# Patient Record
Sex: Female | Born: 1935 | Race: White | Hispanic: No | Smoking: Never smoker
Health system: Southern US, Community
[De-identification: ages and names within clinical notes are randomized; demographics above are authoritative.]

## PROBLEM LIST (undated history)

## (undated) DIAGNOSIS — R011 Cardiac murmur, unspecified: Secondary | ICD-10-CM

## (undated) DIAGNOSIS — I2510 Atherosclerotic heart disease of native coronary artery without angina pectoris: Secondary | ICD-10-CM

## (undated) DIAGNOSIS — M1990 Unspecified osteoarthritis, unspecified site: Secondary | ICD-10-CM

## (undated) DIAGNOSIS — E119 Type 2 diabetes mellitus without complications: Secondary | ICD-10-CM

## (undated) DIAGNOSIS — E785 Hyperlipidemia, unspecified: Secondary | ICD-10-CM

## (undated) DIAGNOSIS — E871 Hypo-osmolality and hyponatremia: Secondary | ICD-10-CM

## (undated) DIAGNOSIS — I442 Atrioventricular block, complete: Secondary | ICD-10-CM

## (undated) DIAGNOSIS — F419 Anxiety disorder, unspecified: Secondary | ICD-10-CM

## (undated) DIAGNOSIS — I639 Cerebral infarction, unspecified: Secondary | ICD-10-CM

## (undated) DIAGNOSIS — I119 Hypertensive heart disease without heart failure: Secondary | ICD-10-CM

## (undated) HISTORY — PX: OTHER SURGICAL HISTORY: SHX169

## (undated) HISTORY — PX: TONSILLECTOMY: SHX5217

## (undated) HISTORY — PX: PACEMAKER INSERTION: SHX728

## (undated) HISTORY — DX: Cerebral infarction, unspecified: I63.9

## (undated) HISTORY — PX: CATARACT EXTRACTION, BILATERAL: SHX1313

## (undated) HISTORY — PX: ABDOMINAL HYSTERECTOMY: SUR658

---

## 1998-01-22 ENCOUNTER — Other Ambulatory Visit: Admission: RE | Admit: 1998-01-22 | Discharge: 1998-01-22 | Payer: Self-pay | Admitting: Family Medicine

## 1999-03-08 ENCOUNTER — Encounter: Payer: Self-pay | Admitting: Orthopedic Surgery

## 1999-03-13 ENCOUNTER — Inpatient Hospital Stay (HOSPITAL_COMMUNITY): Admission: RE | Admit: 1999-03-13 | Discharge: 1999-03-19 | Payer: Self-pay | Admitting: Orthopedic Surgery

## 2000-08-18 ENCOUNTER — Encounter: Payer: Self-pay | Admitting: Family Medicine

## 2000-08-18 ENCOUNTER — Encounter: Admission: RE | Admit: 2000-08-18 | Discharge: 2000-08-18 | Payer: Self-pay | Admitting: Family Medicine

## 2001-04-23 ENCOUNTER — Ambulatory Visit (HOSPITAL_COMMUNITY): Admission: RE | Admit: 2001-04-23 | Discharge: 2001-04-23 | Payer: Self-pay | Admitting: Gastroenterology

## 2003-07-29 HISTORY — PX: OTHER SURGICAL HISTORY: SHX169

## 2003-11-20 ENCOUNTER — Inpatient Hospital Stay (HOSPITAL_COMMUNITY): Admission: RE | Admit: 2003-11-20 | Discharge: 2003-11-23 | Payer: Self-pay | Admitting: Orthopedic Surgery

## 2005-02-18 ENCOUNTER — Inpatient Hospital Stay (HOSPITAL_COMMUNITY): Admission: EM | Admit: 2005-02-18 | Discharge: 2005-02-20 | Payer: Self-pay | Admitting: Emergency Medicine

## 2005-02-18 ENCOUNTER — Ambulatory Visit: Payer: Self-pay | Admitting: Internal Medicine

## 2011-05-07 ENCOUNTER — Inpatient Hospital Stay (HOSPITAL_COMMUNITY)
Admission: EM | Admit: 2011-05-07 | Discharge: 2011-05-10 | DRG: 243 | Disposition: A | Discharge status: Home / Self Care | Payer: Medicare Other | Attending: Cardiology | Admitting: Cardiology

## 2011-05-07 DIAGNOSIS — Z7982 Long term (current) use of aspirin: Secondary | ICD-10-CM

## 2011-05-07 DIAGNOSIS — I442 Atrioventricular block, complete: Secondary | ICD-10-CM | POA: Diagnosis present

## 2011-05-07 DIAGNOSIS — I119 Hypertensive heart disease without heart failure: Secondary | ICD-10-CM

## 2011-05-07 DIAGNOSIS — R350 Frequency of micturition: Secondary | ICD-10-CM | POA: Diagnosis present

## 2011-05-07 DIAGNOSIS — F411 Generalized anxiety disorder: Secondary | ICD-10-CM | POA: Diagnosis present

## 2011-05-07 DIAGNOSIS — E785 Hyperlipidemia, unspecified: Secondary | ICD-10-CM | POA: Diagnosis present

## 2011-05-07 DIAGNOSIS — Z96619 Presence of unspecified artificial shoulder joint: Secondary | ICD-10-CM

## 2011-05-07 DIAGNOSIS — I2510 Atherosclerotic heart disease of native coronary artery without angina pectoris: Secondary | ICD-10-CM | POA: Diagnosis present

## 2011-05-07 DIAGNOSIS — E119 Type 2 diabetes mellitus without complications: Secondary | ICD-10-CM | POA: Diagnosis present

## 2011-05-07 DIAGNOSIS — Z96659 Presence of unspecified artificial knee joint: Secondary | ICD-10-CM

## 2011-05-07 DIAGNOSIS — M1990 Unspecified osteoarthritis, unspecified site: Secondary | ICD-10-CM | POA: Diagnosis present

## 2011-05-07 DIAGNOSIS — Z9849 Cataract extraction status, unspecified eye: Secondary | ICD-10-CM

## 2011-05-07 DIAGNOSIS — I080 Rheumatic disorders of both mitral and aortic valves: Secondary | ICD-10-CM | POA: Diagnosis present

## 2011-05-07 DIAGNOSIS — H5440 Blindness, one eye, unspecified eye: Secondary | ICD-10-CM | POA: Diagnosis present

## 2011-05-07 DIAGNOSIS — E871 Hypo-osmolality and hyponatremia: Secondary | ICD-10-CM

## 2011-05-07 DIAGNOSIS — I4510 Unspecified right bundle-branch block: Secondary | ICD-10-CM | POA: Diagnosis present

## 2011-05-07 DIAGNOSIS — I10 Essential (primary) hypertension: Secondary | ICD-10-CM | POA: Diagnosis present

## 2011-05-07 DIAGNOSIS — K5900 Constipation, unspecified: Secondary | ICD-10-CM | POA: Diagnosis present

## 2011-05-07 DIAGNOSIS — I5181 Takotsubo syndrome: Secondary | ICD-10-CM | POA: Diagnosis present

## 2011-05-07 DIAGNOSIS — Z882 Allergy status to sulfonamides status: Secondary | ICD-10-CM

## 2011-05-07 DIAGNOSIS — Z79899 Other long term (current) drug therapy: Secondary | ICD-10-CM

## 2011-05-07 DIAGNOSIS — Z23 Encounter for immunization: Secondary | ICD-10-CM

## 2011-05-07 DIAGNOSIS — Z8673 Personal history of transient ischemic attack (TIA), and cerebral infarction without residual deficits: Secondary | ICD-10-CM

## 2011-05-07 HISTORY — DX: Unspecified osteoarthritis, unspecified site: M19.90

## 2011-05-07 HISTORY — DX: Atrioventricular block, complete: I44.2

## 2011-05-07 HISTORY — DX: Type 2 diabetes mellitus without complications: E11.9

## 2011-05-07 HISTORY — DX: Cardiac murmur, unspecified: R01.1

## 2011-05-07 HISTORY — DX: Hypo-osmolality and hyponatremia: E87.1

## 2011-05-07 HISTORY — DX: Hypertensive heart disease without heart failure: I11.9

## 2011-05-07 HISTORY — DX: Hyperlipidemia, unspecified: E78.5

## 2011-05-07 HISTORY — DX: Atherosclerotic heart disease of native coronary artery without angina pectoris: I25.10

## 2011-05-07 HISTORY — DX: Anxiety disorder, unspecified: F41.9

## 2011-05-07 LAB — COMPREHENSIVE METABOLIC PANEL
ALT: 123 U/L — ABNORMAL HIGH (ref 0–35)
Alkaline Phosphatase: 74 U/L (ref 39–117)
BUN: 62 mg/dL — ABNORMAL HIGH (ref 6–23)
CO2: 18 mEq/L — ABNORMAL LOW (ref 19–32)
Calcium: 9.2 mg/dL (ref 8.4–10.5)
Chloride: 102 mEq/L (ref 96–112)
GFR calc Af Amer: 41 mL/min — ABNORMAL LOW (ref >90)

## 2011-05-07 LAB — CBC
Hemoglobin: 13.5 g/dL (ref 12.0–15.0)
MCHC: 34.6 g/dL (ref 30.0–36.0)
MCV: 96.3 fL (ref 78.0–100.0)
RBC: 4.05 MIL/uL (ref 3.87–5.11)
RDW: 14.2 % (ref 11.5–15.5)
WBC: 19.8 10*3/uL — ABNORMAL HIGH (ref 4.0–10.5)

## 2011-05-07 LAB — DIFFERENTIAL
Basophils Absolute: 0 10*3/uL (ref 0.0–0.1)
Basophils Relative: 0 % (ref 0–1)
Eosinophils Absolute: 0.2 10*3/uL (ref 0.0–0.7)
Eosinophils Relative: 1 % (ref 0–5)
Lymphocytes Relative: 5 % — ABNORMAL LOW (ref 12–46)
Lymphs Abs: 1 10*3/uL (ref 0.7–4.0)
Monocytes Absolute: 0.7 10*3/uL (ref 0.1–1.0)
Neutrophils Relative %: 91 % — ABNORMAL HIGH (ref 43–77)

## 2011-05-07 LAB — TSH: TSH: 1.693 u[IU]/mL (ref 0.350–4.500)

## 2011-05-07 LAB — PRO B NATRIURETIC PEPTIDE: Pro B Natriuretic peptide (BNP): 9625 pg/mL — ABNORMAL HIGH (ref 0–450)

## 2011-05-08 ENCOUNTER — Encounter (HOSPITAL_COMMUNITY): Payer: Self-pay | Admitting: Cardiology

## 2011-05-08 ENCOUNTER — Inpatient Hospital Stay (HOSPITAL_COMMUNITY): Payer: Medicare Other

## 2011-05-08 DIAGNOSIS — E871 Hypo-osmolality and hyponatremia: Secondary | ICD-10-CM

## 2011-05-08 DIAGNOSIS — E785 Hyperlipidemia, unspecified: Secondary | ICD-10-CM

## 2011-05-08 DIAGNOSIS — R011 Cardiac murmur, unspecified: Secondary | ICD-10-CM

## 2011-05-08 DIAGNOSIS — E119 Type 2 diabetes mellitus without complications: Secondary | ICD-10-CM | POA: Insufficient documentation

## 2011-05-08 DIAGNOSIS — I442 Atrioventricular block, complete: Secondary | ICD-10-CM

## 2011-05-08 DIAGNOSIS — I2510 Atherosclerotic heart disease of native coronary artery without angina pectoris: Secondary | ICD-10-CM

## 2011-05-08 DIAGNOSIS — I119 Hypertensive heart disease without heart failure: Secondary | ICD-10-CM

## 2011-05-08 HISTORY — DX: Hyperlipidemia, unspecified: E78.5

## 2011-05-08 HISTORY — DX: Cardiac murmur, unspecified: R01.1

## 2011-05-08 HISTORY — DX: Atrioventricular block, complete: I44.2

## 2011-05-08 HISTORY — DX: Atherosclerotic heart disease of native coronary artery without angina pectoris: I25.10

## 2011-05-08 HISTORY — DX: Type 2 diabetes mellitus without complications: E11.9

## 2011-05-08 HISTORY — DX: Hypertensive heart disease without heart failure: I11.9

## 2011-05-08 HISTORY — DX: Hypo-osmolality and hyponatremia: E87.1

## 2011-05-08 LAB — HEMOGLOBIN A1C
Hgb A1c MFr Bld: 6.9 % — ABNORMAL HIGH (ref <5.7)
Mean Plasma Glucose: 151 mg/dL — ABNORMAL HIGH (ref <117)

## 2011-05-08 LAB — SURGICAL PCR SCREEN
MRSA, PCR: NEGATIVE
Staphylococcus aureus: NEGATIVE

## 2011-05-08 LAB — TROPONIN I: Troponin I: <0.3 ng/mL (ref <0.30)

## 2011-05-08 LAB — CK TOTAL AND CKMB (NOT AT ARMC)
CK, MB: 2.5 ng/mL (ref 0.3–4.0)
Relative Index: INVALID (ref 0.0–2.5)
Relative Index: INVALID (ref 0.0–2.5)
Total CK: 24 U/L (ref 7–177)

## 2011-05-08 LAB — GLUCOSE, CAPILLARY
Glucose-Capillary: 159 mg/dL — ABNORMAL HIGH (ref 70–99)
Glucose-Capillary: 238 mg/dL — ABNORMAL HIGH (ref 70–99)

## 2011-05-08 LAB — MRSA PCR SCREENING: MRSA by PCR: NEGATIVE

## 2011-05-09 LAB — BASIC METABOLIC PANEL
CO2: 18 mEq/L — ABNORMAL LOW (ref 19–32)
GFR calc non Af Amer: 46 mL/min — ABNORMAL LOW (ref >90)
Glucose, Bld: 162 mg/dL — ABNORMAL HIGH (ref 70–99)

## 2011-05-09 LAB — URINALYSIS, ROUTINE W REFLEX MICROSCOPIC
Nitrite: NEGATIVE
Specific Gravity, Urine: 1.012 (ref 1.005–1.030)
Urobilinogen, UA: 0.2 mg/dL (ref 0.0–1.0)

## 2011-05-09 LAB — URINE MICROSCOPIC-ADD ON

## 2011-05-09 LAB — GLUCOSE, CAPILLARY: Glucose-Capillary: 123 mg/dL — ABNORMAL HIGH (ref 70–99)

## 2011-05-10 ENCOUNTER — Inpatient Hospital Stay (HOSPITAL_COMMUNITY): Payer: Medicare Other

## 2011-05-10 LAB — URINE CULTURE
Colony Count: NO GROWTH
Culture: NO GROWTH

## 2011-05-10 LAB — BASIC METABOLIC PANEL
BUN: 34 mg/dL — ABNORMAL HIGH (ref 6–23)
CO2: 22 mEq/L (ref 19–32)
Calcium: 9 mg/dL (ref 8.4–10.5)
GFR calc non Af Amer: 56 mL/min — ABNORMAL LOW (ref >90)
Glucose, Bld: 163 mg/dL — ABNORMAL HIGH (ref 70–99)
Potassium: 4.7 mEq/L (ref 3.5–5.1)

## 2011-05-18 NOTE — Discharge Summary (Signed)
NAMEMarland Weeks  KINDA, POTTLE NO.:  [id number]  MEDICAL RECORD NO.:  [id number]  LOCATION:  2501                         FACILITY:  [facility]  PHYSICIAN:  Jake Bathe, MD      DATE OF BIRTH:  01/29/1919  DATE OF ADMISSION:  05/07/2011 DATE OF DISCHARGE:                              DISCHARGE SUMMARY   CARDIOLOGIST:  Georga Hacking, M.D.  FINAL DIAGNOSES: 1. Complete heart block. 2. Status post dual-chamber Medtronic pacemaker by Dr. Lewayne Bunting. 3. Hypertension. 4. Type 2 diabetes. 5. Hyperlipidemia. 6. Osteoarthritis. 7. Questionable transient ischemic attack in the past.  PREVIOUS SURGERIES:  Hysterectomy, left knee replacement, right shoulder replacement, and tonsillectomy.  ALLERGIES:  Include SULFA AND LEXAPRO causes insomnia, LIPITOR causes myalgias, CRESTOR causes edema, FLAGYL causes vomiting, CIPRO causes vomiting.  PERTINENT LABS:  Her TSH from October 10, was 1.6, normal.  Troponins were normal.  Sodium 138, potassium 4.7, BUN 34, and creatinine 0.8. Urinalysis unremarkable.  Chest x-ray, postprocedure personally viewed shows dual-chamber pacemaker leads in place.  No evidence of overt pneumothorax, right shoulder replacement noted.  BRIEF HOSPITAL COURSE:  Ms. Lynn Weeks is a 77 year old female seen initially by Dr. Viann Fish in 2006 when she presented with significant T-wave inversions anteriorly and was found to have Takotsubo syndrome.  She only had moderate coronary artery disease by catheterization, and later that year, she had resolution of the wall motion abnormality seen on original echocardiogram.  She presented to Dr. Miguel Rota office with a 2-day history of worsening shortness of breath and was found to be in complete heart block.  She had been on Coreg 40 mg once a day for her previous Takotsubo cardiomyopathy.  This was held but did not resolve her high-degree AV block/complete heart block.  Because of this, Dr. Lewayne Bunting  brought her in for a pacemaker implantation which was done successfully on October 12, Medtronic.  On day of discharge, wound dressing was removed and pacemaker site looked clean, dry, and intact.  Chest x-ray showed no pneumothorax, and her discharge medications were resumed to her home medications including her Coreg.  DISCHARGE MEDICATIONS: 1. Amlodipine 5 mg once a day. 2. Aspirin 81 mg a day. 3. Amitiza 24 mcg once a day. 4. Coreg CR 40 mg a day. 5. Furosemide 1/2 to 1 tablet of 20 mg only when she feels "full"     twice a day as needed. 6. Lisinopril 40 mg a day. 7. Metformin 500 mg 1-1/2 tablets twice a day. 8. Ofloxacin otic 0.3% drops in right eye as needed. 9. Simvastatin 20 mg daily at bedtime. 10.Diazepam 5 mg 1/2 tablet daily at bedtime as needed.  NEW MEDICATIONS:  No new medications were added on this admission.  Pacemaker was interrogated this morning by Medtronic and functioning normally.  Echocardiogram done on this admission showed marked mitral annular calcification and aortic valve calcification with moderate MR and normal EF.  She feels well, ambulating well, blood pressure 144/50 to 136/50, saturating 96% on room air.  She is afebrile 98.5, pulse is currently being paced in the 70 range.  DISCHARGE INSTRUCTIONS:  She will have a followup in 1 week for wound check at Dr.  Olga Millers electrophysiology clinic, and she will have an appointment with Dr. Viann Fish in 2 Weeks.  She knows to contact us if any further worrisome symptoms develop.     Jake Bathe, MD     MCS/MEDQ  D:  05/10/2011  T:  05/10/2011  Job:  [id number]  Electronically Signed by Donato Schultz MD on 05/18/2011 07:54:42 AM

## 2011-05-19 ENCOUNTER — Encounter: Payer: Self-pay | Admitting: Internal Medicine

## 2011-05-19 ENCOUNTER — Ambulatory Visit (INDEPENDENT_AMBULATORY_CARE_PROVIDER_SITE_OTHER): Payer: Medicare Other | Admitting: *Deleted

## 2011-05-19 DIAGNOSIS — I442 Atrioventricular block, complete: Secondary | ICD-10-CM

## 2011-05-19 LAB — PACEMAKER DEVICE OBSERVATION
AL AMPLITUDE: 2 mv
BAMS-0001: 150 {beats}/min
BATTERY VOLTAGE: 2.79 V
RV LEAD AMPLITUDE: 15.68 mv
RV LEAD IMPEDENCE PM: 572 Ohm

## 2011-05-19 NOTE — H&P (Signed)
[name], Weeks            ACCOUNT NO.:  [id number]  MEDICAL RECORD NO.:  [id number]  LOCATION:  [facility]                         FACILITY:  [facility]  PHYSICIAN:  Lynn Weeks, M.D.DATE OF BIRTH:  1919/01/20  DATE OF ADMISSION:  05/07/2011                              HISTORY & PHYSICAL   REASON FOR ADMISSION:  Heart block.  HISTORY OF PRESENT ILLNESS:  This very nice 77 year old female was seen by me initially in 2006, when she presented to the hospital with nausea, vomiting, hyponatremia, and had significant T-wave inversions anteriorly and was found have Takotsubo syndrome.  Catheterization showed only moderate coronary artery disease, and a subsequent echocardiogram done later that year showed resolution of the wall motion abnormality.  She had a 40% circumflex stenosis at the time and scattered irregularities in the right coronary artery.  She has done well since that time and currently lives at home alone and still lives independently and is able to drive and do housework without symptoms.  She was just seen by her primary doctor about 10 days ago, at which time, she actually was feeling well and doing fine.  She presented to Dr. Miguel Weeks office this afternoon with a 2-day history of exercise intolerance and worsening shortness of breath that started yesterday.  She has not had any significant dizziness, but did fall out of the bed the other night and may have bruised her hip.  She has mild chronic edema.  She has mild chronic kidney disease previously.  She described hurting all over today and just feeling weak when she was at Dr. Miguel Weeks office.  She recently had simvastatin reduced.  She was found to be in complete heart block with an escape rate narrow complex of 30 and is admitted at this time for further evaluation.  She has a normal blood pressure.  PAST MEDICAL HISTORY:  Medical, she has type 2 diabetes mellitus, hypertension, hyperlipidemia,  osteoarthritis, and a questionable transient TIA in the past.  Previous surgery, hysterectomy, left knee replacement, right shoulder surgery, and tonsillectomy.  ALLERGIES:  SULFA.  LEXAPRO CAUSES INSOMNIA.  LIPITOR CAUSES MYALGIAS. CRESTOR CAUSES EDEMA.  FLAGYL CAUSES VOMITING.  CIPRO CAUSES VOMITING.  FAMILY HISTORY:  Father died of dementia and heart trouble.  Mother died of hypertension, heart disease, and stroke.  One sister has had lung cancer.  SOCIAL HISTORY:  She is a widow and has 1 son.  There is a nonsmoker and does not use alcohol to excess.  She currently maintains her own home.  REVIEW OF SYSTEMS:  Her weight has been stable.  She just had cataract surgery.  Vision is reasonable.  Some mild decreased hearing, complains of some anxiety.  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  She has mild numbness in her feet.  She complains of insomnia.  No dyspnea up until the past 2 days.  She normally has some constipation.  She does not have recent diarrhea.  She has urinary frequency.  She has arthritis that involves her left knee as well as her shoulder, and is otherwise generalized.  Other than as noted above, the remainder of the review of systems is unremarkable.  PHYSICAL  EXAMINATION:  GENERAL:  She is a pleasant, thin-framed white female who is currently in no acute distress. VITAL SIGNS:  Her blood pressure is 135/40, and regular.  Her pulse was 30 and regular.  Respirations were 12 and quiet. SKIN:  Warm and dry.  No obvious mass, lesions, or trauma. HEENT:  EOMI.  PERRLA.  There is clouding of the right eye.  The left eye conjunctiva and sclera are clear.  Mouth is negative. NECK:  Supple.  No masses, JVD, or thyromegaly.  No definite bruits. LUNGS:  Clear. CARDIAC EXAM:  There is a 2/6 systolic murmur at the aortic area.  There is no diastolic murmur noted.  No S3 is noted.  Rate is slow. ABDOMEN:  Soft and nontender.  No  aneurysm is felt.  Femoral pulses are 2+.  Dorsalis pedis is 2+ on the right.  It has been difficult to feel on the left.  No edema is present. NEUROLOGIC:  Shows her to be alert and oriented x3.  Cranial nerves grossly intact.  Sensory and motor are grossly normal.  Motor and cerebellar were not able to be formally tested.  DIAGNOSTIC DATA:  A 12-lead EKG shows third degree heart block with a right bundle branch block pattern, right axis deviation.  IMPRESSION: 1. Complete heart block. 2. Hypertensive heart disease. 3. History of Takotsubo syndrome. 4. Non-insulin dependent diabetes. 5. Hyperlipidemia, under treatment. 6. History of anxiety. 7. Osteoarthritis.  RECOMMENDATIONS:  Her carvedilol will be held.  Her amlodipine will be held at the present time.  We will continue her other medications that do not have any effect on her AV node.  I will get a TSH and place her in telemetry on the step-down unit and check an echocardiogram.  EP consultation likely will require permanent pacemaker.     Lynn Weeks, M.D.     WST/MEDQ  D:  05/07/2011  T:  05/07/2011  Job:  [id number]  cc:   Lynn Blanks, MD Lynn Range, MD  Electronically Signed by Lynn Weeks. Lynn Weeks M.D. on 05/19/2011 11:59:02 AM

## 2011-05-19 NOTE — Progress Notes (Signed)
Pacer check in clinic  

## 2011-05-25 NOTE — Op Note (Signed)
  NAMEMEYGAN, Lynn Weeks            ACCOUNT NO.:  [id number]  MEDICAL RECORD NO.:  [id number]  LOCATION:  2501                         FACILITY:  [facility]  PHYSICIAN:  Doylene Canning. Ladona Ridgel, MD    DATE OF BIRTH:  June 02, 1919  DATE OF PROCEDURE:  05/09/2011 DATE OF DISCHARGE:                              OPERATIVE REPORT   PROCEDURE PERFORMED:  Insertion of a dual-chamber pacemaker.  INDICATION:  Symptomatic complete heart block.  INTRODUCTION:  The patient is a 77 year old woman who subsequently developed complete heart block on beta-blockers.  Her medications were discontinued, but her heart block persisted.  She is now referred for permanent pacemaker insertion.  PROCEDURE:  After informed consent was obtained, the patient was taken to the diagnostic EP lab in a fasting state.  After usual preparation and draping, intravenous fentanyl and midazolam were given for sedation. Lidocaine 30 mL was infiltrated into the left infraclavicular region.  A 5-cm incision was carried out over this region and electrocautery was utilized to dissect down to the fascial plane.  The left subclavian vein was punctured x2 and a Medtronic model 5076, 52-cm active fixation pacing lead, serial number [id number] being advanced into the right ventricle and the Medtronic model 5076, 45-cm active fixation pacing lead, serial number [id number] being advanced to the right atrium. Mapping was carried out in the right ventricle.  At the final site, the R-waves measured 10 mV, the pacing impedance was a 1000 ohms, and the threshold was less than a volt at 0.5 msec.  There was a large injury current present.  10 V pacing did not stimulate the diaphragm.  With the ventricular lead in satisfactory position, attention was then turned to placement of the atrial lead which was placed in anterolateral portion of the right atrium.  The P-waves measured 5 mV, the pacing impedance with lead actively fixed was 690 ohms, and the  threshold was a volt at 0.5 msec.  Again, 10 V pacing did not stimulate the diaphragm and again, an injury current was present.  With these satisfactory parameters, the leads were secured to the subpectoral fascia with a figure-of-eight silk suture.  The sewing sleeve was secured with silk suture.  Electrocautery was utilized to make subcutaneous pocket.  Antibiotic irrigation was utilized to irrigate the pocket and electrocautery was utilized to assure hemostasis.  A Medtronic Sensia dual-chamber pacemaker, serial number [id number] was connected to the atrial and ventricular leads and placed back in the subcutaneous pocket.  The pocket was secured with silk suture.  Pocket was irrigated with antibiotic irrigation and the incision closed with 2-0 and 3-0 Vicryl.  Benzoin and Steri-Strips were painted on the skin, pressure dressing applied, and the patient was returned to her room in satisfactory condition.  COMPLICATIONS:  There were no immediate procedure complications.  RESULTS:  These demonstrate successful implantation of a Medtronic dual- chamber pacemaker in a patient with symptomatic complete heart block.     Doylene Canning. Ladona Ridgel, MD     GWT/MEDQ  D:  05/09/2011  T:  05/09/2011  Job:  [id number]  cc:   Georga Hacking, M.D.  Electronically Signed by Lewayne Bunting MD on 05/25/2011 12:17:30 PM

## 2011-05-29 NOTE — Consult Note (Signed)
NAMEMarland Weeks  YARDLEY, BELTRAN NO.:  [id number]  MEDICAL RECORD NO.:  [id number]  LOCATION:  2918                         FACILITY:  [facility]  PHYSICIAN:  Hillis Range, MD       DATE OF BIRTH:  1919-04-02  DATE OF CONSULTATION: DATE OF DISCHARGE:                                CONSULTATION   REQUESTING PHYSICIAN:  Georga Hacking, MD.  REASON FOR CONSULTATION:  Heart block.  HPI:  [name] is a pleasant 77 year old female with a history of diabetes, hypertension, and arthritis, who presents with symptoms of fatigue and decreased exercise tolerance.  She reports that over the past 3 days, she has had an abrupt decline in her exercise tolerance. She reports that she just "felt bad."  She reports fatigue.  She reports that with ambulation, she was dizzy and lightheaded and mildly short of breath.  When her symptoms did not resolve, she presented to [facility] Emergency Department.  She was found to have complete heart block with a ventricular rate in the 30s with hemodynamically stable.  She chronically takes Coreg CR and reports that her last dose was on the evening of October 9.  She denies taking any additional doses.  She is otherwise without complaint.  She denies chest pain, palpitations, presyncope, or syncope.  PAST MEDICAL HISTORY: 1. History of toxic cardiomyopathy in 2006, subsequently improved. 2. Nonobstructive coronary disease by cath in 2006. 3. Hypertension. 4. Hyperlipidemia. 5. Diabetes. 6. Arthritis. 7 .  Prior TIA.  PAST SURGICAL HISTORY: 1. Hysterectomy. 2. Left knee replacement. 3. Right shoulder surgery. 4. Tonsillectomy.  MEDICATIONS:  Reviewed in the [facility].  ALLERGIES:  SULFA, LEXAPRO, LIPITOR, CRESTOR, FLAGYL, AND CIPRO.  SOCIAL HISTORY:  The patient is widowed.  She lives alone and remains quite active.  She denies tobacco, alcohol, or drug use.  FAMILY HISTORY:  Notable for dementia and hypertension.  REVIEW OF SYSTEMS:  All  systems reviewed and negative except as outlined in the HPI above.  PHYSICAL EXAM:  TELEMETRY: Presently reveals Mobitz II second-degree AV block with 2 to 1 AV conduction.  Overnight, she has had complete heart block.  Her ventricular rates in the 30s. VITAL SIGNS: Blood pressure 150/37, heart rate 34, respirations 18, sats 91%, afebrile. GENERAL: The patient is a pleasant elderly female, in no acute distress. She is alert and oriented x3.  HEENT: Normocephalic and atraumatic. Sclerae clear.  Conjunctivae pink.  Oropharynx clear. NECK: Supple. LUNGS: Clear. HEART: Bradycardic, regular rhythm with a 2/6 systolic ejection murmur over the left sternal border. GI: Soft, nontender, nondistended.  Positive bowel sounds. EXTREMITIES: No clubbing, cyanosis, or edema. SKIN: No ecchymoses or lacerations. MUSCULOSKELETAL: She has age appropriate atrophy. PSYCH: Euthymic mood full affect.  DIAGNOSTIC DATA:  EKG:  I reviewed her EKGs from October 10, which revealed complete heart block with a right bundle-branch, posterior fascicular escape, QRS duration 110 msec and a QTc of 120.  Echocardiogram is currently pending.  Chest x-ray reveals no acute airspace disease.  She has aortic calcification.  LABS:  Cardiac markers are negative.  TSH 1.6, potassium 5.0, creatinine 1.3, glucose 244, INR 1.1, white blood cell count 19.8, hematocrit 39, platelets 497.  IMPRESSION:  Ms. Lynn Weeks is a pleasant 77 year old female who now presents with complete heart block.  She has previously been treated with Coreg CR 40 mg daily.  Her last dose was on the p.m. of October 9. With holding this medication thus far, her AV conduction has improved to 2 to 1 AV block.  I therefore think that we should watch her for another 24 hours.  If she has complete resolution in her AV block, then we may be able to avoid a pacemaker.  However, if she continues to have intermittent Mobitz II conduction at that point, then I  think that the most prudent option would be to proceed with pacemaker implantation.  I have discussed this plan with Dr. Donnie Aho who is in agreement with this. I have also had a long discussion with the patient and her family.  They understand the risks, benefits, and alternatives to pacemaker implantation.  They are aware that the risk include, but are not limited to infection, bleeding, vascular damage, perforation, tamponade, pneumothorax, renal failure, stroke, MI, lead dislodgement, and death. They accept these risks and wish to proceed.  We will therefore plan for pacemaker implantation tomorrow if her AV conduction does not return.     Hillis Range, MD     JA/MEDQ  D:  05/08/2011  T:  05/08/2011  Job:  [id number]  Electronically Signed by Hillis Range MD on 05/29/2011 02:34:03 PM

## 2011-08-08 DIAGNOSIS — Z950 Presence of cardiac pacemaker: Secondary | ICD-10-CM | POA: Diagnosis not present

## 2011-08-08 DIAGNOSIS — I252 Old myocardial infarction: Secondary | ICD-10-CM | POA: Diagnosis not present

## 2011-08-08 DIAGNOSIS — I2510 Atherosclerotic heart disease of native coronary artery without angina pectoris: Secondary | ICD-10-CM | POA: Diagnosis not present

## 2011-08-08 DIAGNOSIS — E785 Hyperlipidemia, unspecified: Secondary | ICD-10-CM | POA: Diagnosis not present

## 2011-08-08 DIAGNOSIS — E119 Type 2 diabetes mellitus without complications: Secondary | ICD-10-CM | POA: Diagnosis not present

## 2011-08-08 DIAGNOSIS — I10 Essential (primary) hypertension: Secondary | ICD-10-CM | POA: Diagnosis not present

## 2011-08-27 DIAGNOSIS — I10 Essential (primary) hypertension: Secondary | ICD-10-CM | POA: Diagnosis not present

## 2011-08-27 DIAGNOSIS — E119 Type 2 diabetes mellitus without complications: Secondary | ICD-10-CM | POA: Diagnosis not present

## 2011-08-27 DIAGNOSIS — E785 Hyperlipidemia, unspecified: Secondary | ICD-10-CM | POA: Diagnosis not present

## 2011-08-27 DIAGNOSIS — R002 Palpitations: Secondary | ICD-10-CM | POA: Diagnosis not present

## 2011-08-27 DIAGNOSIS — I252 Old myocardial infarction: Secondary | ICD-10-CM | POA: Diagnosis not present

## 2011-08-27 DIAGNOSIS — I2510 Atherosclerotic heart disease of native coronary artery without angina pectoris: Secondary | ICD-10-CM | POA: Diagnosis not present

## 2011-08-27 DIAGNOSIS — Z950 Presence of cardiac pacemaker: Secondary | ICD-10-CM | POA: Diagnosis not present

## 2011-08-29 DIAGNOSIS — E119 Type 2 diabetes mellitus without complications: Secondary | ICD-10-CM | POA: Diagnosis not present

## 2011-08-29 DIAGNOSIS — E782 Mixed hyperlipidemia: Secondary | ICD-10-CM | POA: Diagnosis not present

## 2011-08-29 DIAGNOSIS — Z79899 Other long term (current) drug therapy: Secondary | ICD-10-CM | POA: Diagnosis not present

## 2011-09-02 DIAGNOSIS — E119 Type 2 diabetes mellitus without complications: Secondary | ICD-10-CM | POA: Diagnosis not present

## 2011-09-02 DIAGNOSIS — E782 Mixed hyperlipidemia: Secondary | ICD-10-CM | POA: Diagnosis not present

## 2011-09-02 DIAGNOSIS — I10 Essential (primary) hypertension: Secondary | ICD-10-CM | POA: Diagnosis not present

## 2011-09-12 ENCOUNTER — Telehealth: Payer: Self-pay | Admitting: Internal Medicine

## 2011-09-12 NOTE — Telephone Encounter (Signed)
09-12-11 Pt having device checked by dr York Spaniel office/mt

## 2011-09-26 ENCOUNTER — Encounter: Payer: Self-pay | Admitting: *Deleted

## 2011-10-28 DIAGNOSIS — H2510 Age-related nuclear cataract, unspecified eye: Secondary | ICD-10-CM | POA: Diagnosis not present

## 2011-11-07 DIAGNOSIS — Z950 Presence of cardiac pacemaker: Secondary | ICD-10-CM | POA: Diagnosis not present

## 2011-11-07 DIAGNOSIS — I10 Essential (primary) hypertension: Secondary | ICD-10-CM | POA: Diagnosis not present

## 2011-11-07 DIAGNOSIS — I252 Old myocardial infarction: Secondary | ICD-10-CM | POA: Diagnosis not present

## 2011-11-07 DIAGNOSIS — I2510 Atherosclerotic heart disease of native coronary artery without angina pectoris: Secondary | ICD-10-CM | POA: Diagnosis not present

## 2011-11-07 DIAGNOSIS — R002 Palpitations: Secondary | ICD-10-CM | POA: Diagnosis not present

## 2011-11-07 DIAGNOSIS — E119 Type 2 diabetes mellitus without complications: Secondary | ICD-10-CM | POA: Diagnosis not present

## 2011-11-07 DIAGNOSIS — E785 Hyperlipidemia, unspecified: Secondary | ICD-10-CM | POA: Diagnosis not present

## 2011-12-02 DIAGNOSIS — H10029 Other mucopurulent conjunctivitis, unspecified eye: Secondary | ICD-10-CM | POA: Diagnosis not present

## 2011-12-24 DIAGNOSIS — H18839 Recurrent erosion of cornea, unspecified eye: Secondary | ICD-10-CM | POA: Diagnosis not present

## 2011-12-31 DIAGNOSIS — H18839 Recurrent erosion of cornea, unspecified eye: Secondary | ICD-10-CM | POA: Diagnosis not present

## 2012-01-05 DIAGNOSIS — E119 Type 2 diabetes mellitus without complications: Secondary | ICD-10-CM | POA: Diagnosis not present

## 2012-01-05 DIAGNOSIS — E782 Mixed hyperlipidemia: Secondary | ICD-10-CM | POA: Diagnosis not present

## 2012-01-05 DIAGNOSIS — I10 Essential (primary) hypertension: Secondary | ICD-10-CM | POA: Diagnosis not present

## 2012-01-05 DIAGNOSIS — R609 Edema, unspecified: Secondary | ICD-10-CM | POA: Diagnosis not present

## 2012-01-14 DIAGNOSIS — H18839 Recurrent erosion of cornea, unspecified eye: Secondary | ICD-10-CM | POA: Diagnosis not present

## 2012-02-09 DIAGNOSIS — Z950 Presence of cardiac pacemaker: Secondary | ICD-10-CM | POA: Diagnosis not present

## 2012-02-09 DIAGNOSIS — R002 Palpitations: Secondary | ICD-10-CM | POA: Diagnosis not present

## 2012-02-09 DIAGNOSIS — E785 Hyperlipidemia, unspecified: Secondary | ICD-10-CM | POA: Diagnosis not present

## 2012-02-09 DIAGNOSIS — I10 Essential (primary) hypertension: Secondary | ICD-10-CM | POA: Diagnosis not present

## 2012-02-09 DIAGNOSIS — I2510 Atherosclerotic heart disease of native coronary artery without angina pectoris: Secondary | ICD-10-CM | POA: Diagnosis not present

## 2012-02-09 DIAGNOSIS — E119 Type 2 diabetes mellitus without complications: Secondary | ICD-10-CM | POA: Diagnosis not present

## 2012-02-09 DIAGNOSIS — I252 Old myocardial infarction: Secondary | ICD-10-CM | POA: Diagnosis not present

## 2012-02-23 DIAGNOSIS — I2510 Atherosclerotic heart disease of native coronary artery without angina pectoris: Secondary | ICD-10-CM | POA: Diagnosis not present

## 2012-02-23 DIAGNOSIS — E785 Hyperlipidemia, unspecified: Secondary | ICD-10-CM | POA: Diagnosis not present

## 2012-02-23 DIAGNOSIS — R002 Palpitations: Secondary | ICD-10-CM | POA: Diagnosis not present

## 2012-02-23 DIAGNOSIS — Z950 Presence of cardiac pacemaker: Secondary | ICD-10-CM | POA: Diagnosis not present

## 2012-02-23 DIAGNOSIS — I252 Old myocardial infarction: Secondary | ICD-10-CM | POA: Diagnosis not present

## 2012-02-23 DIAGNOSIS — E119 Type 2 diabetes mellitus without complications: Secondary | ICD-10-CM | POA: Diagnosis not present

## 2012-02-23 DIAGNOSIS — I10 Essential (primary) hypertension: Secondary | ICD-10-CM | POA: Diagnosis not present

## 2012-03-20 ENCOUNTER — Inpatient Hospital Stay (HOSPITAL_COMMUNITY)
Admission: EM | Admit: 2012-03-20 | Discharge: 2012-03-23 | DRG: 065 | Disposition: A | Discharge status: Rehabilitation Facility | Payer: Medicare Other | Attending: Internal Medicine | Admitting: Internal Medicine

## 2012-03-20 ENCOUNTER — Emergency Department (HOSPITAL_COMMUNITY): Payer: Medicare Other

## 2012-03-20 ENCOUNTER — Encounter (HOSPITAL_COMMUNITY): Payer: Self-pay | Admitting: Emergency Medicine

## 2012-03-20 DIAGNOSIS — I442 Atrioventricular block, complete: Secondary | ICD-10-CM

## 2012-03-20 DIAGNOSIS — R51 Headache: Secondary | ICD-10-CM | POA: Diagnosis not present

## 2012-03-20 DIAGNOSIS — R55 Syncope and collapse: Secondary | ICD-10-CM | POA: Diagnosis not present

## 2012-03-20 DIAGNOSIS — R269 Unspecified abnormalities of gait and mobility: Secondary | ICD-10-CM | POA: Diagnosis not present

## 2012-03-20 DIAGNOSIS — I509 Heart failure, unspecified: Secondary | ICD-10-CM | POA: Diagnosis not present

## 2012-03-20 DIAGNOSIS — W19XXXA Unspecified fall, initial encounter: Secondary | ICD-10-CM | POA: Diagnosis present

## 2012-03-20 DIAGNOSIS — I6789 Other cerebrovascular disease: Secondary | ICD-10-CM | POA: Diagnosis not present

## 2012-03-20 DIAGNOSIS — R5383 Other fatigue: Secondary | ICD-10-CM | POA: Diagnosis not present

## 2012-03-20 DIAGNOSIS — R279 Unspecified lack of coordination: Secondary | ICD-10-CM | POA: Diagnosis not present

## 2012-03-20 DIAGNOSIS — N183 Chronic kidney disease, stage 3 unspecified: Secondary | ICD-10-CM | POA: Diagnosis not present

## 2012-03-20 DIAGNOSIS — I2510 Atherosclerotic heart disease of native coronary artery without angina pectoris: Secondary | ICD-10-CM | POA: Diagnosis not present

## 2012-03-20 DIAGNOSIS — H5440 Blindness, one eye, unspecified eye: Secondary | ICD-10-CM | POA: Diagnosis not present

## 2012-03-20 DIAGNOSIS — R42 Dizziness and giddiness: Secondary | ICD-10-CM | POA: Diagnosis present

## 2012-03-20 DIAGNOSIS — J9819 Other pulmonary collapse: Secondary | ICD-10-CM | POA: Diagnosis not present

## 2012-03-20 DIAGNOSIS — N179 Acute kidney failure, unspecified: Secondary | ICD-10-CM | POA: Diagnosis present

## 2012-03-20 DIAGNOSIS — E119 Type 2 diabetes mellitus without complications: Secondary | ICD-10-CM | POA: Diagnosis not present

## 2012-03-20 DIAGNOSIS — I10 Essential (primary) hypertension: Secondary | ICD-10-CM | POA: Diagnosis not present

## 2012-03-20 DIAGNOSIS — R531 Weakness: Secondary | ICD-10-CM

## 2012-03-20 DIAGNOSIS — I6350 Cerebral infarction due to unspecified occlusion or stenosis of unspecified cerebral artery: Secondary | ICD-10-CM | POA: Diagnosis not present

## 2012-03-20 DIAGNOSIS — E7889 Other lipoprotein metabolism disorders: Secondary | ICD-10-CM | POA: Diagnosis not present

## 2012-03-20 DIAGNOSIS — I6330 Cerebral infarction due to thrombosis of unspecified cerebral artery: Secondary | ICD-10-CM | POA: Diagnosis not present

## 2012-03-20 DIAGNOSIS — R404 Transient alteration of awareness: Secondary | ICD-10-CM | POA: Diagnosis not present

## 2012-03-20 DIAGNOSIS — Z950 Presence of cardiac pacemaker: Secondary | ICD-10-CM | POA: Diagnosis not present

## 2012-03-20 DIAGNOSIS — E785 Hyperlipidemia, unspecified: Secondary | ICD-10-CM | POA: Diagnosis present

## 2012-03-20 DIAGNOSIS — R011 Cardiac murmur, unspecified: Secondary | ICD-10-CM

## 2012-03-20 DIAGNOSIS — I129 Hypertensive chronic kidney disease with stage 1 through stage 4 chronic kidney disease, or unspecified chronic kidney disease: Secondary | ICD-10-CM | POA: Diagnosis present

## 2012-03-20 DIAGNOSIS — R270 Ataxia, unspecified: Secondary | ICD-10-CM

## 2012-03-20 DIAGNOSIS — D72829 Elevated white blood cell count, unspecified: Secondary | ICD-10-CM | POA: Diagnosis not present

## 2012-03-20 DIAGNOSIS — R918 Other nonspecific abnormal finding of lung field: Secondary | ICD-10-CM | POA: Diagnosis not present

## 2012-03-20 DIAGNOSIS — Z7982 Long term (current) use of aspirin: Secondary | ICD-10-CM

## 2012-03-20 DIAGNOSIS — R0602 Shortness of breath: Secondary | ICD-10-CM | POA: Diagnosis not present

## 2012-03-20 DIAGNOSIS — R5381 Other malaise: Secondary | ICD-10-CM | POA: Diagnosis not present

## 2012-03-20 DIAGNOSIS — Z96659 Presence of unspecified artificial knee joint: Secondary | ICD-10-CM | POA: Diagnosis not present

## 2012-03-20 DIAGNOSIS — I119 Hypertensive heart disease without heart failure: Secondary | ICD-10-CM | POA: Diagnosis not present

## 2012-03-20 DIAGNOSIS — Z5189 Encounter for other specified aftercare: Secondary | ICD-10-CM | POA: Diagnosis not present

## 2012-03-20 DIAGNOSIS — I639 Cerebral infarction, unspecified: Secondary | ICD-10-CM | POA: Insufficient documentation

## 2012-03-20 DIAGNOSIS — E871 Hypo-osmolality and hyponatremia: Secondary | ICD-10-CM

## 2012-03-20 LAB — BASIC METABOLIC PANEL
BUN: 23 mg/dL (ref 6–23)
Creatinine, Ser: 0.92 mg/dL (ref 0.50–1.10)
GFR calc Af Amer: 61 mL/min — ABNORMAL LOW (ref >90)
GFR calc non Af Amer: 53 mL/min — ABNORMAL LOW (ref >90)
Glucose, Bld: 239 mg/dL — ABNORMAL HIGH (ref 70–99)

## 2012-03-20 LAB — GLUCOSE, CAPILLARY: Glucose-Capillary: 169 mg/dL — ABNORMAL HIGH (ref 70–99)

## 2012-03-20 LAB — CBC
MCH: 30.6 pg (ref 26.0–34.0)
MCV: 90.9 fL (ref 78.0–100.0)
Platelets: 381 10*3/uL (ref 150–400)
RDW: 13.6 % (ref 11.5–15.5)

## 2012-03-20 LAB — CBC WITH DIFFERENTIAL/PLATELET
Basophils Relative: 0 % (ref 0–1)
Eosinophils Absolute: 0.2 10*3/uL (ref 0.0–0.7)
Eosinophils Relative: 1 % (ref 0–5)
HCT: 41.8 % (ref 36.0–46.0)
Hemoglobin: 14.1 g/dL (ref 12.0–15.0)
Lymphs Abs: 1.3 10*3/uL (ref 0.7–4.0)
MCH: 30.9 pg (ref 26.0–34.0)
MCHC: 33.7 g/dL (ref 30.0–36.0)
MCV: 91.5 fL (ref 78.0–100.0)
Monocytes Absolute: 0.4 10*3/uL (ref 0.1–1.0)
Monocytes Relative: 2 % — ABNORMAL LOW (ref 3–12)
RBC: 4.57 MIL/uL (ref 3.87–5.11)

## 2012-03-20 LAB — URINALYSIS, ROUTINE W REFLEX MICROSCOPIC
Bilirubin Urine: NEGATIVE
Hgb urine dipstick: NEGATIVE
Ketones, ur: NEGATIVE mg/dL
Nitrite: NEGATIVE
Urobilinogen, UA: 0.2 mg/dL (ref 0.0–1.0)

## 2012-03-20 LAB — URINE MICROSCOPIC-ADD ON

## 2012-03-20 LAB — CARDIAC PANEL(CRET KIN+CKTOT+MB+TROPI): Relative Index: INVALID (ref 0.0–2.5)

## 2012-03-20 MED ORDER — ASPIRIN 81 MG PO TABS: 81.0000 mg | ORAL_TABLET | Freq: Every day | ORAL | Status: DC

## 2012-03-20 MED ORDER — SENNOSIDES-DOCUSATE SODIUM 8.6-50 MG PO TABS
1.0000 | ORAL_TABLET | Freq: Every evening | ORAL | Status: DC | PRN
  Administered 2012-03-23: 1 via ORAL
  Filled 2012-03-20: qty 1

## 2012-03-20 MED ORDER — SIMVASTATIN 20 MG PO TABS
20.0000 mg | ORAL_TABLET | Freq: Every day | ORAL | Status: DC
  Administered 2012-03-20 – 2012-03-22 (×3): 20 mg via ORAL
  Filled 2012-03-20 (×4): qty 1

## 2012-03-20 MED ORDER — INSULIN GLARGINE 100 UNIT/ML ~~LOC~~ SOLN: 5.0000 [IU] | Freq: Every day | SUBCUTANEOUS | Status: DC

## 2012-03-20 MED ORDER — OFLOXACIN 0.3 % OP SOLN
1.0000 [drp] | Freq: Every day | OPHTHALMIC | Status: DC
  Administered 2012-03-21 – 2012-03-23 (×3): 1 [drp] via OPHTHALMIC
  Filled 2012-03-20: qty 5

## 2012-03-20 MED ORDER — ONDANSETRON HCL 4 MG/2ML IJ SOLN
4.0000 mg | Freq: Four times a day (QID) | INTRAMUSCULAR | Status: DC | PRN
  Administered 2012-03-20 – 2012-03-21 (×3): 4 mg via INTRAVENOUS
  Filled 2012-03-20 (×2): qty 2

## 2012-03-20 MED ORDER — SODIUM CHLORIDE 0.9 % IV SOLN: INTRAVENOUS | Status: DC

## 2012-03-20 MED ORDER — HEPARIN SODIUM (PORCINE) 5000 UNIT/ML IJ SOLN
5000.0000 [IU] | Freq: Three times a day (TID) | INTRAMUSCULAR | Status: DC
  Administered 2012-03-23: 5000 [IU] via SUBCUTANEOUS
  Filled 2012-03-20 (×11): qty 1

## 2012-03-20 MED ORDER — ACETAMINOPHEN 325 MG PO TABS
650.0000 mg | ORAL_TABLET | Freq: Four times a day (QID) | ORAL | Status: DC | PRN
  Administered 2012-03-21: 650 mg via ORAL
  Filled 2012-03-20: qty 2

## 2012-03-20 MED ORDER — HEPARIN SODIUM (PORCINE) 5000 UNIT/ML IJ SOLN: 5000.0000 [IU] | Freq: Three times a day (TID) | INTRAMUSCULAR | Status: DC

## 2012-03-20 MED ORDER — INSULIN ASPART 100 UNIT/ML ~~LOC~~ SOLN: 0.0000 [IU] | Freq: Three times a day (TID) | SUBCUTANEOUS | Status: DC

## 2012-03-20 MED ORDER — CARVEDILOL PHOSPHATE ER 40 MG PO CP24
40.0000 mg | ORAL_CAPSULE | Freq: Every day | ORAL | Status: DC
  Administered 2012-03-21 – 2012-03-23 (×3): 40 mg via ORAL
  Filled 2012-03-20 (×3): qty 1

## 2012-03-20 MED ORDER — POLYETHYLENE GLYCOL 3350 17 G PO PACK
17.0000 g | PACK | Freq: Every day | ORAL | Status: DC | PRN
  Filled 2012-03-20: qty 1

## 2012-03-20 MED ORDER — ACETAMINOPHEN 650 MG RE SUPP: 650.0000 mg | Freq: Four times a day (QID) | RECTAL | Status: DC | PRN

## 2012-03-20 MED ORDER — ONDANSETRON HCL 4 MG PO TABS: 4.0000 mg | ORAL_TABLET | Freq: Four times a day (QID) | ORAL | Status: DC | PRN

## 2012-03-20 MED ORDER — SODIUM CHLORIDE 0.9 % IV SOLN
INTRAVENOUS | Status: DC
  Administered 2012-03-20: 13:00:00 via INTRAVENOUS

## 2012-03-20 MED ORDER — INSULIN ASPART 100 UNIT/ML ~~LOC~~ SOLN: 0.0000 [IU] | Freq: Every day | SUBCUTANEOUS | Status: DC

## 2012-03-20 MED ORDER — ASPIRIN 300 MG RE SUPP
300.0000 mg | Freq: Every day | RECTAL | Status: DC
  Filled 2012-03-20 (×3): qty 1

## 2012-03-20 MED ORDER — ASPIRIN 325 MG PO TABS
325.0000 mg | ORAL_TABLET | Freq: Every day | ORAL | Status: DC
  Administered 2012-03-20 – 2012-03-22 (×3): 325 mg via ORAL
  Filled 2012-03-20 (×3): qty 1

## 2012-03-20 NOTE — ED Notes (Signed)
Dr. Caporossi at the bedside. 

## 2012-03-20 NOTE — ED Notes (Signed)
Pt placed on monitor and 12 lead complete and delivered to dr,

## 2012-03-20 NOTE — ED Provider Notes (Addendum)
History     CSN: [id number]  Arrival date & time 03/20/12  1225   First MD Initiated Contact with Patient 03/20/12 1249      Chief Complaint  Patient presents with  . Near Syncope    (Consider location/radiation/quality/duration/timing/severity/associated sxs/prior treatment) The history is provided by the patient and a relative.   the patient presented to the emergency department complaining of an episode of dizziness, which made her lie on the ground.  She states that she was outside sweeping her porch and became dizzy.  She had to lay on the ground  .  For over an hour.  She denied pain.  She denied palpitations of her heart.  She states that she has an occipital headache.  Now.  She denies nausea, vomiting.  She is blind in her right eye, but denies vision changes in her left eye.  She denies weakness.  She denies chest pain, cough, shortness of breath, or abdominal pain.  She states yesterday.  She was normal.  Past Medical History  Diagnosis Date  . Complete heart block 05/08/2011  . CAD (coronary artery disease) 05/08/2011  . Hypertensive heart disease without congestive heart failure 05/08/2011  . Chronic kidney disease (CKD), stage III (moderate) 05/08/2011  . Hyponatremia 05/08/2011  . Hyperlipemia 05/08/2011  . Type 2 diabetes 05/08/2011  . Murmur, cardiac 05/08/2011  . Osteoarthritis   . Anxiety     Past Surgical History  Procedure Date  . Tonsillectomy   . Left total knee replacement   . Abdominal hysterectomy   . Cataract extraction, bilateral   . Right shoulder hemiarthroplasty 2005    [name]    Family History  Problem Relation Age of Onset  . Stroke Mother     History  Substance Use Topics  . Smoking status: Never Smoker   . Smokeless tobacco: Never Used  . Alcohol Use: No    OB History    Grav Para Term Preterm Abortions TAB SAB Ect Mult Living                  Review of Systems  Constitutional: Negative for fever, chills and diaphoresis.   HENT: Negative for ear pain and neck pain.   Respiratory: Negative for cough and shortness of breath.   Cardiovascular: Negative for chest pain.  Gastrointestinal: Negative for nausea, vomiting, abdominal pain and diarrhea.  Genitourinary: Negative for dysuria.  Musculoskeletal: Negative for back pain.  Neurological: Positive for dizziness and headaches.  Psychiatric/Behavioral: Negative for confusion.  All other systems reviewed and are negative.    Allergies  Ciprofloxacin; Crestor; Escitalopram oxalate; Lipitor; Metronidazole; and Sulfa antibiotics  Home Medications   Current Outpatient Rx  Name Route Sig Dispense Refill  . AMLODIPINE BESYLATE 5 MG PO TABS Oral Take 5 mg by mouth daily.      . ASPIRIN 81 MG PO TABS Oral Take 81 mg by mouth daily.      Marland Kitchen BISACODYL 5 MG PO TBEC Oral Take 10 mg by mouth daily as needed. For constipation    . CARVEDILOL PHOSPHATE ER 40 MG PO CP24 Oral Take 40 mg by mouth daily.      . FUROSEMIDE 20 MG PO TABS Oral Take 10-20 mg by mouth 2 (two) times daily as needed. For swelling    . LISINOPRIL 40 MG PO TABS Oral Take 40 mg by mouth daily.      Marland Kitchen METFORMIN HCL 500 MG PO TABS Oral Take 500 mg by mouth 2 (two)  times daily with a meal.     . OFLOXACIN 0.3 % OP SOLN Both Eyes Place 1 drop into both eyes daily.     Marland Kitchen SIMVASTATIN 20 MG PO TABS Oral Take 20 mg by mouth at bedtime.        BP 120/49  Pulse 65  Resp 18  SpO2 92%  Physical Exam  Nursing note and vitals reviewed. Constitutional: She is oriented to person, place, and time.       Frail elderly, female, lying on the gurney, appearing week  HENT:  Head: Normocephalic and atraumatic.  Right Ear: External ear normal.  Left Ear: External ear normal.  Eyes: EOM are normal.       Opaque right cornea  Neck: Normal range of motion. Neck supple.  Cardiovascular: Normal rate, regular rhythm and intact distal pulses.   Murmur heard. Pulmonary/Chest: Effort normal and breath sounds normal. No  respiratory distress. She has no rales.  Abdominal: Soft. She exhibits no distension. There is no tenderness.  Musculoskeletal: Normal range of motion. She exhibits no edema.  Neurological: She is alert and oriented to person, place, and time. No cranial nerve deficit.       Pt is unable to sit up on her own.   She becomes very dizzy and falls backward onto the bed.  Skin: Skin is warm and dry.  Psychiatric: She has a normal mood and affect. Thought content normal.    ED Course  Procedures (including critical care time) 36 y female with dizziness and truncal ataxia.  Will do labs, ecg, ct head for evaluation.  Labs Reviewed  CBC WITH DIFFERENTIAL - Abnormal; Notable for the following:    WBC 19.2 (*)     Neutrophils Relative 90 (*)     Neutro Abs 17.2 (*)     Lymphocytes Relative 7 (*)     Monocytes Relative 2 (*)     All other components within normal limits  BASIC METABOLIC PANEL - Abnormal; Notable for the following:    Glucose, Bld 239 (*)     GFR calc non Af Amer 53 (*)     GFR calc Af Amer 61 (*)     All other components within normal limits  URINALYSIS, ROUTINE W REFLEX MICROSCOPIC - Abnormal; Notable for the following:    Glucose, UA 100 (*)     Protein, ur 30 (*)     All other components within normal limits  URINE MICROSCOPIC-ADD ON   Ct Head Wo Contrast  03/20/2012  *RADIOLOGY REPORT*  Clinical Data: Dizzy.  Fell while gardening.  CT HEAD WITHOUT CONTRAST  Technique:  Contiguous axial images were obtained from the base of the skull through the vertex without contrast.  Comparison: None.  Findings: There is no evidence for acute infarction, intracranial hemorrhage, mass lesion, hydrocephalus, or extra-axial fluid. There is mild atrophy with chronic microvascular ischemic change. There is remote right caudate lacuna.  Calvarium is intact.  There is advanced vascular calcification.  No acute sinus or mastoid fluid.  IMPRESSION: Chronic changes as described.  No skull  fracture or intracranial hemorrhage.  No visible acute stroke or bleed.   Original Report Authenticated By: Elsie Stain, M.D.      No diagnosis found.  ECG Ventricular paced rhythm at 63 bpm Left axis Wide complex due to paced rhythm Nl t waves  MDM  Leukocytosis Ataxia         Cheri Guppy, MD 03/20/12 1526  Cheri Guppy, MD 03/20/12  1527 

## 2012-03-20 NOTE — ED Notes (Signed)
Pt has pacemaker- Dr Ladona Ridgel with [facility].

## 2012-03-20 NOTE — ED Notes (Signed)
EMS 0.9NS still infusing.

## 2012-03-20 NOTE — ED Notes (Signed)
Pt arrives via EMS.  Medic stated pt was "dizzy" and fell while gardening. EMS stated pt conscious upon arrival.  Pt has abrasion to right elbow.  Pt cao to time, person. Pt +N/V.  NAD. No s/sx stroke.  Son at bedside.

## 2012-03-20 NOTE — H&P (Signed)
Outside the house, sweeping th porch. Larey Seat and lay about 1hrs. did not LOC. Dizzy all the time. No asymmetric weak. No CP, no SOB. New cough for 2 days.  Had to crawl into house and call 911. 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 She said she went outside to sweep her porch when she fell to the floor and landedon her right side. She relates she was there for about an hour, every time she tried to get up she felt dizzy and fell. She relates she doesn't think she lost consciousness. She is dizzy all the time and worsen with trying to get up. She relates no asymmetrical weakness. She relates no chest pain or shortness of breath. She does relate she has had a new cough for the past 2 days, but no fevers. She relates she has to crawl in the house as she couldn't get up to call 911.  Review of Systems:  Constitutional:  No weight loss, night sweats, Fevers, chills, fatigue.  HEENT:  No headaches, Difficulty swallowing,Tooth/dental problems,Sore throat,  No sneezing, itching, ear ache, nasal congestion, post nasal drip,  Cardio-vascular:  No chest pain, Orthopnea, PND, swelling in lower extremities, anasarca, dizziness, palpitations  GI:  No heartburn, indigestion, abdominal pain, nausea, vomiting, diarrhea, change in bowel habits, loss of appetite  Resp:  No shortness of breath with exertion or at rest. No excess mucus, no productive cough, No non-productive cough, No coughing up of blood.No change in color of mucus.No wheezing.No chest wall deformity  Skin:  no rash or lesions.  GU:  no dysuria, change in color of urine, no urgency or  frequency. No flank pain.  Musculoskeletal:  No joint pain or swelling. No decreased range of motion. No back pain.  Psych:  No change in mood or affect. No depression or anxiety. No memory loss.    Past Medical History  Diagnosis Date  . Complete heart block 05/08/2011  . CAD (coronary artery disease) 05/08/2011  . Hypertensive heart disease without congestive heart failure 05/08/2011  . Chronic kidney disease (CKD), stage III (moderate) 05/08/2011  . Hyponatremia 05/08/2011  . Hyperlipemia 05/08/2011  . Type 2 diabetes 05/08/2011  . Murmur, cardiac 05/08/2011  . Osteoarthritis   . Anxiety    Past Surgical History  Procedure Date  . Tonsillectomy   . Left total knee replacement   . Abdominal hysterectomy   . Cataract extraction, bilateral   . Right shoulder hemiarthroplasty 2005    [name]   Social History:  reports that she has never smoked. She has never used smokeless tobacco. She reports that she does not drink alcohol or use illicit drugs.  Allergies  Allergen Reactions  . Ciprofloxacin Nausea And Vomiting  . Crestor (Rosuvastatin Calcium) Swelling  . Escitalopram Oxalate Other (See Comments)    Yawning and insomnia   . Lipitor (Atorvastatin Calcium) Other (See Comments)    myalgia  . Metronidazole Nausea And Vomiting  . Sulfa Antibiotics Swelling    Family History  Problem Relation Age of Onset  . Stroke Mother     Prior to Admission medications   Medication Sig  Start Date End Date Taking? Authorizing Provider  amLODipine (NORVASC) 5 MG tablet Take 5 mg by mouth daily.     Yes Historical Provider, MD  aspirin 81 MG tablet Take 81 mg by mouth daily.     Yes Historical Provider, MD  bisacodyl (DULCOLAX) 5 MG EC tablet Take 10 mg by mouth daily as needed. For constipation   Yes Historical Provider, MD  carvedilol (COREG CR) 40 MG 24 hr capsule Take 40 mg by mouth daily.     Yes Historical Provider, MD  furosemide (LASIX) 20 MG tablet Take 10-20 mg by mouth 2  (two) times daily as needed. For swelling   Yes Historical Provider, MD  lisinopril (PRINIVIL,ZESTRIL) 40 MG tablet Take 40 mg by mouth daily.     Yes Historical Provider, MD  metFORMIN (GLUCOPHAGE) 500 MG tablet Take 500 mg by mouth 2 (two) times daily with a meal.    Yes Historical Provider, MD  ofloxacin (OCUFLOX) 0.3 % ophthalmic solution Place 1 drop into both eyes daily.    Yes Historical Provider, MD  simvastatin (ZOCOR) 20 MG tablet Take 20 mg by mouth at bedtime.     Yes Historical Provider, MD   Physical Exam: Filed Vitals:   03/20/12 1245 03/20/12 1300 03/20/12 1315  BP: 137/57 127/60 120/49  Pulse: 64 66 65  Resp: 21 21 18   SpO2: 95% 92% 92%   BP 120/49  Pulse 65  Resp 18  SpO2 92%  General Appearance:    Alert, cooperative, no distress, appears stated age  Head:    Normocephalic, without obvious abnormality, atraumatic           Throat:   lips mucosa and tongue appears to be dry   Neck:   Supple, symmetrical, trachea midline, no adenopathy;    thyroid:  no enlargement/tenderness/nodules; no carotid   bruit or JVD  Back:     Symmetric, no curvature, ROM normal, no CVA tenderness  Lungs:     Clear to auscultation bilaterally, respirations unlabored  Chest Wall:    No tenderness or deformity   Heart:    Regular rate and rhythm, S1 and S2 normal, no murmur, rub   or gallop     Abdomen:     Soft, non-tender, bowel sounds active all four quadrants,    no masses, no organomegaly        Extremities:   Extremities normal, she has couple scratches on her right temple, no cyanosis or edema  Pulses:   2+ and symmetric all extremities  Skin:   Skin color, texture, turgor normal, no rashes or lesions  Lymph nodes:   Cervical, supraclavicular, and axillary nodes normal  Neurologic:   CNII-XII intact, normal strength, sensation and reflexes    throughout    Labs on Admission:  Basic Metabolic Panel:  Lab 03/20/12 1610  NA 136  K 4.1  CL 101  CO2 24  GLUCOSE 239*    BUN 23  CREATININE 0.92  CALCIUM 9.2  MG --  PHOS --   Liver Function Tests: No results found for this basename: AST:5,ALT:5,ALKPHOS:5,BILITOT:5,PROT:5,ALBUMIN:5 in the last 168 hours No results found for this basename: LIPASE:5,AMYLASE:5 in the last 168 hours No results found for this basename: AMMONIA:5 in the last 168 hours CBC:  Lab 03/20/12 1252  WBC 19.2*  NEUTROABS 17.2*  HGB 14.1  HCT 41.8  MCV 91.5  PLT 362   Cardiac Enzymes: No results found for this basename: CKTOTAL:5,CKMB:5,CKMBINDEX:5,TROPONINI:5 in the last 168 hours  BNP: No components found with this basename: POCBNP:5 CBG: No results found for this basename: GLUCAP:5 in the last 168 hours  Radiological Exams on Admission: Ct Head Wo Contrast  03/20/2012  *RADIOLOGY REPORT*  Clinical Data: Dizzy.  Fell while gardening.  CT HEAD WITHOUT CONTRAST  Technique:  Contiguous axial images were obtained from the base of the skull through the vertex without contrast.  Comparison: None.  Findings: There is no evidence for acute infarction, intracranial hemorrhage, mass lesion, hydrocephalus, or extra-axial fluid. There is mild atrophy with chronic microvascular ischemic change. There is remote right caudate lacuna.  Calvarium is intact.  There is advanced vascular calcification.  No acute sinus or mastoid fluid.  IMPRESSION: Chronic changes as described.  No skull fracture or intracranial hemorrhage.  No visible acute stroke or bleed.   Original Report Authenticated By: Elsie Stain, M.D.    Dg Chest Port 1 View  03/20/2012  *RADIOLOGY REPORT*  Clinical Data: Dizziness, weakness, possible pneumonia  PORTABLE CHEST - 1 VIEW  Comparison: 05/10/2011  Findings: Low lung volumes with vascular crowding.  Increased interstitial markings, likely chronic.  Mild patchy bilateral lower lobe opacities, favored to reflect atelectasis. Medial right lower lobe pneumonia is not entirely excluded.  No pleural effusion or pneumothorax.  Mild  cardiomegaly.  Left subclavian pacemaker.  Right shoulder arthroplasty.  IMPRESSION: Mild patchy bilateral lower lobe opacities, favored to reflect atelectasis.  Medial right lower lobe pneumonia is not entirely excluded but is considered less likely.  Mild cardiomegaly.  These results were called by telephone on 03/20/2012 at 1555 hrs to Dr Pricilla Loveless, who verbally acknowledged these results.   Original Report Authenticated By: Charline Bills, M.D.     EKG: Independently reviewed. Paced rhythm with a wide QRS, with no T wave inversions.  Assessment/Plan: Principal Problem: Near syncope, Unclear etiology of her syncope, as the patient is a poor historian, there is nobody at bedside. But at this time we'll admit her to the hospital start her on IV fluids check her cardiac enzymes, check a TSH, I cannot do an MRI to rule out a posterior fossa stroke as she has a pacemaker. CT scan of the head was done that showed no acute bleeds. We'll get physical therapy to evaluate her. We'll give her meclizine for dizziness. We'll place her n.p.o. and will get a swallowing evaluation, when she has passed her swallowing evaluation we'll give her a regular diet, we'll get a fasting lipid panel, start her on aspirin check a fasting liver panel and continue statins., We'll also get a 2-D echo to rule out aortic stenosis, as she has a systolic ejection murmur in the aortic area. We'll admit her to telemetry start on a PPI, frequent neuro checks and strict ins and outs.  Hyperlipemia -We'll check a fasting lipid panel continue statins.  Type 2 diabetes -will be n.p.o. and if she passes her swallowing evaluation will change to carb modified diet. Also start her on sliding scale insulin and check a hemoglobin A1c. She is able to take 2 by mouth was started on long-acting insulin. I will her Lantus in case she needs contrast in any further studies.  Chronic kidney disease (CKD), stage III (moderate) -Her creatinine  seems to be at baseline. She does appear to be dehydrated by physical exam as her mucous membranes are significantly dry. -start her on IV fluids and check a basic metabolic panel in the morning.  Leukocytosis -Unclear etiology this could be secondary to stress emargination.  I will go ahead and hydrate her and repeat chest x-ray in the morning, as she was complaining of some cough for 2 days prior to admission. If the chest x-ray shows a new infiltrate we'll start her at that time on empiric antibiotics. A UA does not show any signs of infection. She is not in DKA as she doesn't have a gap. This could also be secondary to a stroke, but she is nonfocal by physical exam. She is dizzy upon standing, but her finger to nose is normal. I did not test her ambulation as she has significant dizziness. I will hydrate her and give her meclizine to see if her dizziness improved and reevaluate her physical exam in the morning.  Time spend: Greater than 60 with Code Status: Full Family Communication: Patient Disposition Plan: To be determined after physical therapy evaluation  Marinda Elk, MD  [facility] Pager [phone]  If 7PM-7AM, please contact night-coverage [email or web address] Password [facility] 03/20/2012, 4:36 PM

## 2012-03-20 NOTE — ED Notes (Signed)
Patient transported to CT 

## 2012-03-21 ENCOUNTER — Inpatient Hospital Stay (HOSPITAL_COMMUNITY): Payer: Medicare Other

## 2012-03-21 ENCOUNTER — Encounter (HOSPITAL_COMMUNITY): Payer: Self-pay | Admitting: *Deleted

## 2012-03-21 DIAGNOSIS — R55 Syncope and collapse: Secondary | ICD-10-CM

## 2012-03-21 DIAGNOSIS — I119 Hypertensive heart disease without heart failure: Secondary | ICD-10-CM

## 2012-03-21 DIAGNOSIS — I639 Cerebral infarction, unspecified: Secondary | ICD-10-CM | POA: Insufficient documentation

## 2012-03-21 LAB — COMPREHENSIVE METABOLIC PANEL
AST: 22 U/L (ref 0–37)
Albumin: 3.4 g/dL — ABNORMAL LOW (ref 3.5–5.2)
Alkaline Phosphatase: 74 U/L (ref 39–117)
Chloride: 100 mEq/L (ref 96–112)
Creatinine, Ser: 0.77 mg/dL (ref 0.50–1.10)
Potassium: 4 mEq/L (ref 3.5–5.1)
Total Bilirubin: 0.4 mg/dL (ref 0.3–1.2)

## 2012-03-21 LAB — CARDIAC PANEL(CRET KIN+CKTOT+MB+TROPI)
CK, MB: 2 ng/mL (ref 0.3–4.0)
Relative Index: INVALID (ref 0.0–2.5)
Relative Index: INVALID (ref 0.0–2.5)
Total CK: 40 U/L (ref 7–177)
Troponin I: <0.3 ng/mL (ref <0.30)

## 2012-03-21 LAB — LIPID PANEL
Cholesterol: 127 mg/dL (ref 0–200)
HDL: 57 mg/dL (ref >39)
LDL Cholesterol: 58 mg/dL (ref 0–99)
Triglycerides: 60 mg/dL (ref <150)

## 2012-03-21 LAB — CBC
MCH: 31.2 pg (ref 26.0–34.0)
MCV: 90.7 fL (ref 78.0–100.0)
Platelets: 382 10*3/uL (ref 150–400)
RBC: 4.43 MIL/uL (ref 3.87–5.11)

## 2012-03-21 LAB — HEMOGLOBIN A1C: Hgb A1c MFr Bld: 6.5 % — ABNORMAL HIGH (ref <5.7)

## 2012-03-21 LAB — GLUCOSE, CAPILLARY
Glucose-Capillary: 126 mg/dL — ABNORMAL HIGH (ref 70–99)
Glucose-Capillary: 144 mg/dL — ABNORMAL HIGH (ref 70–99)
Glucose-Capillary: 298 mg/dL — ABNORMAL HIGH (ref 70–99)
Glucose-Capillary: 144 mg/dL — ABNORMAL HIGH (ref 70–99)

## 2012-03-21 MED ORDER — MECLIZINE HCL 25 MG PO TABS
25.0000 mg | ORAL_TABLET | Freq: Two times a day (BID) | ORAL | Status: DC
  Administered 2012-03-21 – 2012-03-23 (×5): 25 mg via ORAL
  Filled 2012-03-21 (×6): qty 1

## 2012-03-21 NOTE — Progress Notes (Signed)
  Echocardiogram 2D Echocardiogram has been performed.  Lynn Weeks 03/21/2012, 3:27 PM

## 2012-03-21 NOTE — Progress Notes (Signed)
[facility] PROGRESS NOTE  Lynn Weeks RUE:[id number] DOB: May 02, 1919 DOA: 03/20/2012   Assessment/Plan: Patient Active Hospital Problem List: Near syncope (03/20/2012) -she continues to have dizziness. I will started her on meclizine. Her cardiac enzymes have been negative. She has been volume resuscitated. I will get physical therapy to give her inner ear exercises as the most likely cause of her near syncope is probably vertigo. Her family which is at bedside today relates that she has had inner ear problems in the past. She had a swallowing evaluation which he passed. She's tolerating her diet well. Her carotid Doppler was negative for any as yet stenosis. She has had no events on telemetry so we'll go ahead and DC the telemetry.   Hyperlipemia (05/08/2011) -continue statins   Type 2 diabetes (05/08/2011) -blood glucose control today. Continue current treatment.  Acute on Chronic kidney disease (CKD), stage III (moderate) (05/08/2011) -now resolved, this is most likely secondary to decreased intravascular volume .  Leukocytosis (03/20/2012) -continue to improve without any antibiotics. She continues to complain of a mild cough. We'll repeat a chest x-ray to see if the pneumonia clears up after hydration.   Code Status: Full  Family Communication: Patient  Disposition Plan: To be determined after physical therapy evaluation   LOS: 1 day   Procedures:  Chest x-ray   Subjective: Patient relates her dizziness is much better. She relates she doesn't feel it is much. She does not relate worsening with movement.  Objective: Filed Vitals:   03/20/12 2017 03/20/12 2145 03/21/12 0120 03/21/12 0650  BP: 143/54  142/65 155/64  Pulse: 82  89 83  Temp: 97.5 F (36.4 C) 97.8 F (36.6 C) 98.5 F (36.9 C) 97.8 F (36.6 C)  TempSrc: Oral  Oral Axillary  Resp: 18  16 18   Height:      Weight:    46.267 kg (102 lb)  SpO2: 94%  96% 94%    Intake/Output Summary (Last 24  hours) at 03/21/12 1122 Last data filed at 03/21/12 0900  Gross per 24 hour  Intake    170 ml  Output    900 ml  Net   -730 ml   Weight change:   Exam:  General: Alert, awake, oriented x3, in no acute distress.  Heart: Regular rate and rhythm, without murmurs, rubs, gallops.  Lungs: Good air movement, bilateral air movement.  Abdomen: Soft, nontender, nondistended, positive bowel sounds.  Neuro: Grossly intact, nonfocal.   Data Reviewed: Basic Metabolic Panel:  Lab 03/21/12 1478 03/20/12 1811 03/20/12 1252  NA 135 -- 136  K 4.0 -- 4.1  CL 100 -- 101  CO2 22 -- 24  GLUCOSE 153* -- 239*  BUN 18 -- 23  CREATININE 0.77 0.80 0.92  CALCIUM 8.7 -- 9.2  MG -- -- --  PHOS -- -- --   Liver Function Tests:  Lab 03/21/12 0126  AST 22  ALT 10  ALKPHOS 74  BILITOT 0.4  PROT 6.6  ALBUMIN 3.4*   No results found for this basename: LIPASE:5,AMYLASE:5 in the last 168 hours No results found for this basename: AMMONIA:5 in the last 168 hours CBC:  Lab 03/21/12 0126 03/20/12 1811 03/20/12 1252  WBC 15.5* 17.4* 19.2*  NEUTROABS -- -- 17.2*  HGB 13.8 14.8 14.1  HCT 40.2 44.0 41.8  MCV 90.7 90.9 91.5  PLT 382 381 362   Cardiac Enzymes:  Lab 03/21/12 0127 03/20/12 1820  CKTOTAL 40 44  CKMB 2.0 2.3  CKMBINDEX -- --  TROPONINI <0.30 <0.30   BNP: No components found with this basename: POCBNP:5 CBG:  Lab 03/21/12 0647 03/20/12 2212 03/20/12 1811  GLUCAP 126* 169* 224*    No results found for this or any previous visit (from the past 240 hour(s)).   Studies: Ct Head Wo Contrast  03/20/2012  *RADIOLOGY REPORT*  Clinical Data: Dizzy.  Fell while gardening.  CT HEAD WITHOUT CONTRAST  Technique:  Contiguous axial images were obtained from the base of the skull through the vertex without contrast.  Comparison: None.  Findings: There is no evidence for acute infarction, intracranial hemorrhage, mass lesion, hydrocephalus, or extra-axial fluid. There is mild atrophy with  chronic microvascular ischemic change. There is remote right caudate lacuna.  Calvarium is intact.  There is advanced vascular calcification.  No acute sinus or mastoid fluid.  IMPRESSION: Chronic changes as described.  No skull fracture or intracranial hemorrhage.  No visible acute stroke or bleed.   Original Report Authenticated By: Elsie Stain, M.D.    Dg Chest Port 1 View  03/20/2012  *RADIOLOGY REPORT*  Clinical Data: Dizziness, weakness, possible pneumonia  PORTABLE CHEST - 1 VIEW  Comparison: 05/10/2011  Findings: Low lung volumes with vascular crowding.  Increased interstitial markings, likely chronic.  Mild patchy bilateral lower lobe opacities, favored to reflect atelectasis. Medial right lower lobe pneumonia is not entirely excluded.  No pleural effusion or pneumothorax.  Mild cardiomegaly.  Left subclavian pacemaker.  Right shoulder arthroplasty.  IMPRESSION: Mild patchy bilateral lower lobe opacities, favored to reflect atelectasis.  Medial right lower lobe pneumonia is not entirely excluded but is considered less likely.  Mild cardiomegaly.  These results were called by telephone on 03/20/2012 at 1555 hrs to Dr Pricilla Loveless, who verbally acknowledged these results.   Original Report Authenticated By: Charline Bills, M.D.     Scheduled Meds:   . aspirin  300 mg Rectal Daily   Or  . aspirin  325 mg Oral Daily  . carvedilol  40 mg Oral Daily  . heparin  5,000 Units Subcutaneous Q8H  . insulin aspart  0-5 Units Subcutaneous QHS  . insulin aspart  0-9 Units Subcutaneous TID WC  . insulin glargine  5 Units Subcutaneous QHS  . meclizine  25 mg Oral BID  . ofloxacin  1 drop Both Eyes Daily  . simvastatin  20 mg Oral QHS  . DISCONTD: sodium chloride   Intravenous STAT  . DISCONTD: aspirin  81 mg Oral Daily  . DISCONTD: heparin  5,000 Units Subcutaneous Q8H   Continuous Infusions:   . sodium chloride 125 mL/hr at 03/20/12 1259  . DISCONTD: sodium chloride      Lambert Keto,  MD  [facility] Pager [phone]  If 7PM-7AM, please contact night-coverage [email or web address] Password [facility] 03/21/2012, 11:22 AM

## 2012-03-21 NOTE — Progress Notes (Signed)
Pt passed swallow evaluation will put pt on med carb modified diet per MD order, Thanks, Lavonda Jumbo RN

## 2012-03-21 NOTE — Progress Notes (Signed)
Physical Therapy Evaluation Patient Details Name: Lynn Weeks MRN: [id number] DOB: Dec 25, 1918 Today's Date: 03/21/2012 Time: 4098-1191 PT Time Calculation (min): 25 min  PT Assessment / Plan / Recommendation Clinical Impression  79 yo female admitted with near syncopal event, CT negative for CVA, presetns with symptoms consistent with vestibular dysfunction; Will benefit form PT to maximize independence and safety with mobility, amb, and for education in vestibular therex, and to facilitate dc planning, to enable safe dc home with prn family assist    PT Assessment  Patient needs continued PT services    Follow Up Recommendations  Home health PT;Supervision/Assistance - 24 hour    Barriers to Discharge        Equipment Recommendations  Rolling walker with 5" wheels;3 in 1 bedside comode    Recommendations for Other Services OT consult   Frequency Min 3X/week    Precautions / Restrictions Precautions Precautions: Fall   Pertinent Vitals/Pain No specific reports of pain      Mobility  Bed Mobility Bed Mobility: Rolling Right;Rolling Left;Right Sidelying to Sit Rolling Right: 5: Supervision;With rail Rolling Left: 5: Supervision;With rail Right Sidelying to Sit: 3: Mod assist;With rails Details for Bed Mobility Assistance: Noted nystagmus with rolling to Left, which lasted greater than 30 seconds; Physical assist to elevate trunk from bed; Pt with loss of balance to right initially upon sitting up, requiring physical assist to prevent leaning all the way back to sidelying in bed Transfers Transfers: Sit to Stand;Stand to Sit Sit to Stand: 3: Mod assist;With upper extremity assist;From bed Stand to Sit: 3: Mod assist;With armrests;To chair/3-in-1 Details for Transfer Assistance: Cues for safety, control, and hand palcement; Had pt focus on her Granddaughter's face during transfer in hopes of minimizing dizziness Ambulation/Gait Ambulation/Gait Assistance: 3: Mod  assist Ambulation Distance (Feet): 3 Feet (pivot steps bed to chair) Assistive device: 1 person hand held assist Ambulation/Gait Assistance Details: Short steps, pt quite fearful of falling; facilitation of weight shift into stance right and left to free up swing LE for advancement    Exercises     PT Diagnosis: Difficulty walking  PT Problem List: Decreased strength;Decreased activity tolerance;Decreased balance;Decreased mobility;Decreased coordination;Decreased knowledge of use of DME;Decreased knowledge of precautions;Other (comment) (Vestibular dysfunction) PT Treatment Interventions: DME instruction;Gait training;Stair training;Functional mobility training;Therapeutic activities;Therapeutic exercise;Balance training;Neuromuscular re-education;Patient/family education;Manual techniques   PT Goals Acute Rehab PT Goals PT Goal Formulation: With patient/family Time For Goal Achievement: 04/04/12 Potential to Achieve Goals: Good Pt will go Supine/Side to Sit: with modified independence PT Goal: Supine/Side to Sit - Progress: Goal set today Pt will go Sit to Supine/Side: with modified independence PT Goal: Sit to Supine/Side - Progress: Goal set today Pt will go Sit to Stand: with supervision PT Goal: Sit to Stand - Progress: Goal set today Pt will go Stand to Sit: with supervision PT Goal: Stand to Sit - Progress: Goal set today Pt will Transfer Bed to Chair/Chair to Bed: with supervision PT Transfer Goal: Bed to Chair/Chair to Bed - Progress: Goal set today Pt will Stand: with supervision;6 - 10 min;with unilateral upper extremity support PT Goal: Stand - Progress: Goal set today Pt will Ambulate: 51 - 150 feet;with supervision;with least restrictive assistive device PT Goal: Ambulate - Progress: Goal set today Pt will Go Up / Down Stairs: 3-5 stairs;with supervision;with rail(s) PT Goal: Up/Down Stairs - Progress: Goal set today Pt will Perform Home Exercise Program: with  supervision, verbal cues required/provided PT Goal: Perform Home Exercise Program - Progress: Goal  set today  Visit Information  Last PT Received On: 03/21/12 Assistance Needed: +1 (consider +2 to push chair if needed)    Subjective Data  Subjective: Agreeable to OOB Patient Stated Goal: feel better and go home   Prior Functioning  Home Living Lives With: Alone Available Help at Discharge: Family;Available 24 hours/day (Granddaughter Porfirio Mylar says family is arranging 24 h assist) Type of Home: House Home Access: Stairs to enter Secretary/administrator of Steps: 4 Entrance Stairs-Rails: Right Home Layout: One level Bathroom Shower/Tub:  (to be determined) Home Adaptive Equipment: Walker - standard;Straight cane Prior Function Level of Independence: Independent with assistive device(s) Able to Take Stairs?: Yes Communication Communication: HOH    Cognition  Overall Cognitive Status: Appears within functional limits for tasks assessed/performed Arousal/Alertness: Awake/alert Orientation Level: Appears intact for tasks assessed Behavior During Session: [facility] Hights for tasks performed    Extremity/Trunk Assessment Right Upper Extremity Assessment RUE ROM/Strength/Tone: [facility] for tasks assessed Left Upper Extremity Assessment LUE ROM/Strength/Tone: WFL for tasks assessed Right Lower Extremity Assessment RLE ROM/Strength/Tone: Deficits RLE ROM/Strength/Tone Deficits: Generalized weakness, requiring phsyical assistance for sit to stand Left Lower Extremity Assessment LLE ROM/Strength/Tone: Deficits LLE ROM/Strength/Tone Deficits: Generalized weakness, requiring phsyical assistance for sit to stand Trunk Assessment Trunk Assessment:  (tending to Right lean)   Balance Balance Balance Assessed: Yes Static Sitting Balance Static Sitting - Balance Support: Right upper extremity supported;Left upper extremity supported Static Sitting - Level of Assistance: 2: Max assist;4: Min assist (up to  max assist) Static Sitting - Comment/# of Minutes: EOB 3-5 minutes with needing up to max assist for balance as she had a significant right lean; No overt nystagmus noted  End of Session PT - End of Session Equipment Utilized During Treatment: Gait belt Activity Tolerance: Patient tolerated treatment well Patient left: in chair;with call bell/phone within reach;with family/visitor present Nurse Communication: Mobility status  GP     Olen Pel [place], [place] [phone]  03/21/2012, 4:27 PM

## 2012-03-21 NOTE — Progress Notes (Addendum)
VASCULAR LAB PRELIMINARY  PRELIMINARY  PRELIMINARY  PRELIMINARY  Carotid duplex  completed.    Preliminary report:  Right:  40-59% (low end of range) internal carotid artery stenosis.  Left:  No evidence of hemodynamically significant internal carotid artery stenosis.     Vertebral artery flow is antegrade.     Jennafer Gladue, RVT 03/21/2012, 9:26 AM

## 2012-03-21 NOTE — Evaluation (Signed)
Speech Language Pathology Evaluation Patient Details Name: Lynn Weeks MRN: [id number] DOB: 1918/11/17 Today's Date: 03/21/2012 Time: 7829-5621 SLP Time Calculation (min): 15 min  Problem List:  Patient Active Problem List  Diagnosis  . CAD (coronary artery disease)  . Complete heart block  . Hypertensive heart disease without congestive heart failure  . Hyperlipemia  . Type 2 diabetes  . Chronic kidney disease (CKD), stage III (moderate)  . Hyponatremia  . Murmur, cardiac  . Near syncope  . Leukocytosis  . Vertigo   Past Medical History:  Past Medical History  Diagnosis Date  . Complete heart block 05/08/2011  . CAD (coronary artery disease) 05/08/2011  . Hypertensive heart disease without congestive heart failure 05/08/2011  . Chronic kidney disease (CKD), stage III (moderate) 05/08/2011  . Hyponatremia 05/08/2011  . Hyperlipemia 05/08/2011  . Type 2 diabetes 05/08/2011  . Murmur, cardiac 05/08/2011  . Osteoarthritis   . Anxiety    Past Surgical History:  Past Surgical History  Procedure Date  . Tonsillectomy   . Left total knee replacement   . Abdominal hysterectomy   . Cataract extraction, bilateral   . Right shoulder hemiarthroplasty 2005    [name]  . 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  Patient admitted to ED with dizziness.  CT scan reveals  no skull fracture or intracranial hemorrhage. No visible acute stroke or bleed.  Patient referred for Cognitive Linguistic Evaluation per Stroke Protocol.      Assessment / Plan / Recommendation Clinical Impression  Cognitive Linguistic skills baseline with no deficits noted.  No further Speech Language Pathology warranted. ST to sign off as education completed.     SLP Assessment  Patient does not need any further Speech Lanaguage Pathology Services                  SLP Evaluation Prior Functioning  Cognitive/Linguistic Baseline: Within functional limits Type of Home: House Lives With: Alone Available Help at Discharge: Family;Available 24 hours/day Vocation: Retired   IT consultant  Overall Cognitive Status: Appears within functional limits for tasks assessed Arousal/Alertness: Awake/alert    Comprehension  Auditory Comprehension Overall Auditory Comprehension: Appears within functional limits for tasks assessed    Expression Expression Primary Mode of Expression: Verbal Verbal Expression Overall Verbal Expression: Appears within functional limits for tasks assessed   Oral / Motor Oral Motor/Sensory Function Overall Oral Motor/Sensory Function: Appears within functional limits for tasks assessed Motor Speech Overall Motor Speech: Appears within functional limits for tasks assessed    Moreen Fowler MS, CCC-SLP [phone]     [facility] 03/21/2012, 5:16 PM

## 2012-03-22 ENCOUNTER — Encounter (HOSPITAL_COMMUNITY): Payer: Self-pay | Admitting: Neurology

## 2012-03-22 DIAGNOSIS — E119 Type 2 diabetes mellitus without complications: Secondary | ICD-10-CM

## 2012-03-22 DIAGNOSIS — E7889 Other lipoprotein metabolism disorders: Secondary | ICD-10-CM

## 2012-03-22 DIAGNOSIS — R279 Unspecified lack of coordination: Secondary | ICD-10-CM

## 2012-03-22 DIAGNOSIS — R269 Unspecified abnormalities of gait and mobility: Secondary | ICD-10-CM

## 2012-03-22 DIAGNOSIS — E785 Hyperlipidemia, unspecified: Secondary | ICD-10-CM

## 2012-03-22 DIAGNOSIS — R42 Dizziness and giddiness: Secondary | ICD-10-CM

## 2012-03-22 DIAGNOSIS — I6330 Cerebral infarction due to thrombosis of unspecified cerebral artery: Secondary | ICD-10-CM

## 2012-03-22 LAB — GLUCOSE, CAPILLARY
Glucose-Capillary: 129 mg/dL — ABNORMAL HIGH (ref 70–99)
Glucose-Capillary: 142 mg/dL — ABNORMAL HIGH (ref 70–99)

## 2012-03-22 MED ORDER — LISINOPRIL 40 MG PO TABS
40.0000 mg | ORAL_TABLET | Freq: Every day | ORAL | Status: DC
  Administered 2012-03-22 – 2012-03-23 (×2): 40 mg via ORAL
  Filled 2012-03-22 (×2): qty 1

## 2012-03-22 MED ORDER — ASPIRIN 81 MG PO CHEW: 81.0000 mg | CHEWABLE_TABLET | Freq: Every day | ORAL | Status: DC

## 2012-03-22 MED ORDER — FUROSEMIDE 20 MG PO TABS
10.0000 mg | ORAL_TABLET | Freq: Two times a day (BID) | ORAL | Status: DC | PRN
  Filled 2012-03-22: qty 1

## 2012-03-22 MED ORDER — CLOPIDOGREL BISULFATE 75 MG PO TABS
75.0000 mg | ORAL_TABLET | Freq: Every day | ORAL | Status: DC
  Administered 2012-03-22 – 2012-03-23 (×2): 75 mg via ORAL
  Filled 2012-03-22 (×3): qty 1

## 2012-03-22 MED ORDER — METFORMIN HCL 500 MG PO TABS
500.0000 mg | ORAL_TABLET | Freq: Two times a day (BID) | ORAL | Status: DC
  Administered 2012-03-22 – 2012-03-23 (×3): 500 mg via ORAL
  Filled 2012-03-22 (×4): qty 1

## 2012-03-22 NOTE — Consult Note (Signed)
[facility] CONSULT NOTE     Reason for Consult: dizziness and falls to the right.     HPI:    Lynn Weeks is an 78 y.o. female with history of complete heart block S/P pacemaker, CKD, hyperlipidemia and diabetes.  Patient was outside on her porch Saturday when she felt sudden onset right to left vertigo.  She held onto the railing with her left hand and stooped tot he floor.   She denies loss of conciousness. She remained on the porch floor due to sever vertigo. Eventually she made her way into the house (30 minutes later) and called her son.  She did have N/V.  From that point on her vertigo had dissipated but could not stand due to listing to the right. Patient was on ASA 81 mg prior to hospitalization. Patient has had inner ear problems in the past -but this was associated with ear infection.   At present time she has no vertigo or dizziness but when stood up she feels she is falling to the right with both eyes open and eyes closed. Exam did not show any right sided weakness.   While hospitalized: Carotid doppler: showing right 40-59% ICA stenosis. No left ICA stenosis.  CXR -negative Cardiac markers-negative Initial labs-- Glucose 239,  WBC 19.2 -now 15.5,  UA (-),  CT head negative for bleed, mass or infarction. LDL-58, A1c 6.5 (average glucose 141), TSH-0.92    Past Medical History  Diagnosis Date  . Complete heart block 05/08/2011  . CAD (coronary artery disease) 05/08/2011  . Hypertensive heart disease without congestive heart failure 05/08/2011  . Hyponatremia 05/08/2011  . Hyperlipemia 05/08/2011  . Type 2 diabetes 05/08/2011  . Murmur, cardiac 05/08/2011  . Osteoarthritis   . Anxiety     Past Surgical History  Procedure Date  . Tonsillectomy   . Left total knee replacement   . Abdominal hysterectomy   . Cataract extraction, bilateral   . Right shoulder hemiarthroplasty 2005    [name]  . Pacemaker insertion     Family  History  Problem Relation Age of Onset  . Stroke Mother     Social History:  reports that she has never smoked. She has never used smokeless tobacco. She reports that she does not drink alcohol or use illicit drugs.  Allergies  Allergen Reactions  . Ciprofloxacin Nausea And Vomiting  . Crestor (Rosuvastatin Calcium) Swelling  . Escitalopram Oxalate Other (See Comments)    Yawning and insomnia   . Lipitor (Atorvastatin Calcium) Other (See Comments)    myalgia  . Metronidazole Nausea And Vomiting  . Sulfa Antibiotics Swelling    Medications:    Prior to Admission:  Prescriptions prior to admission  Medication Sig Dispense Refill  . amLODipine (NORVASC) 5 MG tablet Take 5 mg by mouth daily.        Marland Kitchen aspirin 81 MG tablet Take 81 mg by mouth daily.        . bisacodyl (DULCOLAX) 5 MG EC tablet Take 10 mg by mouth daily as needed. For constipation      . carvedilol (COREG CR) 40 MG 24 hr capsule Take 40 mg by mouth daily.        . furosemide (LASIX) 20 MG tablet Take 10-20 mg by mouth 2 (two) times daily as needed. For swelling      . lisinopril (PRINIVIL,ZESTRIL)  40 MG tablet Take 40 mg by mouth daily.        . metFORMIN (GLUCOPHAGE) 500 MG tablet Take 500 mg by mouth 2 (two) times daily with a meal.       . ofloxacin (OCUFLOX) 0.3 % ophthalmic solution Place 1 drop into both eyes daily.       . simvastatin (ZOCOR) 20 MG tablet Take 20 mg by mouth at bedtime.         Scheduled:    . carvedilol  40 mg Oral Daily  . clopidogrel  75 mg Oral Q breakfast  . heparin  5,000 Units Subcutaneous Q8H  . insulin aspart  0-5 Units Subcutaneous QHS  . insulin aspart  0-9 Units Subcutaneous TID WC  . insulin glargine  5 Units Subcutaneous QHS  . lisinopril  40 mg Oral Daily  . meclizine  25 mg Oral BID  . metFORMIN  500 mg Oral BID WC  . ofloxacin  1 drop Both Eyes Daily  . simvastatin  20 mg Oral QHS  . DISCONTD: aspirin  81 mg Oral Daily  . DISCONTD: aspirin  300 mg Rectal Daily  .  DISCONTD: aspirin  325 mg Oral Daily    Review of Systems - General ROS: negative for - chills, fatigue, fever or hot flashes Hematological and Lymphatic ROS: negative for - bruising, fatigue, jaundice or pallor Endocrine ROS: negative for - hair pattern changes, hot flashes, mood swings or skin changes Respiratory ROS: negative for - cough, hemoptysis, orthopnea or wheezing Cardiovascular ROS: negative for - dyspnea on exertion, orthopnea, palpitations or shortness of breath Gastrointestinal ROS: negative for - abdominal pain, appetite loss, blood in stools, diarrhea or hematemesis Musculoskeletal ROS: negative for - joint pain, joint stiffness, joint swelling or muscle pain Neurological ROS: positive for - dizziness, balance instability Dermatological ROS: negative for dry skin, pruritus and rash   Blood pressure 111/48, pulse 63, temperature 97.5 F (36.4 C), temperature source Oral, resp. rate 20, height 4\' 6"  (1.372 m), weight 45.813 kg (101 lb), SpO2 93.00%.   Neurologic Examination:   Mental Status: Alert, oriented X 3.  Speech fluent without evidence of aphasia. Able to follow 3 step commands without difficulty. Mood: upbeat Memory: intact Thought content appropriate Cranial Nerves: II-Visual fields grossly intact in left eye. Right eye is blind. III/IV/VI-Extraocular movements intact.  Pupils reactive bilaterally. Ptosis present in right eye. V/VII-Smile symmetric except she does show more teeth on the left.  VIII-decreased and states she has a sensation of left inner ear fullness.  IX/X-normal gag XI-bilateral shoulder shrug XII-midline tongue extension Motor: 5/5 bilaterally with normal tone and decreased bulk throughout.  No drift, tremor or asterixis.  Sensory: Pinprick and light touch intact throughout with exception of decreased vibratory sensation in the feet and ankles.  Proprioception intact.  Deep Tendon Reflexes:  Right: Upper Extremity   Left: Upper extremity     biceps (C-5 to C-6) 2/4   biceps (C-5 to C-6) 2/4 tricep (C7) 2/4    triceps (C7) 2/4 Brachioradialis (C6) 2/4  Brachioradialis (C6) 2/4  Lower Extremity Lower Extremity  quadriceps (L-2 to L-4) 2/4   quadriceps (L-2 to L-4) 2/4 Achilles (S1) 0/4   Achilles (S1) 0/4      Plantars:      Right:  downgoing     Left:  Downgoing Cerebellar: Normal finger-to-nose, normal rapid alternating movements and normal heel-to-shin test.   Gait: Normal gait and station.   Lab Results  Component Value Date/Time  CHOL 127 03/21/2012  1:26 AM    Results for orders placed during the hospital encounter of 03/20/12 (from the past 48 hour(s))  CBC     Status: Abnormal   Collection Time   03/20/12  6:11 PM      Component Value Range Comment   WBC 17.4 (*) 4.0 - 10.5 K/uL    RBC 4.84  3.87 - 5.11 MIL/uL    Hemoglobin 14.8  12.0 - 15.0 g/dL    HCT 45.4  09.8 - 11.9 %    MCV 90.9  78.0 - 100.0 fL    MCH 30.6  26.0 - 34.0 pg    MCHC 33.6  30.0 - 36.0 g/dL    RDW 14.7  82.9 - 56.2 %    Platelets 381  150 - 400 K/uL   CREATININE, SERUM     Status: Abnormal   Collection Time   03/20/12  6:11 PM      Component Value Range Comment   Creatinine, Ser 0.80  0.50 - 1.10 mg/dL    GFR calc non Af Amer 63 (*) >90 mL/min    GFR calc Af Amer 72 (*) >90 mL/min   TSH     Status: Normal   Collection Time   03/20/12  6:11 PM      Component Value Range Comment   TSH 0.924  0.350 - 4.500 uIU/mL   GLUCOSE, CAPILLARY     Status: Abnormal   Collection Time   03/20/12  6:11 PM      Component Value Range Comment   Glucose-Capillary 224 (*) 70 - 99 mg/dL    Comment 1 Documented in Chart      Comment 2 Notify RN     CARDIAC PANEL(CRET KIN+CKTOT+MB+TROPI)     Status: Normal   Collection Time   03/20/12  6:20 PM      Component Value Range Comment   Total CK 44  7 - 177 U/L    CK, MB 2.3  0.3 - 4.0 ng/mL    Troponin I <0.30  <0.30 ng/mL    Relative Index RELATIVE INDEX IS INVALID  0.0 - 2.5   GLUCOSE, CAPILLARY      Status: Abnormal   Collection Time   03/20/12 10:12 PM      Component Value Range Comment   Glucose-Capillary 169 (*) 70 - 99 mg/dL   HEMOGLOBIN Z3Y     Status: Abnormal   Collection Time   03/21/12  1:26 AM      Component Value Range Comment   Hemoglobin A1C 6.5 (*) <5.7 %    Mean Plasma Glucose 140 (*) <117 mg/dL   COMPREHENSIVE METABOLIC PANEL     Status: Abnormal   Collection Time   03/21/12  1:26 AM      Component Value Range Comment   Sodium 135  135 - 145 mEq/L    Potassium 4.0  3.5 - 5.1 mEq/L    Chloride 100  96 - 112 mEq/L    CO2 22  19 - 32 mEq/L    Glucose, Bld 153 (*) 70 - 99 mg/dL    BUN 18  6 - 23 mg/dL    Creatinine, Ser 8.65  0.50 - 1.10 mg/dL    Calcium 8.7  8.4 - 78.4 mg/dL    Total Protein 6.6  6.0 - 8.3 g/dL    Albumin 3.4 (*) 3.5 - 5.2 g/dL    AST 22  0 - 37 U/L    ALT  10  0 - 35 U/L    Alkaline Phosphatase 74  39 - 117 U/L    Total Bilirubin 0.4  0.3 - 1.2 mg/dL    GFR calc non Af Amer 71 (*) >90 mL/min    GFR calc Af Amer 83 (*) >90 mL/min   CBC     Status: Abnormal   Collection Time   03/21/12  1:26 AM      Component Value Range Comment   WBC 15.5 (*) 4.0 - 10.5 K/uL    RBC 4.43  3.87 - 5.11 MIL/uL    Hemoglobin 13.8  12.0 - 15.0 g/dL    HCT 16.1  09.6 - 04.5 %    MCV 90.7  78.0 - 100.0 fL    MCH 31.2  26.0 - 34.0 pg    MCHC 34.3  30.0 - 36.0 g/dL    RDW 40.9  81.1 - 91.4 %    Platelets 382  150 - 400 K/uL   PROTIME-INR     Status: Abnormal   Collection Time   03/21/12  1:26 AM      Component Value Range Comment   Prothrombin Time 15.3 (*) 11.6 - 15.2 seconds    INR 1.18  0.00 - 1.49   LIPID PANEL     Status: Normal   Collection Time   03/21/12  1:26 AM      Component Value Range Comment   Cholesterol 127  0 - 200 mg/dL    Triglycerides 60  <782 mg/dL    HDL 57  >95 mg/dL    Total CHOL/HDL Ratio 2.2      VLDL 12  0 - 40 mg/dL    LDL Cholesterol 58  0 - 99 mg/dL   CARDIAC PANEL(CRET KIN+CKTOT+MB+TROPI)     Status: Normal   Collection Time    03/21/12  1:27 AM      Component Value Range Comment   Total CK 40  7 - 177 U/L    CK, MB 2.0  0.3 - 4.0 ng/mL    Troponin I <0.30  <0.30 ng/mL    Relative Index RELATIVE INDEX IS INVALID  0.0 - 2.5   GLUCOSE, CAPILLARY     Status: Abnormal   Collection Time   03/21/12  6:47 AM      Component Value Range Comment   Glucose-Capillary 126 (*) 70 - 99 mg/dL   GLUCOSE, CAPILLARY     Status: Abnormal   Collection Time   03/21/12  7:14 AM      Component Value Range Comment   Glucose-Capillary 129 (*) 70 - 99 mg/dL   CARDIAC PANEL(CRET KIN+CKTOT+MB+TROPI)     Status: Normal   Collection Time   03/21/12 10:35 AM      Component Value Range Comment   Total CK 43  7 - 177 U/L    CK, MB 2.2  0.3 - 4.0 ng/mL    Troponin I <0.30  <0.30 ng/mL    Relative Index RELATIVE INDEX IS INVALID  0.0 - 2.5   GLUCOSE, CAPILLARY     Status: Abnormal   Collection Time   03/21/12 11:30 AM      Component Value Range Comment   Glucose-Capillary 144 (*) 70 - 99 mg/dL   GLUCOSE, CAPILLARY     Status: Abnormal   Collection Time   03/21/12  4:12 PM      Component Value Range Comment   Glucose-Capillary 298 (*) 70 - 99 mg/dL    Comment  1 Documented in Chart      Comment 2 Notify RN     GLUCOSE, CAPILLARY     Status: Abnormal   Collection Time   03/21/12  9:16 PM      Component Value Range Comment   Glucose-Capillary 144 (*) 70 - 99 mg/dL    Comment 1 Notify RN     GLUCOSE, CAPILLARY     Status: Abnormal   Collection Time   03/22/12 11:51 AM      Component Value Range Comment   Glucose-Capillary 149 (*) 70 - 99 mg/dL     Dg Chest 2 View  9/60/4540  *RADIOLOGY REPORT*  Clinical Data: Dizziness and shortness of breath.  CHEST - 2 VIEW  Comparison: 03/20/2012.  Findings: The cardiac silhouette, mediastinal and hilar contours are stable.  The pacer wires are stable.  Suspect interstitial pulmonary edema with Kerley B lines.  Persistent areas of atelectasis.  No definite pleural effusion.  IMPRESSION: CHF with  interstitial edema and persistent areas of atelectasis.   Original Report Authenticated By: P. Loralie Champagne, M.D.    Dg Chest Port 1 View  03/20/2012  *RADIOLOGY REPORT*  Clinical Data: Dizziness, weakness, possible pneumonia  PORTABLE CHEST - 1 VIEW  Comparison: 05/10/2011  Findings: Low lung volumes with vascular crowding.  Increased interstitial markings, likely chronic.  Mild patchy bilateral lower lobe opacities, favored to reflect atelectasis. Medial right lower lobe pneumonia is not entirely excluded.  No pleural effusion or pneumothorax.  Mild cardiomegaly.  Left subclavian pacemaker.  Right shoulder arthroplasty.  IMPRESSION: Mild patchy bilateral lower lobe opacities, favored to reflect atelectasis.  Medial right lower lobe pneumonia is not entirely excluded but is considered less likely.  Mild cardiomegaly.  These results were called by telephone on 03/20/2012 at 1555 hrs to Dr Pricilla Loveless, who verbally acknowledged these results.   Original Report Authenticated By: Charline Bills, M.D.      Assessment/Plan:    78 YO female with known stroke factors of DM, CKD, hyperlipidemia and heart block S/p pacer with sudden onset right to left vertigo followed by persistent imbalance to the right.  Possible right cerebellar infarct given her risk factors.  2D Echo pending.   Recommend: 1) PT/OT for balance and gait 2) Change ASA to Plavix 75 mg daily 3) Continue statin 4) Continue meclizine for symptomatic treatment of vertigo.  5) Repeat CT head in 24-48 hours.   Felicie Morn PA-C [facility] [phone]  03/22/2012, 2:35 PM

## 2012-03-22 NOTE — Progress Notes (Signed)
Physical Therapy Treatment Patient Details Name: Lynn Weeks MRN: [id number] DOB: 12-23-1918 Today's Date: 03/22/2012 Time: 3244-0102 PT Time Calculation (min): 35 min  PT Assessment / Plan / Recommendation Comments on Treatment Session  Performed bil Hallpike-Dix and supine headroll test to assess for BPPV in posterior or horizontal canals; pt denied spinning dizziness, no nystagmus except with Rt supine head roll with Lt horizontal nystagmus noted with duration > 1 minute (again without vertigo but with reports of dizziness). 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 Noted pt cannot have MRI to r/o CVA, ? if repeat CT would be of benefit once pt >48 hrs out to show possible ischemic event. Also recommend Rehab Consult as pt will need intensive therapy prior to d/c home.    Follow Up Recommendations  Inpatient Rehab;Supervision/Assistance - 24 hour    Barriers to Discharge        Equipment Recommendations  Defer to next venue    Recommendations for Other Services Rehab consult  Frequency Min 4X/week   Plan Discharge plan needs to be updated;Frequency needs to be updated    Precautions / Restrictions Precautions Precautions: Fall   Pertinent Vitals/Pain Reported headache in posterior region    Mobility  Bed Mobility Bed Mobility: Rolling Right;Rolling Left;Right Sidelying to Sit Rolling Right: 5: Supervision;With rail Rolling Left: 5: Supervision;With rail Right Sidelying to Sit: 3: Mod assist;With rails;HOB flat Details for Bed Mobility Assistance: denied dizziness with rolling left and right; required incr assist to come to sit due to strong lean to Rt (denied vertigo..."I just can't get my balance") Transfers Transfers: Sit to Stand;Stand to Sit Sit to Stand: 3: Mod assist;With upper extremity assist;From  bed Stand to Sit: 1: +2 Total assist;To chair/3-in-1;With upper extremity assist;With armrests Stand to Sit: Patient Percentage: 40% Details for Transfer Assistance: pt with strong lean to Right and unable to correct although she is aware she is leaning to the Rt. Stand to sit after ambulation and pt more fatigued and incr lean requiring incr assist to align herself with chair and reach to find armrest Ambulation/Gait Ambulation/Gait Assistance: 1: +2 Total assist Ambulation/Gait: Patient Percentage: 50% Ambulation Distance (Feet): 20 Feet Assistive device: Rolling walker;2 person hand held assist Ambulation/Gait Assistance Details: attempted use of RW for pt to ? utilize UEs to balance herself, however pt still not able to right herself over her base of support; removed RW after 10 ft and proceeded with bil HHA with increasisng Rt lean as pt progressed/fatigued. Rt LE with near scissoring as pt fatigued Gait Pattern: Step-to pattern;Decreased step length - right;Narrow base of support    Exercises     PT Diagnosis:    PT Problem List:   PT Treatment Interventions:     PT Goals Acute Rehab PT Goals Pt will go Supine/Side to Sit: with modified independence PT Goal: Supine/Side to Sit - Progress: Progressing toward goal Pt will go Sit to Stand: with supervision PT Goal: Sit to Stand - Progress: Progressing toward goal Pt will go Stand to Sit: with supervision PT Goal: Stand to Sit - Progress: Progressing toward goal Pt will Transfer Bed to Chair/Chair to Bed: with supervision PT Transfer Goal: Bed to Chair/Chair to Bed - Progress: Progressing toward goal Pt will Stand: with supervision;6 - 10 min;with unilateral upper extremity support PT Goal: Stand - Progress: Progressing toward goal Pt  will Ambulate: 51 - 150 feet;with supervision;with least restrictive assistive device PT Goal: Ambulate - Progress: Progressing toward goal  Visit Information  Last PT Received On:  03/22/12 Assistance Needed: +2    Subjective Data  Subjective: I just keep going to the right and I can't help it Patient Stated Goal: feel better and go home   Cognition  Overall Cognitive Status: Appears within functional limits for tasks assessed/performed Arousal/Alertness: Awake/alert Orientation Level: Appears intact for tasks assessed Behavior During Session: [facility] for tasks performed    Balance  Balance Balance Assessed: Yes Static Sitting Balance Static Sitting - Balance Support: No upper extremity supported;Feet supported Static Sitting - Level of Assistance: 3: Mod assist;2: Max assist Static Sitting - Comment/# of Minutes: EOB 5 minutes with pt able to initiate wtshift to midline (from Rt lean), however unable to maintain and immediately shifts back to her Rt Static Standing Balance Static Standing - Balance Support: Bilateral upper extremity supported Static Standing - Level of Assistance:  (t=50%) Static Standing - Comment/# of Minutes: Rt lean with inabilityt to correct without assist  End of Session    GP     Kaile Bixler 03/22/2012, 10:27 AM Pager [phone]

## 2012-03-22 NOTE — Progress Notes (Signed)
[facility] PROGRESS NOTE  Lynn SHANNAHAN JXB:[id number] DOB: [birth year] DOA: 03/20/2012   Assessment/Plan: Patient Active Hospital Problem List: Near syncope (03/20/2012) -She continues to have dizziness, continue to fall to the right side. I will started her on meclizine.  -Physical therapy recommended CIR consult. Check vitamin B12, RBC folate and TSH. -Her carotid Doppler was negative for any ICA stenosis.  -Neurology consult  Hyperlipemia (05/08/2011) -continue statins.  Type 2 diabetes (05/08/2011) -blood glucose control today.  -Continue current treatment.  Acute on Chronic kidney disease (CKD), stage III (moderate) (05/08/2011) -now resolved, this is most likely secondary to decreased intravascular volume .  Leukocytosis (03/20/2012) -continue to improve without any antibiotics. She continues to complain of a mild cough.  -repeated CXR mild interticial edema.  Code Status: Full  Family Communication: Patient  Disposition Plan: To be determined after physical therapy evaluation   LOS: 2 days   Procedures:  Chest x-ray mild pulmonary edema   Subjective: Patient relates her dizziness is much better. But continues to fall to the right side.  Objective: Filed Vitals:   03/21/12 1343 03/21/12 2113 03/22/12 0646 03/22/12 1019  BP: 139/57 139/46 140/96 138/68  Pulse: 70 94 79   Temp: 97.9 F (36.6 C) 98.5 F (36.9 C) 98.7 F (37.1 C)   TempSrc: Oral Oral Oral   Resp: 20 18 18    Height:      Weight:   45.813 kg (101 lb)   SpO2: 93% 92% 92%     Intake/Output Summary (Last 24 hours) at 03/22/12 1233 Last data filed at 03/22/12 0830  Gross per 24 hour  Intake    410 ml  Output   1150 ml  Net   -740 ml   Weight change: -0.363 kg (-12.8 oz)  Exam:  General: Alert, awake, oriented x3, in no acute distress.  Heart: Regular rate and rhythm, without murmurs, rubs, gallops.  Lungs: Good air movement, bilateral air movement.  Abdomen: Soft, nontender,  nondistended, positive bowel sounds.  Neuro: Finger to nose continues to be normal, muscle strength is 5/5 on all 4 extremity. Patient continue to fall to the right side.   Data Reviewed: Basic Metabolic Panel:  Lab 03/21/12 1308 03/20/12 1811 03/20/12 1252  NA 135 -- 136  K 4.0 -- 4.1  CL 100 -- 101  CO2 22 -- 24  GLUCOSE 153* -- 239*  BUN 18 -- 23  CREATININE 0.77 0.80 0.92  CALCIUM 8.7 -- 9.2  MG -- -- --  PHOS -- -- --   Liver Function Tests:  Lab 03/21/12 0126  AST 22  ALT 10  ALKPHOS 74  BILITOT 0.4  PROT 6.6  ALBUMIN 3.4*   No results found for this basename: LIPASE:5,AMYLASE:5 in the last 168 hours No results found for this basename: AMMONIA:5 in the last 168 hours CBC:  Lab 03/21/12 0126 03/20/12 1811 03/20/12 1252  WBC 15.5* 17.4* 19.2*  NEUTROABS -- -- 17.2*  HGB 13.8 14.8 14.1  HCT 40.2 44.0 41.8  MCV 90.7 90.9 91.5  PLT 382 381 362   Cardiac Enzymes:  Lab 03/21/12 1035 03/21/12 0127 03/20/12 1820  CKTOTAL 43 40 44  CKMB 2.2 2.0 2.3  CKMBINDEX -- -- --  TROPONINI <0.30 <0.30 <0.30   BNP: No components found with this basename: POCBNP:5 CBG:  Lab 03/22/12 1151 03/21/12 2116 03/21/12 1612 03/21/12 1130 03/21/12 0714  GLUCAP 149* 144* 298* 144* 129*    No results found for this or any previous visit (from  the past 240 hour(s)).   Studies: Ct Head Wo Contrast  03/20/2012  *RADIOLOGY REPORT*  Clinical Data: Dizzy.  Fell while gardening.  CT HEAD WITHOUT CONTRAST  Technique:  Contiguous axial images were obtained from the base of the skull through the vertex without contrast.  Comparison: None.  Findings: There is no evidence for acute infarction, intracranial hemorrhage, mass lesion, hydrocephalus, or extra-axial fluid. There is mild atrophy with chronic microvascular ischemic change. There is remote right caudate lacuna.  Calvarium is intact.  There is advanced vascular calcification.  No acute sinus or mastoid fluid.  IMPRESSION: Chronic changes  as described.  No skull fracture or intracranial hemorrhage.  No visible acute stroke or bleed.   Original Report Authenticated By: Elsie Stain, M.D.    Dg Chest Port 1 View  03/20/2012  *RADIOLOGY REPORT*  Clinical Data: Dizziness, weakness, possible pneumonia  PORTABLE CHEST - 1 VIEW  Comparison: 05/10/2011  Findings: Low lung volumes with vascular crowding.  Increased interstitial markings, likely chronic.  Mild patchy bilateral lower lobe opacities, favored to reflect atelectasis. Medial right lower lobe pneumonia is not entirely excluded.  No pleural effusion or pneumothorax.  Mild cardiomegaly.  Left subclavian pacemaker.  Right shoulder arthroplasty.  IMPRESSION: Mild patchy bilateral lower lobe opacities, favored to reflect atelectasis.  Medial right lower lobe pneumonia is not entirely excluded but is considered less likely.  Mild cardiomegaly.  These results were called by telephone on 03/20/2012 at 1555 hrs to Dr Pricilla Loveless, who verbally acknowledged these results.   Original Report Authenticated By: Charline Bills, M.D.     Scheduled Meds:    . aspirin  81 mg Oral Daily  . carvedilol  40 mg Oral Daily  . heparin  5,000 Units Subcutaneous Q8H  . insulin aspart  0-5 Units Subcutaneous QHS  . insulin aspart  0-9 Units Subcutaneous TID WC  . insulin glargine  5 Units Subcutaneous QHS  . lisinopril  40 mg Oral Daily  . meclizine  25 mg Oral BID  . metFORMIN  500 mg Oral BID WC  . ofloxacin  1 drop Both Eyes Daily  . simvastatin  20 mg Oral QHS  . DISCONTD: aspirin  300 mg Rectal Daily  . DISCONTD: aspirin  325 mg Oral Daily   Continuous Infusions:    . sodium chloride 125 mL/hr at 03/20/12 1259    Lambert Keto, MD  [facility] Pager [phone]  If 7PM-7AM, please contact night-coverage [email or web address] Password [email or web address] 03/22/2012, 12:33 PM

## 2012-03-22 NOTE — Progress Notes (Signed)
Clinical Social Work Department CLINICAL SOCIAL WORK PLACEMENT NOTE 03/22/2012  Patient:  Lynn Weeks, Lynn Weeks  Account Number:  [id number] Admit date:  03/20/2012  Clinical Social Worker:  Nikhil Osei Lubertha Basque  Date/time:  03/22/2012 04:08 PM  Clinical Social Work is seeking post-discharge placement for this patient at the following level of care:   SKILLED NURSING   (*CSW will update this form in Epic as items are completed)   03/22/2012  Patient/family provided with [facility] Department of Clinical Social Work's list of facilities offering this level of care within the geographic area requested by the patient (or if unable, by the patient's family).  03/22/2012  Patient/family informed of their freedom to choose among providers that offer the needed level of care, that participate in Medicare, Medicaid or managed care program needed by the patient, have an available bed and are willing to accept the patient.  03/22/2012  Patient/family informed of [facility]' ownership interest in [facility], as well as of the fact that they are under no obligation to receive care at this facility.  PASARR submitted to EDS on 03/22/2012 PASARR number received from EDS on 03/22/2012  FL2 transmitted to all facilities in geographic area requested by pt/family on  03/22/2012 FL2 transmitted to all facilities within larger geographic area on   Patient informed that his/her managed care company has contracts with or will negotiate with  certain facilities, including the following:     Patient/family informed of bed offers received:   Patient chooses bed at  Physician recommends and patient chooses bed at    Patient to be transferred to  on   Patient to be transferred to facility by   The following physician request were entered in Epic:   Additional Comments:  Sabino Niemann, MSW, [employer] [phone]

## 2012-03-22 NOTE — Progress Notes (Addendum)
Clinical Social Work Department BRIEF PSYCHOSOCIAL ASSESSMENT 03/22/2012  Patient:  Lynn Weeks, Lynn Weeks     Account Number:  [id number]     Admit date:  03/20/2012  Clinical Social Worker:  Juliette Mangle  Date/Time:  03/22/2012 02:00 PM  Referred by:  Physician  Date Referred:  03/22/2012 Referred for  SNF Placement   Other Referral:   Interview type:  Patient Other interview type:   Patient's son was present    PSYCHOSOCIAL DATA Living Status:  ALONE Admitted from facility:   Level of care:   Primary support name:  Baldus,Phill Primary support relationship to patient:  CHILD, ADULT Degree of support available:   Good    CURRENT CONCERNS Current Concerns  Post-Acute Placement   Other Concerns:    SOCIAL WORK ASSESSMENT / PLAN CSW received a referral for SNF placement. CSW met with patient and patient's son at bedside. CSW introduced self, explained role and discussed going to a SNF for possible rehab. Patient currently lives at home alone but is going to have 24 hour help from her granddaughter who is an Charity fundraiser post [employer].   Patient is currently awaiting inpatient rehab to evaluate the patient for possible treatment.  CSW explained that SNF would be a back up plan if in-patient rehab doesn't accept the patient. Patient and patient's son were agreeable to being faxed out in [facility]. Patient reported her first choice to be Doctor, hospital. CSW will continue to follow and  will start paperwork for possible SNF Placement.   Assessment/plan status:  Psychosocial Support/Ongoing Assessment of Needs Other assessment/ plan:   Information/referral to community resources:   SNF Choice list    PATIENT'S/FAMILY'S RESPONSE TO PLAN OF CARE: Patient and patient's family were very appreciative of info and support provided by CSW. CSW will continue to follow to assist with all d/c needs.         Sabino Niemann, MSW, [employer] [phone]

## 2012-03-22 NOTE — Progress Notes (Signed)
I will follow up in the morning with patient and son to discuss the possibility of an inpt rehab admission rather than SNF. [phone]

## 2012-03-22 NOTE — Progress Notes (Signed)
Pts family wants pt started back on Norvasc.  Dr. Robb Matar notified. No new orders given.  Will continue to monitor.

## 2012-03-22 NOTE — Progress Notes (Signed)
Met with pt and son re d/c needs, noted PT recommendation for inpatient rehab, both pt and son agree that this would be a good option for this pt. MD , Dr Robb Matar notified and order entered, await Inpt Rehab evaluation.  Johny Shock RN MPH [phone]

## 2012-03-22 NOTE — Consult Note (Signed)
Physical Medicine and Rehabilitation Consult Reason for Consult: Syncope Referring Physician: [facility]   HPI: Lynn Weeks is a 78 y.o. right-handed female with history of type 2 diabetes mellitus, hypertension, coronary artery disease with pacemaker as well his history of inner ear problems. Patient lives alone and was independent prior to admission driving short distances. Admitted 03/20/2012 with near syncope and dizziness. Cranial CT scan with no acute changes. Echocardiogram pending. Carotid Dopplers with no ICA stenosis. Chest x-ray with CHF with interstitial edema. Noted admission white blood cell count of 19,200 as well as review old records showing white blood cell count 19,800 in October of 2012. Placed on scheduled Antivert 25 mg twice a day for dizziness. Neurology services has been consulted and await full workup. Speech therapy followup notes cognition to be within functional limits for tasks assessed. Physical therapy ongoing noting suspect vestibular disorder and await further workup. Recommendations were made for physical medicine rehabilitation consult to consider inpatient rehabilitation services.   Review of Systems  HENT: Positive for hearing loss.   Eyes:       Blindness right eye  Musculoskeletal: Positive for myalgias.  Neurological: Positive for dizziness.  All other systems reviewed and are negative.   Past Medical History  Diagnosis Date  . Complete heart block 05/08/2011  . CAD (coronary artery disease) 05/08/2011  . Hypertensive heart disease without congestive heart failure 05/08/2011  . Chronic kidney disease (CKD), stage III (moderate) 05/08/2011  . Hyponatremia 05/08/2011  . Hyperlipemia 05/08/2011  . Type 2 diabetes 05/08/2011  . Murmur, cardiac 05/08/2011  . Osteoarthritis   . Anxiety    Past Surgical History  Procedure Date  . Tonsillectomy   . Left total knee replacement   . Abdominal hysterectomy   . Cataract extraction, bilateral   . Right  shoulder hemiarthroplasty 2005    [name]  . Pacemaker insertion    Family History  Problem Relation Age of Onset  . Stroke Mother    Social History:  reports that she has never smoked. She has never used smokeless tobacco. She reports that she does not drink alcohol or use illicit drugs. Allergies:  Allergies  Allergen Reactions  . Ciprofloxacin Nausea And Vomiting  . Crestor (Rosuvastatin Calcium) Swelling  . Escitalopram Oxalate Other (See Comments)    Yawning and insomnia   . Lipitor (Atorvastatin Calcium) Other (See Comments)    myalgia  . Metronidazole Nausea And Vomiting  . Sulfa Antibiotics Swelling   Medications Prior to Admission  Medication Sig Dispense Refill  . amLODipine (NORVASC) 5 MG tablet Take 5 mg by mouth daily.        Marland Kitchen aspirin 81 MG tablet Take 81 mg by mouth daily.        . bisacodyl (DULCOLAX) 5 MG EC tablet Take 10 mg by mouth daily as needed. For constipation      . carvedilol (COREG CR) 40 MG 24 hr capsule Take 40 mg by mouth daily.        . furosemide (LASIX) 20 MG tablet Take 10-20 mg by mouth 2 (two) times daily as needed. For swelling      . lisinopril (PRINIVIL,ZESTRIL) 40 MG tablet Take 40 mg by mouth daily.        . metFORMIN (GLUCOPHAGE) 500 MG tablet Take 500 mg by mouth 2 (two) times daily with a meal.       . ofloxacin (OCUFLOX) 0.3 % ophthalmic solution Place 1 drop into both eyes daily.       Marland Kitchen  simvastatin (ZOCOR) 20 MG tablet Take 20 mg by mouth at bedtime.          Home: Home Living Lives With: Alone Available Help at Discharge: Family;Available 24 hours/day Type of Home: House Home Access: Stairs to enter [employer] of Steps: 4 Entrance Stairs-Rails: Right Home Layout: One level Bathroom Shower/Tub:  (to be determined) Home Adaptive Equipment: Walker - standard;Straight cane  Functional History: Prior Function Able to Take Stairs?: Yes Vocation: Retired Functional Status:  Mobility: Bed Mobility Bed Mobility:  Rolling Right;Rolling Left;Right Sidelying to Sit Rolling Right: 5: Supervision;With rail Rolling Left: 5: Supervision;With rail Right Sidelying to Sit: 3: Mod assist;With rails;HOB flat Transfers Transfers: Sit to Stand;Stand to Sit Sit to Stand: 3: Mod assist;With upper extremity assist;From bed Stand to Sit: 1: +2 Total assist;To chair/3-in-1;With upper extremity assist;With armrests Stand to Sit: Patient Percentage: 40% Ambulation/Gait Ambulation/Gait Assistance: 1: +2 Total assist Ambulation/Gait: Patient Percentage: 50% Ambulation Distance (Feet): 20 Feet Assistive device: Rolling walker;2 person hand held assist Ambulation/Gait Assistance Details: attempted use of RW for pt to ? utilize UEs to balance herself, however pt still not able to right herself over her base of support; removed RW after 10 ft and proceeded with bil HHA with increasisng Rt lean as pt progressed/fatigued. Rt LE with near scissoring as pt fatigued Gait Pattern: Step-to pattern;Decreased step length - right;Narrow base of support    ADL:    Cognition: Cognition Overall Cognitive Status: Appears within functional limits for tasks assessed Arousal/Alertness: Awake/alert Orientation Level: Oriented X4 Cognition Overall Cognitive Status: Appears within functional limits for tasks assessed/performed Arousal/Alertness: Awake/alert Orientation Level: Appears intact for tasks assessed Behavior During Session: [facility] for tasks performed  Blood pressure 138/68, pulse 79, temperature 98.7 F (37.1 C), temperature source Oral, resp. rate 18, height 4\' 6"  (1.372 m), weight 45.813 kg (101 lb), SpO2 92.00%. Physical Exam  Vitals reviewed. Constitutional: She is oriented to person, place, and time. She appears well-developed.  HENT:  Head: Normocephalic.  Eyes:       Blindness right eye  Neck: Neck supple. No thyromegaly present.  Cardiovascular:       Cardiac rate controlled  Pulmonary/Chest: Breath sounds  normal. She has no wheezes.  Abdominal: She exhibits no distension. There is no tenderness. There is no rebound.  Musculoskeletal: She exhibits no edema.  Neurological: She is alert and oriented to person, place, and time.       Patient follows full commands. She is hard of hearing on the left. Speech is clear. Cognitively she's intact. Blind in right eye. 5 beats nystagmus with right lateral gaze and 2 beats to the left. Minimal to No limb ataxia on the right. Sensation and strength appear to be within normal limits  Skin: Skin is warm and dry.  Psychiatric: She has a normal mood and affect.    Results for orders placed during the hospital encounter of 03/20/12 (from the past 24 hour(s))  GLUCOSE, CAPILLARY     Status: Abnormal   Collection Time   03/21/12  4:12 PM      Component Value Range   Glucose-Capillary 298 (*) 70 - 99 mg/dL   Comment 1 Documented in Chart     Comment 2 Notify RN    GLUCOSE, CAPILLARY     Status: Abnormal   Collection Time   03/21/12  9:16 PM      Component Value Range   Glucose-Capillary 144 (*) 70 - 99 mg/dL   Comment 1 Notify RN  GLUCOSE, CAPILLARY     Status: Abnormal   Collection Time   03/22/12 11:51 AM      Component Value Range   Glucose-Capillary 149 (*) 70 - 99 mg/dL   Dg Chest 2 View  1/61/0960  *RADIOLOGY REPORT*  Clinical Data: Dizziness and shortness of breath.  CHEST - 2 VIEW  Comparison: 03/20/2012.  Findings: The cardiac silhouette, mediastinal and hilar contours are stable.  The pacer wires are stable.  Suspect interstitial pulmonary edema with Kerley B lines.  Persistent areas of atelectasis.  No definite pleural effusion.  IMPRESSION: CHF with interstitial edema and persistent areas of atelectasis.   Original Report Authenticated By: P. Loralie Champagne, M.D.    Ct Head Wo Contrast  03/20/2012  *RADIOLOGY REPORT*  Clinical Data: Dizzy.  Fell while gardening.  CT HEAD WITHOUT CONTRAST  Technique:  Contiguous axial images were obtained from  the base of the skull through the vertex without contrast.  Comparison: None.  Findings: There is no evidence for acute infarction, intracranial hemorrhage, mass lesion, hydrocephalus, or extra-axial fluid. There is mild atrophy with chronic microvascular ischemic change. There is remote right caudate lacuna.  Calvarium is intact.  There is advanced vascular calcification.  No acute sinus or mastoid fluid.  IMPRESSION: Chronic changes as described.  No skull fracture or intracranial hemorrhage.  No visible acute stroke or bleed.   Original Report Authenticated By: Elsie Stain, M.D.    Dg Chest Port 1 View  03/20/2012  *RADIOLOGY REPORT*  Clinical Data: Dizziness, weakness, possible pneumonia  PORTABLE CHEST - 1 VIEW  Comparison: 05/10/2011  Findings: Low lung volumes with vascular crowding.  Increased interstitial markings, likely chronic.  Mild patchy bilateral lower lobe opacities, favored to reflect atelectasis. Medial right lower lobe pneumonia is not entirely excluded.  No pleural effusion or pneumothorax.  Mild cardiomegaly.  Left subclavian pacemaker.  Right shoulder arthroplasty.  IMPRESSION: Mild patchy bilateral lower lobe opacities, favored to reflect atelectasis.  Medial right lower lobe pneumonia is not entirely excluded but is considered less likely.  Mild cardiomegaly.  These results were called by telephone on 03/20/2012 at 1555 hrs to Dr Pricilla Loveless, who verbally acknowledged these results.   Original Report Authenticated By: Charline Bills, M.D.     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 Does the need for close, 24 hr/day medical supervision in concert with the patient's rehab needs make it unreasonable for this patient to be served in a less intensive setting? Yes 2. Co-Morbidities requiring supervision/potential complications: dm, ckd III, cad 3. Due to bladder management, bowel management, safety, skin/wound care,  disease management, medication administration, pain management and patient education, does the patient require 24 hr/day rehab nursing? Yes 4. Does the patient require coordinated care of a physician, rehab nurse, PT (1-2 hrs/day, 5 days/week) and OT (1-2 hrs/day, 5 days/week) to address physical and functional deficits in the context of the above medical diagnosis(es)? Yes Addressing deficits in the following areas: balance, endurance, locomotion, strength, transferring, bowel/bladder control, bathing, dressing, feeding, grooming, toileting and psychosocial support 5. Can the patient actively participate in an intensive therapy program of at least 3 hrs of therapy per day at least 5 days per week? Yes 6. The potential for patient to make measurable gains while on inpatient rehab is excellent 7. Anticipated functional outcomes upon discharge from inpatient rehab are mod I to supervision with PT, mod I to supervision with OT, n/a with SLP. 8. Estimated rehab length  of stay to reach the above functional goals is: 2 weeks 9. Does the patient have adequate social supports to accommodate these discharge functional goals? Yes 10. Anticipated D/C setting: Home 11. Anticipated post D/C treatments: Outpt therapy 12. Overall Rehab/Functional Prognosis: excellent  RECOMMENDATIONS: This patient's condition is appropriate for continued rehabilitative care in the following setting: CIR Patient has agreed to participate in recommended program. Yes Note that insurance prior authorization may be required for reimbursement for recommended care.  Comment:Rehab RN to follow up.   Ivory Broad, MD     03/22/2012

## 2012-03-22 NOTE — Evaluation (Addendum)
Occupational Therapy Evaluation Patient Details Name: Lynn Weeks MRN: [id number] DOB: 03/27/1919 Today's Date: 03/22/2012 Time: 3329-5188 OT Time Calculation (min): 36 min  OT Assessment / Plan / Recommendation Clinical Impression  This 78 yo female admitted with near syncope, fall, dizziness presents to acute OT with the problems below. Will benefit from acute OT with follow up on inpatient reahab    OT Assessment  Patient needs continued OT Services    Follow Up Recommendations  Inpatient Rehab    Barriers to Discharge None    Equipment Recommendations  Defer to next venue    Recommendations for Other Services    Frequency  Min 3X/week    Precautions / Restrictions Precautions Precautions: Fall Restrictions Weight Bearing Restrictions: No       ADL  Eating/Feeding: Simulated;Independent Where Assessed - Eating/Feeding: Bed level (or supported sit) Grooming: Simulated;Set up;Supervision/safety Where Assessed - Grooming: Supported sitting Upper Body Bathing: Simulated;Set up;Supervision/safety Where Assessed - Upper Body Bathing: Supported sitting Lower Body Bathing: Moderate assistance;Simulated (due to balance, with total A to stand dynamically) Where Assessed - Lower Body Bathing: Supported sit to stand Upper Body Dressing: Simulated;Maximal assistance (due to balance issues) Where Assessed - Upper Body Dressing: Supported sitting Lower Body Dressing: Performed;Min guard (to donn socks; however balance was all over the place) Where Assessed - Lower Body Dressing: Supported sitting Toilet Transfer: Simulated;Maximal assistance Toilet Transfer Method: Sit to Barista:  (recliner, two steps forward and two steps back to recliner) Toileting - Clothing Manipulation and Hygiene: Simulated;Minimal assistance (with total A for standing dynamic balance) Where Assessed - Toileting Clothing Manipulation and Hygiene: Standing Equipment Used: Gait  belt Transfers/Ambulation Related to ADLs: Max A for transfers ADL Comments: When pt brings her back off of the back of the recliner she immediately starts to lean to the right. Once she was assisted to get static sitting balance with miin A and Mod verbal cues for weight shifts I asked her to take her socks off; she immediately crossed one leg over the other and lost her balace to the right., continued to remove sock with one hand (when she put it back on with both hands her sitting balance improved with this bil task. Same thing happened with the other leg. Stood patient and more of her weight was on ther RLE. Asked her if her weight was equal on both feet and she said no, asked her  fix it and find her balance and she did with only min guard A; however if I asked her to look in any direction (other than to ther left) she would immediately start falling to the right--when she looke to the left it actually helped her balance.    OT Diagnosis: Generalized weakness;Disturbance of vision  OT Problem List: Decreased strength;Impaired balance (sitting and/or standing);Impaired vision/perception OT Treatment Interventions: Self-care/ADL training;Neuromuscular education;DME and/or AE instruction;Therapeutic activities;Patient/family education;Visual/perceptual remediation/compensation;Balance training   OT Goals Acute Rehab OT Goals OT Goal Formulation: With patient Time For Goal Achievement: 03/29/12 Potential to Achieve Goals: Good ADL Goals Pt Will Perform Grooming: with set-up;with supervision;Unsupported;Sitting, edge of bed;Standing at sink (1 task with Min A for balance) ADL Goal: Grooming - Progress: Goal set today Pt Will Transfer to Toilet: with min assist;Stand pivot transfer;3-in-1 ADL Goal: Toilet Transfer - Progress: Goal set today Miscellaneous OT Goals Miscellaneous OT Goal #1: Pt will be able to roll left and right and come up to sit with supervision OT Goal: Miscellaneous Goal #1 -  Progress: Goal  set today Miscellaneous OT Goal #2: Pt will be able to sit EOB with Min A for grooming task OT Goal: Miscellaneous Goal #2 - Progress: Goal set today Miscellaneous OT Goal #3: Pt will be able to go from sit to stand with Min A and maintaining dynamic standing balance (head turns only) with min A OT Goal: Miscellaneous Goal #3 - Progress: Goal set today  Visit Information  Last OT Received On: 03/22/12 Assistance Needed: +2    Subjective Data  Subjective: I fall to the right when I sit up or stand up Patient Stated Goal: Be able to take care of myself again   Prior Functioning  Vision/Perception  Home Living Lives With: Alone Available Help at Discharge: Family;Available 24 hours/day (not right now, but working on it) Type of Home: House Home Access: Stairs to enter Secretary/administrator of Steps: 4 Entrance Stairs-Rails: Right Home Layout: One level Bathroom Shower/Tub: Engineer, manufacturing systems: Standard Home Adaptive Equipment: Environmental consultant - standard;Straight cane Prior Function Level of Independence: Independent Able to Take Stairs?: Yes Vocation: Retired Musician: HOH Dominant Hand: Right   Vision - Assessment Eye Alignment: Within Functional Limits Vision Assessment: Vision tested Ocular Range of Motion: Within Functional Limits Tracking/Visual Pursuits: Decreased smoothness of vertical tracking;Requires cues, head turns, or add eye shifts to track (looses object in superior and inferior fields to the left) Additional Comments: Pt was able to read the bold print on her menu without difficulty without head turns or turning menu booklet  Cognition  Overall Cognitive Status: Appears within functional limits for tasks assessed/performed Arousal/Alertness: Awake/alert Orientation Level: Appears intact for tasks assessed Behavior During Session: [facility] for tasks performed    Extremity/Trunk Assessment Right Upper Extremity  Assessment RUE ROM/Strength/Tone: Within functional levels Left Upper Extremity Assessment LUE ROM/Strength/Tone: Within functional levels   Mobility Transfers Transfers: Sit to Stand;Stand to Sit Sit to Stand: 3: Mod assist;With upper extremity assist;With armrests;From chair/3-in-1 Stand to Sit: 2: Max assist;With upper extremity assist;With armrests;To chair/3-in-1 Details for Transfer Assistance: leans to the right for sit to stand and stand to sit   Exercise    Balance    End of Session OT - End of Session Equipment Utilized During Treatment: Gait belt Activity Tolerance: Patient tolerated treatment well Patient left: in chair;with call bell/phone within reach;with family/visitor present (son that works here)       Evette Georges [phone] 03/22/2012, 2:06 PM

## 2012-03-23 ENCOUNTER — Inpatient Hospital Stay (HOSPITAL_COMMUNITY): Payer: Medicare Other

## 2012-03-23 ENCOUNTER — Inpatient Hospital Stay (HOSPITAL_COMMUNITY)
Admission: RE | Admit: 2012-03-23 | Discharge: 2012-04-06 | DRG: 945 | Disposition: A | Discharge status: Home Health | Payer: Medicare Other | Source: Ambulatory Visit | Attending: Physical Medicine & Rehabilitation | Admitting: Physical Medicine & Rehabilitation

## 2012-03-23 DIAGNOSIS — E119 Type 2 diabetes mellitus without complications: Secondary | ICD-10-CM | POA: Diagnosis not present

## 2012-03-23 DIAGNOSIS — H5440 Blindness, one eye, unspecified eye: Secondary | ICD-10-CM | POA: Diagnosis not present

## 2012-03-23 DIAGNOSIS — Z96659 Presence of unspecified artificial knee joint: Secondary | ICD-10-CM

## 2012-03-23 DIAGNOSIS — Z5189 Encounter for other specified aftercare: Secondary | ICD-10-CM

## 2012-03-23 DIAGNOSIS — Z7982 Long term (current) use of aspirin: Secondary | ICD-10-CM

## 2012-03-23 DIAGNOSIS — R42 Dizziness and giddiness: Secondary | ICD-10-CM

## 2012-03-23 DIAGNOSIS — I639 Cerebral infarction, unspecified: Secondary | ICD-10-CM

## 2012-03-23 DIAGNOSIS — K5900 Constipation, unspecified: Secondary | ICD-10-CM | POA: Diagnosis present

## 2012-03-23 DIAGNOSIS — I10 Essential (primary) hypertension: Secondary | ICD-10-CM | POA: Diagnosis present

## 2012-03-23 DIAGNOSIS — I6789 Other cerebrovascular disease: Secondary | ICD-10-CM | POA: Diagnosis not present

## 2012-03-23 DIAGNOSIS — D72829 Elevated white blood cell count, unspecified: Secondary | ICD-10-CM | POA: Diagnosis not present

## 2012-03-23 DIAGNOSIS — I6330 Cerebral infarction due to thrombosis of unspecified cerebral artery: Secondary | ICD-10-CM | POA: Diagnosis not present

## 2012-03-23 DIAGNOSIS — I2510 Atherosclerotic heart disease of native coronary artery without angina pectoris: Secondary | ICD-10-CM | POA: Diagnosis not present

## 2012-03-23 DIAGNOSIS — R5381 Other malaise: Secondary | ICD-10-CM

## 2012-03-23 DIAGNOSIS — I6350 Cerebral infarction due to unspecified occlusion or stenosis of unspecified cerebral artery: Secondary | ICD-10-CM | POA: Diagnosis not present

## 2012-03-23 DIAGNOSIS — E785 Hyperlipidemia, unspecified: Secondary | ICD-10-CM | POA: Diagnosis not present

## 2012-03-23 DIAGNOSIS — Z950 Presence of cardiac pacemaker: Secondary | ICD-10-CM

## 2012-03-23 DIAGNOSIS — I69993 Ataxia following unspecified cerebrovascular disease: Secondary | ICD-10-CM | POA: Diagnosis not present

## 2012-03-23 LAB — FOLATE RBC: RBC Folate: 1150 ng/mL — ABNORMAL HIGH (ref >=366)

## 2012-03-23 LAB — GLUCOSE, CAPILLARY
Glucose-Capillary: 111 mg/dL — ABNORMAL HIGH (ref 70–99)
Glucose-Capillary: 149 mg/dL — ABNORMAL HIGH (ref 70–99)
Glucose-Capillary: 115 mg/dL — ABNORMAL HIGH (ref 70–99)

## 2012-03-23 MED ORDER — MECLIZINE HCL 25 MG PO TABS
25.0000 mg | ORAL_TABLET | Freq: Two times a day (BID) | ORAL | Status: DC
  Administered 2012-03-23 – 2012-04-06 (×28): 25 mg via ORAL
  Filled 2012-03-23 (×30): qty 1

## 2012-03-23 MED ORDER — ONDANSETRON HCL 4 MG PO TABS
4.0000 mg | ORAL_TABLET | Freq: Four times a day (QID) | ORAL | Status: DC | PRN
  Administered 2012-04-06 (×2): 4 mg via ORAL
  Filled 2012-03-23 (×2): qty 1

## 2012-03-23 MED ORDER — LISINOPRIL 40 MG PO TABS
40.0000 mg | ORAL_TABLET | Freq: Every day | ORAL | Status: DC
  Administered 2012-03-24 – 2012-04-06 (×12): 40 mg via ORAL
  Filled 2012-03-23 (×15): qty 1

## 2012-03-23 MED ORDER — OFLOXACIN 0.3 % OP SOLN
1.0000 [drp] | Freq: Every day | OPHTHALMIC | Status: DC
  Administered 2012-03-24 – 2012-04-06 (×13): 1 [drp] via OPHTHALMIC
  Filled 2012-03-23: qty 5

## 2012-03-23 MED ORDER — SORBITOL 70 % SOLN
30.0000 mL | Freq: Every day | Status: DC | PRN
  Administered 2012-03-25 – 2012-03-29 (×2): 30 mL via ORAL
  Filled 2012-03-23 (×2): qty 30

## 2012-03-23 MED ORDER — CLOPIDOGREL BISULFATE 75 MG PO TABS
75.0000 mg | ORAL_TABLET | Freq: Every day | ORAL | Status: DC
  Administered 2012-03-24 – 2012-04-06 (×14): 75 mg via ORAL
  Filled 2012-03-23 (×15): qty 1

## 2012-03-23 MED ORDER — CARVEDILOL PHOSPHATE ER 40 MG PO CP24
40.0000 mg | ORAL_CAPSULE | Freq: Every day | ORAL | Status: DC
  Administered 2012-03-24: 40 mg via ORAL
  Filled 2012-03-23 (×2): qty 1

## 2012-03-23 MED ORDER — HEPARIN SODIUM (PORCINE) 5000 UNIT/ML IJ SOLN
5000.0000 [IU] | Freq: Three times a day (TID) | INTRAMUSCULAR | Status: DC
  Administered 2012-03-23 – 2012-04-06 (×41): 5000 [IU] via SUBCUTANEOUS
  Filled 2012-03-23 (×44): qty 1

## 2012-03-23 MED ORDER — METFORMIN HCL 500 MG PO TABS
500.0000 mg | ORAL_TABLET | Freq: Two times a day (BID) | ORAL | Status: DC
  Administered 2012-03-24 – 2012-04-01 (×17): 500 mg via ORAL
  Filled 2012-03-23 (×19): qty 1

## 2012-03-23 MED ORDER — INSULIN ASPART 100 UNIT/ML ~~LOC~~ SOLN: 0.0000 [IU] | Freq: Every day | SUBCUTANEOUS | Status: DC

## 2012-03-23 MED ORDER — ACETAMINOPHEN 325 MG PO TABS
325.0000 mg | ORAL_TABLET | ORAL | Status: DC | PRN
  Administered 2012-03-24 – 2012-04-06 (×10): 650 mg via ORAL
  Filled 2012-03-23 (×11): qty 2

## 2012-03-23 MED ORDER — ONDANSETRON HCL 4 MG/2ML IJ SOLN: 4.0000 mg | Freq: Four times a day (QID) | INTRAMUSCULAR | Status: DC | PRN

## 2012-03-23 MED ORDER — POLYETHYLENE GLYCOL 3350 17 G PO PACK
17.0000 g | PACK | Freq: Every day | ORAL | Status: DC | PRN
  Administered 2012-03-24: 17 g via ORAL
  Filled 2012-03-23: qty 1

## 2012-03-23 MED ORDER — SIMVASTATIN 20 MG PO TABS
20.0000 mg | ORAL_TABLET | Freq: Every day | ORAL | Status: DC
  Administered 2012-03-23 – 2012-04-05 (×14): 20 mg via ORAL
  Filled 2012-03-23 (×16): qty 1

## 2012-03-23 NOTE — Progress Notes (Signed)
Pt arrived to room 4027 from 4700 via W/C. Alert and oriented x4; denies pain; VSS. Oriented to room and rehab process.  No family present at this time.  Reort to eve shift. Lynn Weeks

## 2012-03-23 NOTE — Progress Notes (Signed)
Physical Therapy Treatment Patient Details Name: Lynn Weeks MRN: [id number] DOB: May 24, 1919 Today's Date: 03/23/2012 Time: 7829-5621 PT Time Calculation (min): 17 min  PT Assessment / Plan / Recommendation Comments on Treatment Session  Pt very motivated & eager to participate in therapy.  Cont's to have significant Rt lean more so with standing/ambulation.  Still highly recommend CIR prior to d/c home to maximize balance, functional mobility, & safety.      Follow Up Recommendations  Inpatient Rehab;Supervision/Assistance - 24 hour    Barriers to Discharge        Equipment Recommendations  Defer to next venue    Recommendations for Other Services Rehab consult  Frequency Min 4X/week   Plan      Precautions / Restrictions Precautions Precautions: Fall Restrictions Weight Bearing Restrictions: No       Mobility  Bed Mobility Bed Mobility: Sitting - Scoot to Edge of Bed;Supine to Sit Supine to Sit: HOB flat;With rails;4: Min assist Sitting - Scoot to Edge of Bed: 4: Min guard Details for Bed Mobility Assistance: Minor assist to lift shoulders/trunk to sitting upright.  Cues for hand placement & technique to increase ease of transition.  Denied dizziness.  Cues for postural alignment in midline while sitting EOB- able to correct without physical assist.   Transfers Transfers: Sit to Stand;Stand to Sit Sit to Stand: 3: Mod assist;With upper extremity assist;From bed Stand to Sit: With upper extremity assist;With armrests;To chair/3-in-1;2: Max assist Details for Transfer Assistance: Assist for balance due to Rt lean.  Cues for technique & lateral weight shifting to Lt to correct Rt lean.   Ambulation/Gait Ambulation/Gait Assistance: 1: +2 Total assist Ambulation/Gait: Patient Percentage: 50% Ambulation Distance (Feet): 60 Feet Assistive device: Rolling walker;2 person hand held assist Ambulation/Gait Assistance Details: Attempted RW at first again but due to  significant Rt lateral lean & difficulty keeping herself over BOS removed RW & performed bil HHA as did in last PT session.  As pt pivoting towards recliner she had significant LOB/lean to rt requiring increased assist for balance.   Had pt perform static standing in front of mirror after ~10' so she could correct herself visually.  Assist for balance, lateral weight shifting towards Lt, & safety.   Gait Pattern: Step-through pattern;Decreased stride length;Lateral trunk lean to right;Wide base of support Stairs: No Wheelchair Mobility Wheelchair Mobility: No      PT Goals Acute Rehab PT Goals Time For Goal Achievement: 04/04/12 Potential to Achieve Goals: Good PT Goal: Supine/Side to Sit - Progress: Progressing toward goal PT Goal: Sit to Stand - Progress: Progressing toward goal PT Goal: Stand to Sit - Progress: Progressing toward goal PT Goal: Ambulate - Progress: Progressing toward goal  Visit Information  Last PT Received On: 03/23/12 Assistance Needed: +2    Subjective Data  Subjective: "I know i'm going to the right but I can't fix it" Patient Stated Goal: feel better and go home   Cognition  Overall Cognitive Status: Appears within functional limits for tasks assessed/performed Arousal/Alertness: Awake/alert Orientation Level: Appears intact for tasks assessed Behavior During Session: [facility] for tasks performed    Balance  Static Standing Balance Static Standing - Balance Support: Bilateral upper extremity supported Static Standing - Level of Assistance: 1: +2 Total assist Static Standing - Comment/# of Minutes: Had pt stand in front of mirror at sink so she could see deficit.  Pt corrected lean with minor manual faciltation & mod cueing.    End of Session PT - End  of Session Equipment Utilized During Treatment: Gait belt Activity Tolerance: Patient tolerated treatment well Patient left: in chair;with call bell/phone within reach Nurse Communication: Mobility status      Verdell Face, Khloie [phone] 03/23/2012

## 2012-03-23 NOTE — PMR Pre-admission (Signed)
PMR Admission Coordinator Pre-Admission Assessment  Patient: Lynn Weeks is an 78 y.o., female MRN: [id number] DOB: 07/04/1919 Height: 4\' 6"  (137.2 cm) Weight: 46.448 kg (102 lb 6.4 oz) (bed scale)  Insurance Information HMO:     PPO:      PCP:      IPA:      80/20: yes     OTHER: no HMO PRIMARY: Medicare a and b      Policy#: [id number]      Subscriber: pt CM Name:       Phone#:      Fax#:  Pre-Cert#:       Employer:  Benefits:  Phone #: vision share     Name: 03/22/12 Eff. Date: 07/28/85     Deduct: $1184      Out of Pocket Max: none      Life Max: none CIR: 100%      SNF: 20 full days LBD 05/10/11 Outpatient: 80%     Co-Pay: 20% Home Health: 100%      Co-Pay: none DME: 80%     Co-Pay: 20% Providers: in netwrok  SECONDARY: [employer]      Policy#: [id number]      Subscriber: pt No auth needed  Medicaid Application Date:       Case Manager:  Disability Application Date:       Case Worker:   Emergency Contact Information Contact Information    Name Relation Home Work Mobile   Pippin,Carmen Grandaughter [phone]     [place], [place] [phone]       Current Medical History  Patient Admitting Diagnosis: 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 right-handed female with history of type 2 diabetes mellitus, hypertension, coronary artery disease with pacemaker as well his history of inner ear problems. Patient lives alone and was independent prior to admission driving short distances. Admitted 03/20/2012 with near syncope and dizziness. Cranial CT scan with no acute changes. Echocardiogram pending. Carotid Dopplers with no ICA stenosis. Chest x-ray with CHF with interstitial edema. Noted admission white blood cell count of 19,200 as well as review old records showing white blood cell count 19,800 in October of 2012. Placed on scheduled Antivert 25 mg twice a day for dizziness. Neurology  services has been consulted and await full workup. Speech therapy followup notes cognition to be within functional limits for tasks assessed. Physical therapy ongoing noting suspect vestibular disorder and await further workup.   Neurology followup 8/26 change ASA to Plavix, continue statin, continue meclizine for symptomatic treatment of vertigo. Repeat CT head in 24 - 48 hrs.CT 8/27IMPRESSION: Acute infarct involving the right cerebellar hemisphere. No hemorrhage is seen. These changes are new from the previous exam    Past Medical History  Past Medical History  Diagnosis Date  . Complete heart block 05/08/2011  . CAD (coronary artery disease) 05/08/2011  . Hypertensive heart disease without congestive heart failure 05/08/2011  . Hyponatremia 05/08/2011  . Hyperlipemia 05/08/2011  . Type 2 diabetes 05/08/2011  . Murmur, cardiac 05/08/2011  . Osteoarthritis   . Anxiety     Family History  family history includes Stroke in her mother.  Prior Rehab/Hospitalizations: home health only  Current Medications  Current facility-administered medications:0.9 %  sodium chloride infusion, , Intravenous, Continuous, Marinda Elk, MD, Last Rate: 125 mL/hr at 03/20/12 1259;  acetaminophen (TYLENOL) suppository 650 mg, 650 mg, Rectal, Q6H PRN, Marinda Elk,  MD;  acetaminophen (TYLENOL) tablet 650 mg, 650 mg, Oral, Q6H PRN, Marinda Elk, MD, 650 mg at 03/21/12 0101 carvedilol (COREG CR) 24 hr capsule 40 mg, 40 mg, Oral, Daily, Marinda Elk, MD, 40 mg at 03/23/12 1107;  clopidogrel (PLAVIX) tablet 75 mg, 75 mg, Oral, Q breakfast, Felicie Morn, PA, 75 mg at 03/23/12 4098;  furosemide (LASIX) tablet 10-20 mg, 10-20 mg, Oral, BID PRN, Marinda Elk, MD;  heparin injection 5,000 Units, 5,000 Units, Subcutaneous, Q8H, Marinda Elk, MD insulin aspart (novoLOG) injection 0-5 Units, 0-5 Units, Subcutaneous, QHS, Marinda Elk, MD;  insulin aspart (novoLOG) injection  0-9 Units, 0-9 Units, Subcutaneous, TID WC, Marinda Elk, MD;  insulin glargine (LANTUS) injection 5 Units, 5 Units, Subcutaneous, QHS, Marinda Elk, MD;  lisinopril (PRINIVIL,ZESTRIL) tablet 40 mg, 40 mg, Oral, Daily, Marinda Elk, MD, 40 mg at 03/23/12 1107 meclizine (ANTIVERT) tablet 25 mg, 25 mg, Oral, BID, Marinda Elk, MD, 25 mg at 03/23/12 1107;  metFORMIN (GLUCOPHAGE) tablet 500 mg, 500 mg, Oral, BID WC, Marinda Elk, MD, 500 mg at 03/23/12 1191;  ofloxacin (OCUFLOX) 0.3 % ophthalmic solution 1 drop, 1 drop, Both Eyes, Daily, Marinda Elk, MD, 1 drop at 03/23/12 1107 ondansetron [facility]) injection 4 mg, 4 mg, Intravenous, Q6H PRN, Marinda Elk, MD, 4 mg at 03/21/12 0756;  ondansetron [facility]) tablet 4 mg, 4 mg, Oral, Q6H PRN, Marinda Elk, MD;  polyethylene glycol (MIRALAX / GLYCOLAX) packet 17 g, 17 g, Oral, Daily PRN, Marinda Elk, MD;  senna-docusate (Senokot-S) tablet 1 tablet, 1 tablet, Oral, QHS PRN, Marinda Elk, MD, 1 tablet at 03/23/12 4782 simvastatin (ZOCOR) tablet 20 mg, 20 mg, Oral, QHS, Marinda Elk, MD, 20 mg at 03/22/12 2144;  DISCONTD: aspirin chewable tablet 81 mg, 81 mg, Oral, Daily, Marinda Elk, MD;  DISCONTD: aspirin suppository 300 mg, 300 mg, Rectal, Daily, Marinda Elk, MD;  DISCONTD: aspirin tablet 325 mg, 325 mg, Oral, Daily, Marinda Elk, MD, 325 mg at 03/22/12 1012  Patients Current Diet: Carb Control  Precautions / Restrictions Precautions Precautions: Fall Restrictions Weight Bearing Restrictions: No   Prior Activity Level Limited Community (1-2x/wk): yes Home Assistive Devices / Equipment Home Assistive Devices/Equipment: None Home Adaptive Equipment: Environmental consultant - standard;Straight cane  Prior Functional Level Prior Function Level of Independence: Independent Able to Take Stairs?: Yes Driving: Yes Vocation: Retired  Current Functional Level Cognition   Arousal/Alertness: Awake/alert Overall Cognitive Status: Appears within functional limits for tasks assessed Overall Cognitive Status: Appears within functional limits for tasks assessed/performed Orientation Level: Oriented X4    Extremity Assessment (includes Sensation/Coordination)  RUE ROM/Strength/Tone: Within functional levels  RLE ROM/Strength/Tone: Deficits RLE ROM/Strength/Tone Deficits: Generalized weakness, requiring phsyical assistance for sit to stand    ADLs  Eating/Feeding: Simulated;Independent Where Assessed - Eating/Feeding: Bed level (or supported sit) Grooming: Simulated;Set up;Supervision/safety Where Assessed - Grooming: Supported sitting Upper Body Bathing: Simulated;Set up;Supervision/safety Where Assessed - Upper Body Bathing: Supported sitting Lower Body Bathing: Moderate assistance;Simulated (due to balance, with total A to stand dynamically) Where Assessed - Lower Body Bathing: Supported sit to stand Upper Body Dressing: Simulated;Maximal assistance (due to balance issues) Where Assessed - Upper Body Dressing: Supported sitting Lower Body Dressing: Performed;Min guard (to donn socks; however balance was all over the place) Where Assessed - Lower Body Dressing: Supported sitting Toilet Transfer: Simulated;Maximal assistance Toilet Transfer Method: Sit to Barista:  (recliner, two steps forward and two steps  back to recliner) Toileting - Clothing Manipulation and Hygiene: Simulated;Minimal assistance (with total A for standing dynamic balance) Where Assessed - Toileting Clothing Manipulation and Hygiene: Standing Equipment Used: Gait belt Transfers/Ambulation Related to ADLs: Max A for transfers ADL Comments: When pt brings her back off of the back of the recliner she immediately starts to lean to the right. Once she was assisted to get static sitting balance with miin A and Mod verbal cues for weight shifts I asked her to take her socks  off; she immediately crossed one leg over the other and lost her balace to the right., continued to remove sock with one hand (when she put it back on with both hands her sitting balance improved with this bil task. Same thing happened with the other leg. Stood patient and more of her weight was on ther RLE. Asked her if her weight was equal on both feet and she said no, asked her  fix it and find her balance and she did with only min guard A; however if I asked her to look in any direction (other than to ther left) she would immediately start falling to the right--when she looke to the left it actually helped her balance.    Mobility  Bed Mobility: Rolling Right;Rolling Left;Right Sidelying to Sit Rolling Right: 5: Supervision;With rail Rolling Left: 5: Supervision;With rail Right Sidelying to Sit: 3: Mod assist;With rails;HOB flat    Transfers  Transfers: Sit to Stand;Stand to Sit Sit to Stand: 3: Mod assist;With upper extremity assist;With armrests;From chair/3-in-1 Stand to Sit: 2: Max assist;With upper extremity assist;With armrests;To chair/3-in-1 Stand to Sit: Patient Percentage: 40%    Ambulation / Gait / Stairs / Wheelchair Mobility  Ambulation/Gait Ambulation/Gait Assistance: 1: +2 Total assist Ambulation/Gait: Patient Percentage: 50% Ambulation Distance (Feet): 20 Feet Assistive device: Rolling walker;2 person hand held assist Ambulation/Gait Assistance Details: attempted use of RW for pt to ? utilize UEs to balance herself, however pt still not able to right herself over her base of support; removed RW after 10 ft and proceeded with bil HHA with increasisng Rt lean as pt progressed/fatigued. Rt LE with near scissoring as pt fatigued Gait Pattern: Step-to pattern;Decreased step length - right;Narrow base of support    Posture / Balance Static Sitting Balance Static Sitting - Balance Support: No upper extremity supported;Feet supported Static Sitting - Level of Assistance: 3: Mod  assist;2: Max assist Static Sitting - Comment/# of Minutes: EOB 5 minutes with pt able to initiate wtshift to midline (from Rt lean), however unable to maintain and immediately shifts back to her Rt Static Standing Balance Static Standing - Balance Support: Bilateral upper extremity supported Static Standing - Level of Assistance:  (t=50%) Static Standing - Comment/# of Minutes: Rt lean with inabilityt to correct without assist     Previous Home Environment Living Arrangements: Alone Lives With: Alone Available Help at Discharge: Family;Available 24 hours/day (not right now, but working on it) Type of Home: House Home Layout: One level Home Access: Stairs to enter Entrance Stairs-Rails: Right Entrance Stairs-Number of Steps: 4 Bathroom Shower/Tub: Engineer, manufacturing systems: Standard Bathroom Accessibility: Yes How Accessible: Accessible via walker Home Care Services: No  Discharge Living Setting Plans for Discharge Living Setting: Patient's home Porfirio Mylar and Tacey Ruiz, daughter and grand daughter to assist) Type of Home at Discharge: House Discharge Home Layout: One level Discharge Home Access: Stairs to enter Entrance Stairs-Rails: Right Entrance Stairs-Number of Steps: 4 Discharge Bathroom Shower/Tub: Tub/shower unit Discharge Bathroom Toilet: Standard Discharge  Bathroom Accessibility: Yes How Accessible: Accessible via walker Do you have any problems obtaining your medications?: No  Social/Family/Support Systems Patient Roles: Parent Contact Information: Phil son and granddaughter, Porfirio Mylar Anticipated Caregiver: Porfirio Mylar and her daughter leah Anticipated Industrial/product designer Information: see above Ability/Limitations of Caregiver: min assist Caregiver Availability: 24/7 Discharge Plan Discussed with Primary Caregiver: Yes Is Caregiver In Agreement with Plan?: Yes Does Caregiver/Family have Issues with Lodging/Transportation while Pt is in Rehab?: No  Goals/Additional  Needs Patient/Family Goal for Rehab: Mod I to supervision PT and OT Expected length of stay: ELOS 2 weeks Special Service Needs: HOH.  Additional Information: Porfirio Mylar is a Charity fundraiser and her daughter , Tacey Ruiz is a CNA going to school to be a PA Pt/Family Agrees to Admission and willing to participate: Yes Program Orientation Provided & Reviewed with Pt/Caregiver Including Roles  & Responsibilities: Yes  Patient Condition: This patient's condition remains as documented in the Consult dated 03/22/12, in which the Rehabilitation Physician determined and documented that the patient's condition is appropriate for intensive rehabilitative care in an inpatient rehabilitation facility.  Preadmission Screen Completed By:  Clois Dupes, 03/23/2012 11:38 AM ______________________________________________________________________   Discussed status with Dr. Riley Kill on 03/23/12 at  1414 and received telephone approval for admission today.  Admission Coordinator:  Clois Dupes, time 8657 Date 03/23/12.

## 2012-03-23 NOTE — H&P (Signed)
Physical Medicine and Rehabilitation Admission H&P    Chief Complaint  Patient presents with  . Near Syncope  : HPI: Lynn Weeks is a 78 y.o. right-handed female with history of type 2 diabetes mellitus, hypertension, coronary artery disease with pacemaker as well his history of inner ear problems. Patient lives alone and was independent prior to admission driving short distances. Admitted 03/20/2012 with near syncope and dizziness. Cranial CT scan with no acute changes. Echocardiogram pending. Cardiac panel was negative. Carotid Dopplers with 40-59% right ICA stenosis.. MRI of the brain not done secondary to pacemaker.  Noted admission white blood cell count of 19,200 as well as review old records showing white blood cell count 19,800 in October of 2012. Chest x-ray was negative for infiltrate and urinalysis study negative with latest white blood cell count 15,500 on 03/20/2012 and monitored. Placed on scheduled Antivert 25 mg twice a day for dizziness. Neurology services consulted suspect possible right cerebellar infarcts not seen on initial cranial CT scan. Placed on Plavix therapy. Patient had been on aspirin 81 mg prior to admission. Subcutaneous heparin added for DVT prophylaxis. Followup cranial CT scan completed 03/23/2012 confirmed acute infarct involving the right cerebellar hemisphere no hemorrhage was seen . Speech therapy followup notes cognition to be within functional limits for tasks assessed. Physical therapy ongoing noting suspect vestibular disorder and await further workup. Recommendations were made for physical medicine rehabilitation consult to consider inpatient rehabilitation services. Patient was felt to be a good candidate for inpatient rehabilitation services and was admitted for comprehensive rehabilitation program  Review of Systems  HENT: Positive for hearing loss.  Eyes:  Blindness right eye  Musculoskeletal: Positive for myalgias.  Neurological: Positive for  dizziness.  All other systems reviewed and are negative   Past Medical History  Diagnosis Date  . Complete heart block 05/08/2011  . CAD (coronary artery disease) 05/08/2011  . Hypertensive heart disease without congestive heart failure 05/08/2011  . Hyponatremia 05/08/2011  . Hyperlipemia 05/08/2011  . Type 2 diabetes 05/08/2011  . Murmur, cardiac 05/08/2011  . Osteoarthritis   . Anxiety    Past Surgical History  Procedure Date  . Tonsillectomy   . Left total knee replacement   . Abdominal hysterectomy   . Cataract extraction, bilateral   . Right shoulder hemiarthroplasty 2005    [name]  . Pacemaker insertion    Family History  Problem Relation Age of Onset  . Stroke Mother    Social History:  reports that she has never smoked. She has never used smokeless tobacco. She reports that she does not drink alcohol or use illicit drugs. Allergies:  Allergies  Allergen Reactions  . Ciprofloxacin Nausea And Vomiting  . Crestor (Rosuvastatin Calcium) Swelling  . Escitalopram Oxalate Other (See Comments)    Yawning and insomnia   . Lipitor (Atorvastatin Calcium) Other (See Comments)    myalgia  . Metronidazole Nausea And Vomiting  . Sulfa Antibiotics Swelling   Medications Prior to Admission  Medication Sig Dispense Refill  . amLODipine (NORVASC) 5 MG tablet Take 5 mg by mouth daily.        . aspirin 81 MG tablet Take 81 mg by mouth daily.        . bisacodyl (DULCOLAX) 5 MG EC tablet Take 10 mg by mouth daily as needed. For constipation      . carvedilol (COREG CR) 40 MG 24 hr capsule Take 40 mg by mouth daily.        . furosemide (LASIX)   20 MG tablet Take 10-20 mg by mouth 2 (two) times daily as needed. For swelling      . lisinopril (PRINIVIL,ZESTRIL) 40 MG tablet Take 40 mg by mouth daily.        . metFORMIN (GLUCOPHAGE) 500 MG tablet Take 500 mg by mouth 2 (two) times daily with a meal.       . ofloxacin (OCUFLOX) 0.3 % ophthalmic solution Place 1 drop into both eyes  daily.       . simvastatin (ZOCOR) 20 MG tablet Take 20 mg by mouth at bedtime.          Home: Home Living Lives With: Alone Available Help at Discharge: Family;Available 24 hours/day (not right now, but working on it) Type of Home: House Home Access: Stairs to enter Entrance Stairs-Number of Steps: 4 Entrance Stairs-Rails: Right Home Layout: One level Bathroom Shower/Tub: Tub/shower unit Bathroom Toilet: Standard Home Adaptive Equipment: Walker - standard;Straight cane   Functional History: Prior Function Able to Take Stairs?: Yes Vocation: Retired  Functional Status:  Mobility: Bed Mobility Bed Mobility: Rolling Right;Rolling Left;Right Sidelying to Sit Rolling Right: 5: Supervision;With rail Rolling Left: 5: Supervision;With rail Right Sidelying to Sit: 3: Mod assist;With rails;HOB flat Transfers Transfers: Sit to Stand;Stand to Sit Sit to Stand: 3: Mod assist;With upper extremity assist;With armrests;From chair/3-in-1 Stand to Sit: 2: Max assist;With upper extremity assist;With armrests;To chair/3-in-1 Stand to Sit: Patient Percentage: 40% Ambulation/Gait Ambulation/Gait Assistance: 1: +2 Total assist Ambulation/Gait: Patient Percentage: 50% Ambulation Distance (Feet): 20 Feet Assistive device: Rolling walker;2 person hand held assist Ambulation/Gait Assistance Details: attempted use of RW for pt to ? utilize UEs to balance herself, however pt still not able to right herself over her base of support; removed RW after 10 ft and proceeded with bil HHA with increasisng Rt lean as pt progressed/fatigued. Rt LE with near scissoring as pt fatigued Gait Pattern: Step-to pattern;Decreased step length - right;Narrow base of support    ADL: ADL Eating/Feeding: Simulated;Independent Where Assessed - Eating/Feeding: Bed level (or supported sit) Grooming: Simulated;Set up;Supervision/safety Where Assessed - Grooming: Supported sitting Upper Body Bathing: Simulated;Set  up;Supervision/safety Where Assessed - Upper Body Bathing: Supported sitting Lower Body Bathing: Moderate assistance;Simulated (due to balance, with total A to stand dynamically) Where Assessed - Lower Body Bathing: Supported sit to stand Upper Body Dressing: Simulated;Maximal assistance (due to balance issues) Where Assessed - Upper Body Dressing: Supported sitting Lower Body Dressing: Performed;Min guard (to donn socks; however balance was all over the place) Where Assessed - Lower Body Dressing: Supported sitting Toilet Transfer: Simulated;Maximal assistance Toilet Transfer Method: Sit to stand Toilet Transfer Equipment:  (recliner, two steps forward and two steps back to recliner) Equipment Used: Gait belt Transfers/Ambulation Related to ADLs: Max A for transfers ADL Comments: When pt brings her back off of the back of the recliner she immediately starts to lean to the right. Once she was assisted to get static sitting balance with miin A and Mod verbal cues for weight shifts I asked her to take her socks off; she immediately crossed one leg over the other and lost her balace to the right., continued to remove sock with one hand (when she put it back on with both hands her sitting balance improved with this bil task. Same thing happened with the other leg. Stood patient and more of her weight was on ther RLE. Asked her if her weight was equal on both feet and she said no, asked her  fix it and find her   balance and she did with only min guard A; however if I asked her to look in any direction (other than to ther left) she would immediately start falling to the right--when she looke to the left it actually helped her balance.  Cognition: Cognition Overall Cognitive Status: Appears within functional limits for tasks assessed Arousal/Alertness: Awake/alert Orientation Level: Oriented X4 Cognition Overall Cognitive Status: Appears within functional limits for tasks  assessed/performed Arousal/Alertness: Awake/alert Orientation Level: Appears intact for tasks assessed Behavior During Session: WFL for tasks performed   Blood pressure 131/73, pulse 92, temperature 99.2 F (37.3 C), temperature source Oral, resp. rate 18, height 4' 6" (1.372 m), weight 46.448 kg (102 lb 6.4 oz), SpO2 92.00%. Physical Exam  Vitals reviewed.  Constitutional: She is oriented to person, place, and time. She appears well-developed.  HENT: oral mucosa pink, dentition borderline Head: Normocephalic.  Eyes:  Blindness right eye  Neck: Neck supple. No thyromegaly present.  Cardiovascular:  Cardiac rate controlled, no murmur or gallops Pulmonary/Chest: Breath sounds normal. She has no wheezes or rhonchi Abdominal: She exhibits no distension. There is no tenderness. There is no rebound.  Musculoskeletal: She exhibits no edema.  Neurological: She is alert and oriented to person, place, and time.  Patient follows full commands. She is hard of hearing on the left. Speech is clear. Cognitively she's intact. Blind in right eye. 5 beats nystagmus with right lateral gaze and 2 beats to the left. mild limb ataxia on the right. Sensation and strength appear to be within normal limits  Skin: Skin is warm and dry.  Psychiatric: She has a normal mood and affect   Results for orders placed during the hospital encounter of 03/20/12 (from the past 48 hour(s))  CARDIAC PANEL(CRET KIN+CKTOT+MB+TROPI)     Status: Normal   Collection Time   03/21/12 10:35 AM      Component Value Range Comment   Total CK 43  7 - 177 U/L    CK, MB 2.2  0.3 - 4.0 ng/mL    Troponin I <0.30  <0.30 ng/mL    Relative Index RELATIVE INDEX IS INVALID  0.0 - 2.5   GLUCOSE, CAPILLARY     Status: Abnormal   Collection Time   03/21/12 11:30 AM      Component Value Range Comment   Glucose-Capillary 144 (*) 70 - 99 mg/dL   GLUCOSE, CAPILLARY     Status: Abnormal   Collection Time   03/21/12  4:12 PM      Component  Value Range Comment   Glucose-Capillary 298 (*) 70 - 99 mg/dL    Comment 1 Documented in Chart      Comment 2 Notify RN     GLUCOSE, CAPILLARY     Status: Abnormal   Collection Time   03/21/12  9:16 PM      Component Value Range Comment   Glucose-Capillary 144 (*) 70 - 99 mg/dL    Comment 1 Notify RN     GLUCOSE, CAPILLARY     Status: Abnormal   Collection Time   03/22/12 11:51 AM      Component Value Range Comment   Glucose-Capillary 149 (*) 70 - 99 mg/dL   VITAMIN B12     Status: Normal   Collection Time   03/22/12 12:27 PM      Component Value Range Comment   Vitamin B-12 854  211 - 911 pg/mL   TSH     Status: Normal   Collection Time   03/22/12   12:27 PM      Component Value Range Comment   TSH 1.257  0.350 - 4.500 uIU/mL   GLUCOSE, CAPILLARY     Status: Abnormal   Collection Time   03/22/12  4:39 PM      Component Value Range Comment   Glucose-Capillary 213 (*) 70 - 99 mg/dL    Comment 1 Notify RN     GLUCOSE, CAPILLARY     Status: Abnormal   Collection Time   03/22/12  8:45 PM      Component Value Range Comment   Glucose-Capillary 142 (*) 70 - 99 mg/dL    Comment 1 Notify RN      Comment 2 Documented in Chart     GLUCOSE, CAPILLARY     Status: Abnormal   Collection Time   03/23/12  6:23 AM      Component Value Range Comment   Glucose-Capillary 111 (*) 70 - 99 mg/dL    Comment 1 Documented in Chart      Comment 2 Notify RN      Dg Chest 2 View  03/21/2012  *RADIOLOGY REPORT*  Clinical Data: Dizziness and shortness of breath.  CHEST - 2 VIEW  Comparison: 03/20/2012.  Findings: The cardiac silhouette, mediastinal and hilar contours are stable.  The pacer wires are stable.  Suspect interstitial pulmonary edema with Kerley B lines.  Persistent areas of atelectasis.  No definite pleural effusion.  IMPRESSION: CHF with interstitial edema and persistent areas of atelectasis.   Original Report Authenticated By: P. MARK GALLERANI, M.D.     Post Admission Physician  Evaluation: 1. Functional deficits secondary  to thrombotic right cerebellar infarct. 2. Patient is admitted to receive collaborative, interdisciplinary care between the physiatrist, rehab nursing staff, and therapy team. 3. Patient's level of medical complexity and substantial therapy needs in context of that medical necessity cannot be provided at a lesser intensity of care such as a SNF. 4. Patient has experienced substantial functional loss from his/her baseline which was documented above under the "Functional History" and "Functional Status" headings.  Judging by the patient's diagnosis, physical exam, and functional history, the patient has potential for functional progress which will result in measurable gains while on inpatient rehab.  These gains will be of substantial and practical use upon discharge  in facilitating mobility and self-care at the household level. 5. Physiatrist will provide 24 hour management of medical needs as well as oversight of the therapy plan/treatment and provide guidance as appropriate regarding the interaction of the two. 6. 24 hour rehab nursing will assist with bladder management, bowel management, safety, skin/wound care, disease management, medication administration, pain management and patient education  and help integrate therapy concepts, techniques,education, etc. 7. PT will assess and treat for:  fxnl mobiltiy, NMR, balance and coordination, safety.  Goals are: mod I to supervision. 8. OT will assess and treat for: ADL's, NMR, balancd and coordination, fxnl mobility, safety.   Goals are: mod I to minimal assist. 9. SLP will assess and treat for: n/a  10. Case Management and Social Worker will assess and treat for psychological issues and discharge planning. 11. Team conference will be held weekly to assess progress toward goals and to determine barriers to discharge. 12. Patient will receive at least 3 hours of therapy per day at least 5 days per  week. 13. ELOS: 2+ weeks      Prognosis:  good   Medical Problem List and Plan: 1. 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 DVT Prophylaxis/Anticoagulation: Subcutaneous heparin. Monitor platelet counts any signs of bleeding 3. Diabetes mellitus. Hemoglobin A1c 6.5. Glucophage 500 mg twice a day. Check blood sugars a.c. and at bedtime 4. Neuropsych: This patient is capable of making decisions on his/her own behalf. 5. Hypertension. Coreg 40 mg daily, lisinopril 40 mg daily. Monitor with increased activity. Secondary prevention education 6. Hyperlipidemia. Zocor 7. Blindness right eye 8. Leukocytosis. White blood cell count 15,500 03/21/2012. Urinalysis and chest x-ray negative. Patient is afebrile. Will followup CBC 03/23/2012, Zach Temitope Griffing, MD   

## 2012-03-23 NOTE — Progress Notes (Signed)
[facility] PROGRESS NOTE  Lynn Weeks WJX:[id number] DOB: 25-Feb-1919 DOA: 03/20/2012   Assessment/Plan: Patient Active Hospital Problem List: Near syncope (03/20/2012) -She continues to have dizziness, continue to fall to the right side. 03/22/2012 started on meclizine.  -Physical therapy recommended CIR, evaluated by CIR on 03/22/2012. On 03/22/2012  vitamin B12, RBC folate and TSH were normal. -03/21/2012 carotid Doppler was negative for any ICA stenosis.  -Neurology consult, recommended CT of head on 03/22/2012.  Hyperlipemia (05/08/2011) -continue statins.  Type 2 diabetes (05/08/2011) -blood glucose stable.  -Continue current treatment.  Acute on Chronic kidney disease (CKD), stage III (moderate) (05/08/2011) -now resolved, this is most likely secondary to decreased intravascular volume .  Leukocytosis (03/20/2012) -afebrile -CXR on 03/22/2012 showed mild interticisial edema.  Code Status: Full  Family Communication: Patient  Disposition Plan: To be determined after physical therapy evaluation   LOS: 3 days   Procedures:  Chest x-ray mild pulmonary edema   Subjective: Patient relates her dizziness is much better. But continues to fall to the right side.  Objective: Filed Vitals:   03/22/12 1411 03/22/12 2100 03/23/12 0622 03/23/12 1106  BP: 111/48 132/52 131/73 124/71  Pulse: 63 64 92 69  Temp: 97.5 F (36.4 C) 98.4 F (36.9 C) 99.2 F (37.3 C)   TempSrc: Oral Oral Oral   Resp: 20 18 18    Height:      Weight:   46.448 kg (102 lb 6.4 oz)   SpO2: 93% 96% 92%     Intake/Output Summary (Last 24 hours) at 03/23/12 1311 Last data filed at 03/23/12 1234  Gross per 24 hour  Intake    960 ml  Output   2350 ml  Net  -1390 ml   Weight change: 0.635 kg (1 lb 6.4 oz)  Exam:  General: Alert, awake, oriented x3, in no acute distress.  Heart: Regular rate and rhythm, without murmurs, rubs, gallops.  Lungs: Good air movement, clear to  auscultation. Abdomen: Soft, nontender, nondistended, positive bowel sounds.  Neuro: Finger to nose continues to be normal, muscle strength is 5/5 on all 4 extremity. Patient continue to fall to the right side.   Data Reviewed: Basic Metabolic Panel:  Lab 03/21/12 2130 03/20/12 1811 03/20/12 1252  NA 135 -- 136  K 4.0 -- 4.1  CL 100 -- 101  CO2 22 -- 24  GLUCOSE 153* -- 239*  BUN 18 -- 23  CREATININE 0.77 0.80 0.92  CALCIUM 8.7 -- 9.2  MG -- -- --  PHOS -- -- --   Liver Function Tests:  Lab 03/21/12 0126  AST 22  ALT 10  ALKPHOS 74  BILITOT 0.4  PROT 6.6  ALBUMIN 3.4*   No results found for this basename: LIPASE:5,AMYLASE:5 in the last 168 hours No results found for this basename: AMMONIA:5 in the last 168 hours CBC:  Lab 03/21/12 0126 03/20/12 1811 03/20/12 1252  WBC 15.5* 17.4* 19.2*  NEUTROABS -- -- 17.2*  HGB 13.8 14.8 14.1  HCT 40.2 44.0 41.8  MCV 90.7 90.9 91.5  PLT 382 381 362   Cardiac Enzymes:  Lab 03/21/12 1035 03/21/12 0127 03/20/12 1820  CKTOTAL 43 40 44  CKMB 2.2 2.0 2.3  CKMBINDEX -- -- --  TROPONINI <0.30 <0.30 <0.30   BNP: No components found with this basename: POCBNP:5 CBG:  Lab 03/23/12 1124 03/23/12 0623 03/22/12 2045 03/22/12 1639 03/22/12 1151  GLUCAP 160* 111* 142* 213* 149*    No results found for this or any previous visit (  from the past 240 hour(s)).   Studies: Ct Head Wo Contrast  03/20/2012  *RADIOLOGY REPORT*  Clinical Data: Dizzy.  Fell while gardening.  CT HEAD WITHOUT CONTRAST  Technique:  Contiguous axial images were obtained from the base of the skull through the vertex without contrast.  Comparison: None.  Findings: There is no evidence for acute infarction, intracranial hemorrhage, mass lesion, hydrocephalus, or extra-axial fluid. There is mild atrophy with chronic microvascular ischemic change. There is remote right caudate lacuna.  Calvarium is intact.  There is advanced vascular calcification.  No acute sinus or  mastoid fluid.  IMPRESSION: Chronic changes as described.  No skull fracture or intracranial hemorrhage.  No visible acute stroke or bleed.   Original Report Authenticated By: Elsie Stain, M.D.    Dg Chest Port 1 View  03/20/2012  *RADIOLOGY REPORT*  Clinical Data: Dizziness, weakness, possible pneumonia  PORTABLE CHEST - 1 VIEW  Comparison: 05/10/2011  Findings: Low lung volumes with vascular crowding.  Increased interstitial markings, likely chronic.  Mild patchy bilateral lower lobe opacities, favored to reflect atelectasis. Medial right lower lobe pneumonia is not entirely excluded.  No pleural effusion or pneumothorax.  Mild cardiomegaly.  Left subclavian pacemaker.  Right shoulder arthroplasty.  IMPRESSION: Mild patchy bilateral lower lobe opacities, favored to reflect atelectasis.  Medial right lower lobe pneumonia is not entirely excluded but is considered less likely.  Mild cardiomegaly.  These results were called by telephone on 03/20/2012 at 1555 hrs to Dr Pricilla Loveless, who verbally acknowledged these results.   Original Report Authenticated By: Charline Bills, M.D.     Scheduled Meds:    . carvedilol  40 mg Oral Daily  . clopidogrel  75 mg Oral Q breakfast  . heparin  5,000 Units Subcutaneous Q8H  . insulin aspart  0-5 Units Subcutaneous QHS  . insulin aspart  0-9 Units Subcutaneous TID WC  . insulin glargine  5 Units Subcutaneous QHS  . lisinopril  40 mg Oral Daily  . meclizine  25 mg Oral BID  . metFORMIN  500 mg Oral BID WC  . ofloxacin  1 drop Both Eyes Daily  . simvastatin  20 mg Oral QHS  . DISCONTD: aspirin  81 mg Oral Daily   Continuous Infusions:    . sodium chloride 125 mL/hr at 03/20/12 1259    Lambert Keto, MD  [facility] Pager [phone]  If 7PM-7AM, please contact night-coverage [email or web address] Password [email or web address] 03/23/2012, 1:11 PM

## 2012-03-23 NOTE — Discharge Summary (Signed)
Physician Discharge Summary  Lynn Weeks WUJ:[id number] DOB: Jul 24, 1919 DOA: 03/20/2012  PCP: Ailene Ravel, MD  Admit date: 03/20/2012 Discharge date: 03/23/2012  Discharge Diagnoses:  Principal Problem:  *CVA (cerebral infarction) Active Problems:  Hyperlipemia  Type 2 diabetes  Chronic kidney disease (CKD), stage III (moderate)  Near syncope  Leukocytosis   Discharge Condition: stable  Disposition:   Diet:regular Wt Readings from Last 3 Encounters:  03/23/12 46.448 kg (102 lb 6.4 oz)    History of present illness:  78 year old female with past medical history of diabetes type 2, hypertension, 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 She said she went outside to sweep her porch when she fell to the floor and landedon her right side. She relates she was there for about an hour, every time she tried to get up she felt dizzy and fell. She relates she doesn't think she lost consciousness. She is dizzy all the time and worsen with trying to get up. She relates no asymmetrical weakness. She relates no chest pain or shortness of breath. She does relate she has had a new cough for the past 2 days, but no fevers. She relates she has to crawl in the house as she couldn't get up to call 911.   Hospital Course:  Near syncope (03/20/2012)/Vertigo -She was admitted to telemetry with no events, meclizine started physical therapy was consulted initial CT scan showed no acute intracranial abnormalities. Neurology was consulted recommended a repeat CT scan that showed a right cerebellar stroke. Vitamin B12 RBC folate and TSH were within normal limits.  -Physical therapy recommended CIR, evaluated by CIR on 03/22/2012. -03/21/2012 carotid Doppler was negative for any ICA stenosis.  -She was changed from aspirin to Plavix daily.   Hyperlipemia (05/08/2011) -continue statins.   Type 2 diabetes (05/08/2011) -blood glucose stable.  -Continue current home treatment.   Acute on  Chronic kidney disease (CKD), stage III (moderate) (05/08/2011) -now resolved, this is most likely secondary to decreased intravascular volume .   Leukocytosis (03/20/2012) -afebrile  -CXR on 03/22/2012 showed mild interticisial edema   Discharge Exam: Filed Vitals:   03/23/12 1106  BP: 124/71  Pulse: 69  Temp:   Resp:    Filed Vitals:   03/22/12 1411 03/22/12 2100 03/23/12 0622 03/23/12 1106  BP: 111/48 132/52 131/73 124/71  Pulse: 63 64 92 69  Temp: 97.5 F (36.4 C) 98.4 F (36.9 C) 99.2 F (37.3 C)   TempSrc: Oral Oral Oral   Resp: 20 18 18    Height:      Weight:   46.448 kg (102 lb 6.4 oz)   SpO2: 93% 96% 92%    See progress note  Discharge Instructions  Discharge Orders    Future Appointments: Provider: Department: Dept Phone: Center:   03/24/2012 8:00 AM Leone Brand, PT [facility] [phone] None   03/24/2012 9:00 AM Clois Dupes, LRT/CTRS [facility] [phone] None   03/24/2012 10:00 AM Stark Bray Saguier, OT [facility] [phone] None   03/24/2012 11:30 AM Leone Brand, PT [facility] [phone] None   03/24/2012 1:30 PM Agustin Cree, OT [facility] [phone] None     Medication List  As of 03/23/2012  2:05 PM   STOP taking these medications         aspirin 81 MG tablet         TAKE these medications         amLODipine 5  MG tablet   Commonly known as: NORVASC   Take 5 mg by mouth daily.      bisacodyl 5 MG EC tablet   Commonly known as: DULCOLAX   Take 10 mg by mouth daily as needed. For constipation      carvedilol 40 MG 24 hr capsule   Commonly known as: COREG CR   Take 40 mg by mouth daily.      furosemide 20 MG tablet   Commonly known as: LASIX   Take 10-20 mg by mouth 2 (two) times daily as needed. For swelling      lisinopril 40 MG tablet   Commonly known as: PRINIVIL,ZESTRIL   Take 40 mg by mouth daily.      metFORMIN 500 MG tablet   Commonly known as: GLUCOPHAGE   Take 500 mg  by mouth 2 (two) times daily with a meal.      ofloxacin 0.3 % ophthalmic solution   Commonly known as: OCUFLOX   Place 1 drop into both eyes daily.      simvastatin 20 MG tablet   Commonly known as: ZOCOR   Take 20 mg by mouth at bedtime.              The results of significant diagnostics from this hospitalization (including imaging, microbiology, ancillary and laboratory) are listed below for reference.    Significant Diagnostic Studies: Dg Chest 2 View  03/21/2012  *RADIOLOGY REPORT*  Clinical Data: Dizziness and shortness of breath.  CHEST - 2 VIEW  Comparison: 03/20/2012.  Findings: The cardiac silhouette, mediastinal and hilar contours are stable.  The pacer wires are stable.  Suspect interstitial pulmonary edema with Kerley B lines.  Persistent areas of atelectasis.  No definite pleural effusion.  IMPRESSION: CHF with interstitial edema and persistent areas of atelectasis.   Original Report Authenticated By: P. Loralie Champagne, M.D.    Ct Head Wo Contrast  03/23/2012  *RADIOLOGY REPORT*  Clinical Data: Continued dizziness and recent fall, left-sided headache  CT HEAD WITHOUT CONTRAST  Technique:  Contiguous axial images were obtained from the base of the skull through the vertex without contrast.  Comparison: 03/20/2012.  Findings: Mild atrophic changes and chronic white matter ischemic change is again identified. There is a new geographic area of decreased attenuation within the right server about the severe which measures approximate 3.6 x 3.1 cm.  This is consistent with an acute infarct.  No other areas of acute ischemia are noted. Some mild mass effect upon the fourth ventricle is seen.  IMPRESSION: Acute infarct involving the right cerebellar hemisphere.  No hemorrhage is seen.  These changes are new from the previous exam.   Original Report Authenticated By: Phillips Odor, M.D.    Ct Head Wo Contrast  03/20/2012  *RADIOLOGY REPORT*  Clinical Data: Dizzy.  Fell while  gardening.  CT HEAD WITHOUT CONTRAST  Technique:  Contiguous axial images were obtained from the base of the skull through the vertex without contrast.  Comparison: None.  Findings: There is no evidence for acute infarction, intracranial hemorrhage, mass lesion, hydrocephalus, or extra-axial fluid. There is mild atrophy with chronic microvascular ischemic change. There is remote right caudate lacuna.  Calvarium is intact.  There is advanced vascular calcification.  No acute sinus or mastoid fluid.  IMPRESSION: Chronic changes as described.  No skull fracture or intracranial hemorrhage.  No visible acute stroke or bleed.   Original Report Authenticated By: Elsie Stain, M.D.    Dg Chest  Port 1 View  03/20/2012  *RADIOLOGY REPORT*  Clinical Data: Dizziness, weakness, possible pneumonia  PORTABLE CHEST - 1 VIEW  Comparison: 05/10/2011  Findings: Low lung volumes with vascular crowding.  Increased interstitial markings, likely chronic.  Mild patchy bilateral lower lobe opacities, favored to reflect atelectasis. Medial right lower lobe pneumonia is not entirely excluded.  No pleural effusion or pneumothorax.  Mild cardiomegaly.  Left subclavian pacemaker.  Right shoulder arthroplasty.  IMPRESSION: Mild patchy bilateral lower lobe opacities, favored to reflect atelectasis.  Medial right lower lobe pneumonia is not entirely excluded but is considered less likely.  Mild cardiomegaly.  These results were called by telephone on 03/20/2012 at 1555 hrs to Dr Pricilla Loveless, who verbally acknowledged these results.   Original Report Authenticated By: Charline Bills, M.D.     Microbiology: No results found for this or any previous visit (from the past 240 hour(s)).   Labs: Basic Metabolic Panel:  Lab 03/21/12 9604 03/20/12 1811 03/20/12 1252  NA 135 -- 136  K 4.0 -- 4.1  CL 100 -- 101  CO2 22 -- 24  GLUCOSE 153* -- 239*  BUN 18 -- 23  CREATININE 0.77 0.80 0.92  CALCIUM 8.7 -- 9.2  MG -- -- --  PHOS --  -- --   Liver Function Tests:  Lab 03/21/12 0126  AST 22  ALT 10  ALKPHOS 74  BILITOT 0.4  PROT 6.6  ALBUMIN 3.4*   No results found for this basename: LIPASE:5,AMYLASE:5 in the last 168 hours No results found for this basename: AMMONIA:5 in the last 168 hours CBC:  Lab 03/21/12 0126 03/20/12 1811 03/20/12 1252  WBC 15.5* 17.4* 19.2*  NEUTROABS -- -- 17.2*  HGB 13.8 14.8 14.1  HCT 40.2 44.0 41.8  MCV 90.7 90.9 91.5  PLT 382 381 362   Cardiac Enzymes:  Lab 03/21/12 1035 03/21/12 0127 03/20/12 1820  CKTOTAL 43 40 44  CKMB 2.2 2.0 2.3  CKMBINDEX -- -- --  TROPONINI <0.30 <0.30 <0.30   BNP: No components found with this basename: POCBNP:5 CBG:  Lab 03/23/12 1124 03/23/12 0623 03/22/12 2045 03/22/12 1639 03/22/12 1151  GLUCAP 160* 111* 142* 213* 149*    Time coordinating discharge: greater than 40 minutes  Signed:  Marinda Elk  [facility] 03/23/2012, 2:05 PM

## 2012-03-23 NOTE — Progress Notes (Signed)
I met with patient and her son at bedside. Patient will benefit from an inpt rehab admission and I can admit today. I contacted Dr. David Stall and he is aware and to assess pt this morning for ability to d/c to CIR today. [phone]

## 2012-03-23 NOTE — Progress Notes (Signed)
I cosign for Lynn Weeks's RN assessment and medication administration. Lynn Weeks 

## 2012-03-24 ENCOUNTER — Inpatient Hospital Stay (HOSPITAL_COMMUNITY): Payer: Medicare Other | Admitting: Physical Therapy

## 2012-03-24 ENCOUNTER — Inpatient Hospital Stay (HOSPITAL_COMMUNITY): Payer: Medicare Other | Admitting: Occupational Therapy

## 2012-03-24 ENCOUNTER — Inpatient Hospital Stay (HOSPITAL_COMMUNITY): Payer: Medicare Other | Admitting: *Deleted

## 2012-03-24 ENCOUNTER — Encounter (HOSPITAL_COMMUNITY): Payer: Self-pay | Admitting: Rehabilitation

## 2012-03-24 DIAGNOSIS — I639 Cerebral infarction, unspecified: Secondary | ICD-10-CM

## 2012-03-24 DIAGNOSIS — I6330 Cerebral infarction due to thrombosis of unspecified cerebral artery: Secondary | ICD-10-CM

## 2012-03-24 DIAGNOSIS — Z5189 Encounter for other specified aftercare: Secondary | ICD-10-CM

## 2012-03-24 DIAGNOSIS — I6789 Other cerebrovascular disease: Secondary | ICD-10-CM

## 2012-03-24 DIAGNOSIS — I69993 Ataxia following unspecified cerebrovascular disease: Secondary | ICD-10-CM

## 2012-03-24 LAB — CBC WITH DIFFERENTIAL/PLATELET
Basophils Relative: 1 % (ref 0–1)
Eosinophils Relative: 2 % (ref 0–5)
HCT: 40.6 % (ref 36.0–46.0)
Hemoglobin: 14 g/dL (ref 12.0–15.0)
Lymphocytes Relative: 17 % (ref 12–46)
Lymphs Abs: 2 10*3/uL (ref 0.7–4.0)
MCV: 90 fL (ref 78.0–100.0)
Monocytes Relative: 6 % (ref 3–12)
Neutro Abs: 8.8 10*3/uL — ABNORMAL HIGH (ref 1.7–7.7)
WBC: 11.8 10*3/uL — ABNORMAL HIGH (ref 4.0–10.5)

## 2012-03-24 LAB — COMPREHENSIVE METABOLIC PANEL
Albumin: 3 g/dL — ABNORMAL LOW (ref 3.5–5.2)
Alkaline Phosphatase: 61 U/L (ref 39–117)
BUN: 19 mg/dL (ref 6–23)
CO2: 25 mEq/L (ref 19–32)
Chloride: 100 mEq/L (ref 96–112)
GFR calc non Af Amer: 52 mL/min — ABNORMAL LOW (ref >90)
Glucose, Bld: 117 mg/dL — ABNORMAL HIGH (ref 70–99)
Potassium: 3.7 mEq/L (ref 3.5–5.1)
Total Bilirubin: 0.6 mg/dL (ref 0.3–1.2)

## 2012-03-24 LAB — GLUCOSE, CAPILLARY: Glucose-Capillary: 122 mg/dL — ABNORMAL HIGH (ref 70–99)

## 2012-03-24 MED ORDER — CARVEDILOL PHOSPHATE ER 40 MG PO CP24
40.0000 mg | ORAL_CAPSULE | Freq: Every day | ORAL | Status: DC
  Administered 2012-03-25 – 2012-04-05 (×12): 40 mg via ORAL
  Filled 2012-03-24 (×13): qty 1

## 2012-03-24 NOTE — Care Management Note (Signed)
Inpatient Rehabilitation Center Individual Statement of Services  Patient Name:  Lynn Weeks  Date:  03/24/2012  Welcome to the Inpatient Rehabilitation Center.  Our goal is to provide you with an individualized program based on your diagnosis and situation, designed to meet your specific needs.  With this comprehensive rehabilitation program, you will be expected to participate in at least 3 hours of rehabilitation therapies Monday-Friday, with modified therapy programming on the weekends.  Your rehabilitation program will include the following services:  Physical Therapy (PT), Occupational Therapy (OT), Speech Therapy (ST), 24 hour per day rehabilitation nursing, Therapeutic Recreaction (TR), Neuropsychology, Case Management (RN and Social Worker), Rehabilitation Medicine, Nutrition Services and Pharmacy Services  Weekly team conferences will be held on Wednesday to discuss your progress.  Your RN Case Designer, television/film set will talk with you frequently to get your input and to update you on team discussions.  Team conferences with you and your family in attendance may also be held.  Expected length of stay: 2 weeks Overall anticipated outcome: Supervision level  Depending on your progress and recovery, your program may change.  Your RN Case Estate agent will coordinate services and will keep you informed of any changes.  Your RN Sports coach and SW names and contact numbers are listed  below.  The following services may also be recommended but are not provided by the Inpatient Rehabilitation Center:   Driving Evaluations  Home Health Rehabiltiation Services  Outpatient Rehabilitatation [facility]  Vocational Rehabilitation   Arrangements will be made to provide these services after discharge if needed.  Arrangements include referral to agencies that provide these services.  Your insurance has been verified to be:  Medicare & Generic Commerical Your primary doctor  is:  Dr. Burnell Blanks  Pertinent information will be shared with your doctor and your insurance company.   Social Worker:  Dossie Der, [place] [phone]  Information discussed with and copy given to patient by: Lucy Chris, 03/24/2012, 9:46 AM

## 2012-03-24 NOTE — Interval H&P Note (Signed)
[place] L Nuccio was admitted today to Inpatient Rehabilitation with the diagnosis of thrombotic right cerebellar infarct.  The patient's history has been reviewed, patient examined, and there is no change in status. This H&P is an update to my full H&P performed yesterday for the purpose of placing the document in the rehab chart.  Patient continues to be appropriate for intensive inpatient rehabilitation.  I have reviewed the patient's chart and labs.  Questions were answered to the patient's satisfaction.  Aracelis Ulrey T 03/24/2012, 6:40 AM

## 2012-03-24 NOTE — Progress Notes (Signed)
Patient information reviewed and entered into [facility] system by Jayziah Bankhead, RN, CRRN, PPS Coordinator.  Information including medical coding and functional independence measure will be reviewed and updated through discharge.     Per nursing patient was given "Data Collection Information Summary for Patients in Inpatient Rehabilitation Facilities with attached "Privacy Act Statement-Health Care Records" upon admission.   

## 2012-03-24 NOTE — Evaluation (Signed)
Occupational Therapy Assessment and Plan and Skilled Therapy Intervention  Patient Details  Name: Lynn Weeks MRN: [id number] Date of Birth: [birth year]  OT Diagnosis: abnormal posture, blindness and low vision, disturbance of vision and muscle weakness (generalized) Rehab Potential: Rehab Potential: Good ELOS: 2 weeks   Today's Date: 03/24/2012 Time: 1000-1100 Time Calculation (min): 60 min  1:1 Pt seen for initial evaluation and BADL retraining to include adl transfers, toileting, and UB dressing.  Pt was feeling very dizzy.  She stated that she felt that it was worse than earlier in the am.  Therefore, bathing was not initiated and pt already had on LB clothing.  After self care, pt worked from bed level on bridging and abdominal strengthening to assist with postural control.  Pt will need a vestibular evaluation, but we did initiate basic eye stabilization exercises.  Problem List:  Patient Active Problem List  Diagnosis  . CAD (coronary artery disease)  . Complete heart block  . Hypertensive heart disease without congestive heart failure  . Hyperlipemia  . Type 2 diabetes  . Chronic kidney disease (CKD), stage III (moderate)  . Hyponatremia  . Murmur, cardiac  . Near syncope  . Leukocytosis  . CVA (cerebral infarction)  . Stroke    Past Medical History:  Past Medical History  Diagnosis Date  . Complete heart block 05/08/2011  . CAD (coronary artery disease) 05/08/2011  . Hypertensive heart disease without congestive heart failure 05/08/2011  . Hyponatremia 05/08/2011  . Hyperlipemia 05/08/2011  . Type 2 diabetes 05/08/2011  . Murmur, cardiac 05/08/2011  . Osteoarthritis   . Anxiety    Past Surgical History:  Past Surgical History  Procedure Date  . Tonsillectomy   . Left total knee replacement   . Abdominal hysterectomy   . Cataract extraction, bilateral   . Right shoulder hemiarthroplasty 2005    Lynn Weeks  . Pacemaker insertion     Assessment &  Plan Clinical Impression: Lynn Weeks is a 78 y.o. right-handed female with history of type 2 diabetes mellitus, hypertension, coronary artery disease with pacemaker as well his history of inner ear problems. Patient lives alone and was independent prior to admission driving short distances. Admitted 03/20/2012 with near syncope and dizziness. Cranial CT scan with no acute changes. Echocardiogram pending. Cardiac panel was negative. Carotid Dopplers with 40-59% right ICA stenosis. MRI of the brain not done secondary to pacemaker.  Noted admission white blood cell count of 19,200 as well as review old records showing white blood cell count 19,800 in October of 2012. Chest x-ray was negative for infiltrate and urinalysis study negative with latest white blood cell count 15,500 on 03/20/2012 and monitored. Placed on scheduled Antivert 25 mg twice a day for dizziness. Neurology services consulted suspect possible right cerebellar infarcts not seen on initial cranial CT scan. Placed on Plavix therapy. Patient had been on aspirin 81 mg prior to admission. Subcutaneous heparin added for DVT prophylaxis. Followup cranial CT scan completed 03/23/2012 confirmed acute infarct involving the right cerebellar hemisphere no hemorrhage was seen . Speech therapy followup notes cognition to be within functional limits for tasks assessed. Physical therapy ongoing noting suspect vestibular disorder and await further workup. Recommendations were made for physical medicine rehabilitation consult to consider inpatient rehabilitation services. Patient was felt to be a good candidate for inpatient rehabilitation services and was admitted for comprehensive rehabilitation program Patient transferred to CIR on 03/23/2012 .    Patient currently requires min to mod assist with basic self-care  skills secondary to muscle weakness, decreased visual acuity and decreased visual motor skills, decreased midline orientation, central origin and  decreased sitting balance, decreased standing balance, decreased postural control and decreased balance strategies.  Prior to hospitalization, patient was independent with all ADLS, IADLs, and was driving.  Patient will benefit from skilled intervention to increase independence with basic self-care skills prior to discharge home with care partner.  Anticipate patient will require 24 hour supervision and follow up home health.  OT - End of Session Activity Tolerance: Tolerates 10 - 20 min activity with multiple rests OT Assessment Rehab Potential: Good Barriers to Discharge: None OT Plan OT Frequency: 1-2 X/day, 60-90 minutes;5 out of 7 days Estimated Length of Stay: 2 weeks OT Treatment/Interventions: Balance/vestibular training;Discharge planning;DME/adaptive equipment instruction;Functional mobility training;Patient/family education;Self Care/advanced ADL retraining;Therapeutic Activities;Therapeutic Exercise;Visual/perceptual remediation/compensation OT Recommendation Follow Up Recommendations: Home health OT Equipment Recommended: Tub/shower bench  OT Evaluation Precautions/Restrictions  Precautions Precautions: Fall Precaution Comments: poor balance, leans to right Restrictions Weight Bearing Restrictions: No  Pain Pain Assessment Pain Assessment: 0-10 Pain Score:   5 Pain Type: Acute pain Pain Location: Head Pain Orientation: Right Pain Descriptors: Aching Pain Frequency: Occasional Pain Onset: Gradual Patients Stated Pain Goal: 2 Pain Intervention(s): RN made aware Multiple Pain Sites: No Home Living/Prior Functioning Home Living Lives With: Alone Available Help at Discharge: Family Type of Home: House Home Access: Stairs to enter Secretary/administrator of Steps: 4 Entrance Stairs-Rails: Right Home Layout: One level Bathroom Shower/Tub: Forensic scientist: Standard Home Adaptive Equipment: Straight cane;Walker - standard Prior  Function Level of Independence: Independent with basic ADLs;Independent with homemaking with ambulation;Independent with gait;Independent with transfers Able to Take Stairs?: Yes Driving: Yes Vocation: Retired ADL  Refer to [place] [facility]/Perception  Vision - History Baseline Vision: Other (comment) (blind in right eye) Visual History: Cataracts Patient Visual Report: Eye fatigue/eye pain/headache;Nausea/blurring vision with head movement Vision - Assessment Eye Alignment: Within Functional Limits Vision Assessment: Vision tested Ocular Range of Motion: Restricted looking down Tracking/Visual Pursuits: Decreased smoothness of horizontal tracking;Decreased smoothness of vertical tracking Saccades: Decreased speed of saccadic movement Perception Perception: Impaired Comments: impaired body awareness of midline Praxis Praxis: Intact  Cognition Overall Cognitive Status: Appears within functional limits for tasks assessed Orientation Level: Oriented X4 Sensation Sensation Light Touch: Appears Intact Stereognosis: Appears Intact Hot/Cold: Appears Intact Proprioception: Impaired by gross assessment Additional Comments: pt requires cues to find midline in standing, sitting Coordination Gross Motor Movements are Fluid and Coordinated: No Fine Motor Movements are Fluid and Coordinated: Yes Coordination and Movement Description: leans far R during all dynamic tasks Motor  Motor Motor: Abnormal postural alignment and control Mobility - Refer to FIM Bed Mobility Supine to Sit: 3: Mod assist Supine to Sit Details (indicate cue type and reason): cues for sequence, assist for wt shifting and balance Sitting - Scoot to Edge of Bed: 4: Min assist Sitting - Scoot to [place] of Bed Details (indicate cue type and reason): assist for balance  Trunk/Postural Assessment  Cervical Assessment Cervical Assessment: Within Functional Limits Thoracic Assessment Thoracic Assessment: Within Functional  Limits Lumbar Assessment Lumbar Assessment: Within Functional Limits Postural Control Postural Control: Deficits on evaluation (leans R, requires assist to correct)  Balance Static Sitting Balance Static Sitting - Level of Assistance: 4: Min assist Dynamic Sitting Balance Dynamic Sitting - Level of Assistance: 3: Mod assist (for functional task) Static Standing Balance Static Standing - Level of Assistance: 3: Mod assist Dynamic Standing Balance Dynamic Standing - Level of Assistance: 2:  Max assist Extremity/Trunk Assessment RUE Assessment RUE Assessment: Within Functional Limits LUE Assessment LUE Assessment: Exceptions to WFL LUE AROM (degrees) LUE Overall AROM Comments: previous left RTC injury, shoulder flexion to 100, able to use arm in functional tasks  See FIM for current functional status Refer to Care Plan for Long Term Goals  Recommendations for other services: None and Other: vestibular evaluation  Discharge Criteria: Patient will be discharged from OT if patient refuses treatment 3 consecutive times without medical reason, if treatment goals not met, if there is a change in medical status, if patient makes no progress towards goals or if patient is discharged from hospital.  The above assessment, treatment plan, treatment alternatives and goals were discussed and mutually agreed upon: by patient and by family  SAGUIER,JULIA 03/24/2012, 11:27 AM

## 2012-03-24 NOTE — Progress Notes (Signed)
Social Work Assessment and Plan Social Work Assessment and Plan  Patient Details  Name: Lynn Weeks MRN: [id number] Date of Birth: 1919/05/13  Today's Date: 03/24/2012  Problem List:  Patient Active Problem List  Diagnosis  . CAD (coronary artery disease)  . Complete heart block  . Hypertensive heart disease without congestive heart failure  . Hyperlipemia  . Type 2 diabetes  . Chronic kidney disease (CKD), stage III (moderate)  . Hyponatremia  . Murmur, cardiac  . Near syncope  . Leukocytosis  . CVA (cerebral infarction)  . Stroke   Past Medical History:  Past Medical History  Diagnosis Date  . Complete heart block 05/08/2011  . CAD (coronary artery disease) 05/08/2011  . Hypertensive heart disease without congestive heart failure 05/08/2011  . Hyponatremia 05/08/2011  . Hyperlipemia 05/08/2011  . Type 2 diabetes 05/08/2011  . Murmur, cardiac 05/08/2011  . Osteoarthritis   . Anxiety    Past Surgical History:  Past Surgical History  Procedure Date  . Tonsillectomy   . Left total knee replacement   . Abdominal hysterectomy   . Cataract extraction, bilateral   . Right shoulder hemiarthroplasty 2005    [name]  . Pacemaker insertion    Social History:  reports that she has never smoked. She has never used smokeless tobacco. She reports that she does not drink alcohol or use illicit drugs.  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  Ususally it is pt doing for others, but now the shoe is on the other foot.   Pt is  motivated to be independent again.  Social History Preferred language: English Religion: Wesleyan Cultural Background: No issues Education: High School Read: Yes Write: Yes Employment Status: Retired Fish farm manager Issues: NO issues Guardian/Conservator: NOne, according to MD pt is capable of making her own decisions   Abuse/Neglect Physical Abuse: Denies Verbal Abuse: Denies Sexual Abuse: Denies Exploitation of patient/patient's resources: Denies Self-Neglect: Denies  Emotional Status Pt's affect, behavior adn adjustment status: Pt is motivated and has high expectations of herself, she reports: " I need to do for myself, I always have."  Granddaughter reports she is a very independent woman.  She si always helping others. Recent Psychosocial Issues: Other meidcal issues but she has managed them with MD Pyschiatric History: History of anxiety-takes meds for and feels they help her.  Depression screen score-2.  Pt is very optimisitic and motivated to recover.  Granddaughter reports she has not had any issues with this. Substance Abuse History: No issues  Patient / Family Perceptions, Expectations & Goals Pt/Family understanding of illness & functional limitations: Pt and family have a good understanding of her stroke and deficits, they feel she has made progress since yesterday.  Family is here daily and involved in her care.  Pt feels she is not doing as well as everyone says,should be independent. Premorbid pt/family roles/activities: Mother, Grandmother, Home owner, Mining engineer, Retiree, etc Anticipated changes in roles/activities/participation: Plans to resume Pt/family expectations/goals: Pt states: " I hope I am doing well, I want to be."  Granddaughter reports; " She has always been very independent and helped others, different for her to be in the  other role."  [employer]: None Premorbid Home Care/DME Agencies: None Transportation  available at discharge: [employer] referrals recommended: Support group (specify) (CVA Support Group)  Discharge Planning Living Arrangements: Alone Support Systems: Children;Other relatives;Friends/neighbors;Church/faith community Type of Residence: Private residence Insurance Resources: Administrator (specify) Geophysicist/field seismologist) Financial Resources: Restaurant manager, fast food Screen Referred: No Living Expenses: Own Money Management: Patient Do you have any problems obtaining your medications?: No Home Management: Pt did her own home management and drove Patient/Family Preliminary Plans: Go to granddaughter's home with family providing 24 hour supervision.  They are aware she will need this upon discharge.  Very supportive family and here daily. Social Work Anticipated Follow Up Needs: HH/OP;Support Group DC Planning Additional Notes/Comments: Very supportive fmaily and high level pt should do well here.  Clinical Impression Pleasant spry 78 yo lady.  She is very active and motivated to improve, family supportive and committed to her.  Should do very well here and not be here long.  Lucy Chris 03/24/2012, 1:28 PM

## 2012-03-24 NOTE — Progress Notes (Signed)
Patient ID: Lynn Weeks, female   DOB: [birth year], 78 y.o.   MRN: [id number] Lynn Weeks is a 78 y.o. right-handed female with history of type 2 diabetes mellitus, hypertension, coronary artery disease with pacemaker as well his history of inner ear problems. Patient lives alone and was independent prior to admission driving short distances. Admitted 03/20/2012 with near syncope and dizziness. Cranial CT scan with no acute changes. Echocardiogram pending. Cardiac panel was negative. Carotid Dopplers with 40-59% right ICA stenosis.Marland Kitchen MRI of the brain not done secondary to pacemaker. Noted admission white blood cell count of 19,200 as well as review old records showing white blood cell count 19,800 in October of 2012. Chest x-ray was negative for infiltrate and urinalysis study negative with latest white blood cell count 15,500 on 03/20/2012 and monitored. Placed on scheduled Antivert 25 mg twice a day for dizziness. Neurology services consulted suspect possible right cerebellar infarcts not seen on initial cranial CT scan. Placed on Plavix therapy  Subjective/Complaints:   Objective: Vital Signs: Blood pressure 157/62, pulse 77, temperature 98.4 F (36.9 C), temperature source Oral, resp. rate 19, weight 47.7 kg (105 lb 2.6 oz), SpO2 96.00%. Ct Head Wo Contrast  03/23/2012  *RADIOLOGY REPORT*  Clinical Data: Continued dizziness and recent fall, left-sided headache  CT HEAD WITHOUT CONTRAST  Technique:  Contiguous axial images were obtained from the base of the skull through the vertex without contrast.  Comparison: 03/20/2012.  Findings: Mild atrophic changes and chronic white matter ischemic change is again identified. There is a new geographic area of decreased attenuation within the right server about the severe which measures approximate 3.6 x 3.1 cm.  This is consistent with an acute infarct.  No other areas of acute ischemia are noted. Some mild mass effect upon the fourth ventricle is seen.   IMPRESSION: Acute infarct involving the right cerebellar hemisphere.  No hemorrhage is seen.  These changes are new from the previous exam.   Original Report Authenticated By: Phillips Odor, M.D.    Results for orders placed during the hospital encounter of 03/23/12 (from the past 72 hour(s))  GLUCOSE, CAPILLARY     Status: Abnormal   Collection Time   03/23/12  9:09 PM      Component Value Range Comment   Glucose-Capillary 115 (*) 70 - 99 mg/dL   CBC WITH DIFFERENTIAL     Status: Abnormal   Collection Time   03/24/12  6:06 AM      Component Value Range Comment   WBC 11.8 (*) 4.0 - 10.5 K/uL WHITE COUNT CONFIRMED ON SMEAR   RBC 4.51  3.87 - 5.11 MIL/uL    Hemoglobin 14.0  12.0 - 15.0 g/dL    HCT 40.9  81.1 - 91.4 %    MCV 90.0  78.0 - 100.0 fL    MCH 31.0  26.0 - 34.0 pg    MCHC 34.5  30.0 - 36.0 g/dL    RDW 78.2  95.6 - 21.3 %    Platelets 360  150 - 400 K/uL    Neutrophils Relative 74  43 - 77 %    Lymphocytes Relative 17  12 - 46 %    Monocytes Relative 6  3 - 12 %    Eosinophils Relative 2  0 - 5 %    Basophils Relative 1  0 - 1 %    Neutro Abs 8.8 (*) 1.7 - 7.7 K/uL    Lymphs Abs 2.0  0.7 - 4.0 K/uL  Monocytes Absolute 0.7  0.1 - 1.0 K/uL    Eosinophils Absolute 0.2  0.0 - 0.7 K/uL    Basophils Absolute 0.1  0.0 - 0.1 K/uL    Smear Review MORPHOLOGY UNREMARKABLE     COMPREHENSIVE METABOLIC PANEL     Status: Abnormal   Collection Time   03/24/12  6:06 AM      Component Value Range Comment   Sodium 136  135 - 145 mEq/L    Potassium 3.7  3.5 - 5.1 mEq/L    Chloride 100  96 - 112 mEq/L    CO2 25  19 - 32 mEq/L    Glucose, Bld 117 (*) 70 - 99 mg/dL    BUN 19  6 - 23 mg/dL    Creatinine, Ser 9.60  0.50 - 1.10 mg/dL    Calcium 8.9  8.4 - 45.4 mg/dL    Total Protein 6.1  6.0 - 8.3 g/dL    Albumin 3.0 (*) 3.5 - 5.2 g/dL    AST 16  0 - 37 U/L    ALT 8  0 - 35 U/L    Alkaline Phosphatase 61  39 - 117 U/L    Total Bilirubin 0.6  0.3 - 1.2 mg/dL    GFR calc non Af Amer 52  (*) >90 mL/min    GFR calc Af Amer 60 (*) >90 mL/min   GLUCOSE, CAPILLARY     Status: Abnormal   Collection Time   03/24/12  7:16 AM      Component Value Range Comment   Glucose-Capillary 108 (*) 70 - 99 mg/dL    Comment 1 Notify RN        HEENT: R lens opacification Cardio: RRR Resp: CTA B/L GI: BS positive Extremity:  No Edema Skin:   Intact Neuro: Alert/Oriented, Anxious, Cranial Nerve II-XII normal, Normal Sensory, Normal Motor and Other poor sitting balance leans to Right Musc/Skel:  Other Bilateral shoulder contracture   Assessment/Plan: 1. Functional deficits secondary to Thrombotic R cerebellar infarct which require 3+ hours per day of interdisciplinary therapy in a comprehensive inpatient rehab setting. Physiatrist is providing close team supervision and 24 hour management of active medical problems listed below. Physiatrist and rehab team continue to assess barriers to discharge/monitor patient progress toward functional and medical goals. FIM:             FIM - Bed/Chair Transfer Bed/Chair Transfer: 3: Chair or W/C > Bed: Mod A (lift or lower assist);3: Bed > Chair or W/C: Mod A (lift or lower assist);3: Supine > Sit: Mod A (lifting assist/Pt. 50-74%/lift 2 legs  FIM - Locomotion: Wheelchair Distance: 30 Locomotion: Wheelchair: 1: Travels less than 50 ft with minimal assistance (Pt.>75%) FIM - Locomotion: Ambulation Ambulation/Gait Assistance: 2: Max assist Locomotion: Ambulation: 1: Travels less than 50 ft with maximal assistance (Pt: 25 - 49%)  Comprehension Comprehension Mode: Auditory Comprehension: 5-Understands basic 90% of the time/requires cueing < 10% of the time  Expression Expression Mode: Verbal Expression: 4-Expresses basic 75 - 89% of the time/requires cueing 10 - 24% of the time. Needs helper to occlude trach/needs to repeat words.  Social Interaction Social Interaction: 4-Interacts appropriately 75 - 89% of the time - Needs redirection for  appropriate language or to initiate interaction.  Problem Solving Problem Solving: 3-Solves basic 50 - 74% of the time/requires cueing 25 - 49% of the time  Memory Memory: 4-Recognizes or recalls 75 - 89% of the time/requires cueing 10 - 24% of the time  1. 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 DVT Prophylaxis/Anticoagulation: Subcutaneous heparin. Monitor platelet counts any signs of bleeding  3. Diabetes mellitus. Hemoglobin A1c 6.5. Glucophage 500 mg twice a day. Check blood sugars a.c. and at bedtime  4. Neuropsych: This patient is capable of making decisions on his/her own behalf.  5. Hypertension. Coreg 40 mg daily, lisinopril 40 mg daily. Monitor with increased activity. Secondary prevention education  6. Hyperlipidemia. Zocor  7. Blindness right eye  8. Leukocytosis. White blood cell count 15,500 03/21/2012. Urinalysis and chest x-ray negative. Patient is afebrile. Repeat CBC shows WBCs normalizing    LOS (Days) 1 A FACE TO FACE EVALUATION WAS PERFORMED  KIRSTEINS,ANDREW E 03/24/2012, 9:28 AM

## 2012-03-24 NOTE — Plan of Care (Signed)
Overall Plan of Care [facility]) Patient Details Name: Lynn Weeks MRN: [id number] DOB: Dec 06, 1918  Diagnosis:  Rehabilitation for right cerebellar infarct  Primary Diagnosis:    Stroke Co-morbidities: Complete heart block  05/08/2011  .  CAD (coronary artery disease)  05/08/2011  .  Hypertensive heart disease without congestive heart failure  05/08/2011  .  Hyponatremia  05/08/2011  .  Hyperlipemia  05/08/2011  .  Type 2 diabetes  05/08/2011  .  Murmur, cardiac  05/08/2011  .  Osteoarthritis  .  Anxiety    Functional Problem List  Patient demonstrates impairments in the following areas: Medication Management and Nutrition  Basic ADL's: eating, grooming, bathing, dressing and toileting Advanced ADL's: simple meal preparation  Transfers:  bed mobility, bed to chair, toilet, tub/shower and car Locomotion:  ambulation and wheelchair mobility  Additional Impairments:  Functional use of upper extremity  Anticipated Outcomes Item Anticipated Outcome  Eating/Swallowing    Basic self-care    Tolieting  Mod assist for toileting transfers remain continent  Bowel/Bladder  Continent bowel bladder mod independent to remain continent   Transfers    Locomotion    Communication    Cognition    Pain  Pain 3 or less 1-10 scale  Safety/Judgment    Other  Skin: no new breakdown    Therapy Plan: PT Frequency: 1-2 X/day, 60-90 minutes OT Frequency: 1-2 X/day, 60-90 minutes;5 out of 7 days     Team Interventions: Item RN PT OT SLP SW TR Other  Self Care/Advanced ADL Retraining         Neuromuscular Re-Education         Therapeutic Activities      x   UE/LE Strength Training/ROM      x   UE/LE Coordination Activities      x   Visual/Perceptual Remediation/Compensation         DME/Adaptive Equipment Instruction      x   Therapeutic Exercise      x   Balance/Vestibular Training      x   Patient/Family Education x     x   Cognitive  Remediation/Compensation         Functional Mobility Training      x   Ambulation/Gait Water quality scientist Reintegration      x   Dysphagia/Aspiration Film/video editor         Bladder Management x        Bowel Management x        Disease Management/Prevention x        Pain Management x        Medication Management x        Skin Care/Wound Management x        Splinting/Orthotics         Discharge Planning      x   Psychosocial Support      x                      Team Discharge Planning: Destination:  Home Projected Follow-up:  PT, OT and Outpatient Projected Equipment Needs:  Information systems manager, Environmental consultant and Wheelchair Patient/family involved in discharge planning:  Yes  MD ELOS:  10d Medical Rehab Prognosis:  Good Assessment: 78 year old female very active and independent admitted for right cerebellar CVA causing right hemiataxia as well as truncal ataxia. She now requires CIR level PT OT as well as 24 7 rehabilitation are and and M.D.

## 2012-03-24 NOTE — Progress Notes (Deleted)
Patient admitted to 4031 at 1535. Patient alert and oriented but very anxious. Wife accompanied. L H-P and slurred speech noted. Patient and wife oriented to unit, rehab schedule, call bell system, and safety plan. Understanding verbalized. Lynn Weeks       

## 2012-03-24 NOTE — Progress Notes (Signed)
Physical Therapy Note  Patient Details  Name: KILAH DRAHOS MRN: [id number] Date of Birth: 1918-10-06 Today's Date: 03/24/2012  Time: 1120-1138 18 minutes  No c/o pain.  Pt c/o increased dizziness since previous PT session.  Pt hesitant to get out of bed due to dizziness.  Encouraged pt and educated pt on need to get out of bed to help relieve dizziness, pt agreeable with encouragement.  Bed mobility with min A with cuing for safe technique.  Sit to stand repetitions with dizziness resolving after 10-15 seconds standing.  Gait with max A, HHA.  Pt c/o dizziness throughout gait training.  Pt encouraged to sit up through lunch and to attempt to eat all meals sitting, pt agreeable.  Individual therapy   DONAWERTH,KAREN 03/24/2012, 11:38 AM

## 2012-03-24 NOTE — Evaluation (Signed)
Recreational Therapy Assessment and Plan  Patient Details  Name: Lynn Weeks MRN: [id number] Date of Birth: 1918/10/27 Today's Date: 03/24/2012  Rehab Potential: Good ELOS: 2 weeks  Problem List:  Patient Active Problem List   Diagnosis   .  CAD (coronary artery disease)   .  Complete heart block   .  Hypertensive heart disease without congestive heart failure   .  Hyperlipemia   .  Type 2 diabetes   .  Chronic kidney disease (CKD), stage III (moderate)   .  Hyponatremia   .  Murmur, cardiac   .  Near syncope   .  Leukocytosis   .  CVA (cerebral infarction)   .  Stroke    Past Medical History:  Past Medical History   Diagnosis  Date   .  Complete heart block  05/08/2011   .  CAD (coronary artery disease)  05/08/2011   .  Hypertensive heart disease without congestive heart failure  05/08/2011   .  Hyponatremia  05/08/2011   .  Hyperlipemia  05/08/2011   .  Type 2 diabetes  05/08/2011   .  Murmur, cardiac  05/08/2011   .  Osteoarthritis    .  Anxiety     Past Surgical History:  Past Surgical History   Procedure  Date   .  Tonsillectomy    .  Left total knee replacement    .  Abdominal hysterectomy    .  Cataract extraction, bilateral    .  Right shoulder hemiarthroplasty  2005     [name]   .  Pacemaker insertion     Assessment & Plan  Clinical Impression: Patient is a 78 y.o. year old female with recent admission to the hospital on 03/20/2012 with near syncope and dizziness. Cranial CT scan with no acute changes. Carotid Dopplers with 40-59% right ICA stenosis.Marland Kitchen MRI of the brain not done secondary to pacemaker. Placed on scheduled Antivert 25 mg twice a day for dizziness. Neurology services consulted suspect possible right cerebellar infarcts not seen on initial cranial CT scan. Placed on Plavix therapy. Patient had been on aspirin 81 mg prior to admission. Subcutaneous heparin added for DVT prophylaxis. Followup cranial CT scan completed 03/23/2012 confirmed  acute infarct involving the right cerebellar hemisphere no hemorrhage was seen .Patient transferred to CIR on 03/23/2012 .   Patient presents with decreased activity tolerance, decreased functional mobility, decreased balance limiting pt's independence with leisure/community pursuits.  Leisure History/Participation Premorbid leisure interest/current participation: Ashby Dawes - Flower gardening;Nature - Vegetable gardening;Nature - Earnestine Leys care;Community - Shopping mall;Community - Grocery store;Sports - Baseball;Sports - Basketball;Crafts - Knitting/Crocheting (watch great grandkids ballgames, soccer, tennis) Expression Interests: Music (Comment) (jimmy swaggart singing) Other Leisure Interests: Television;Reading;Cooking/Baking Leisure Participation Style: With Family/Friends Awareness of Community Resources: Good-identify 3 post discharge leisure resources Psychosocial / Spiritual Spiritual Interests: Church Patient agreeable to Pet Therapy: Yes Does patient have pets?: No Social interaction - Mood/Behavior: Cooperative Film/video editor for Education?: Yes Recreational Therapy Orientation Orientation -Reviewed with patient: Available activity resources Strengths/Weaknesses Patient Strengths/Abilities: Willingness to participate;Active premorbidly Patient weaknesses: Physical limitations  Plan Rec Therapy Plan Is patient appropriate for Therapeutic Recreation?: Yes Rehab Potential: Good Treatment times per week: Min 1 time per week  TR Treatment/Interventions: Adaptive equipment instruction;1:1 session;Balance/vestibular training;Cognitive remediation/compensation;Community reintegration;Patient/family education;Functional mobility training;Recreation/leisure participation;Therapeutic activities;Therapeutic exercise;UE/LE Coordination activities;Visual/perceptual remediation/compensation  Recommendations for other services: None  Discharge Criteria: Patient will be  discharged from TR if patient refuses treatment 3 consecutive times without  medical reason.  If treatment goals not met, if there is a change in medical status, if patient makes no progress towards goals or if patient is discharged from hospital.  The above assessment, treatment plan, treatment alternatives and goals were discussed and mutually agreed upon: by patient  Shanyla Marconi 03/24/2012, 9:16 AM

## 2012-03-24 NOTE — Progress Notes (Signed)
Occupational Therapy Session Note  Patient Details  Name: Lynn Weeks MRN: [id number] Date of Birth: [birth year]  Today's Date: 03/24/2012 Time: 1330-1400 Time Calculation (min): 30 min  Short Term Goals: Week 1:  OT Short Term Goal 1 (Week 1): Pt will be able to don pants over feet with supervision. OT Short Term Goal 2 (Week 1): Pt will don socks and shoes with min assist. OT Short Term Goal 3 (Week 1): Pt will transfer to toilet with min assist. OT Short Term Goal 4 (Week 1): Pt will transfer to walkin shower tub bench with min assist. OT Short Term Goal 5 (Week 1): Pt will be able to stand at sink to brush teeth with min assist.  Skilled Therapeutic Interventions/Progress Updates:  Balance/vestibular training;Discharge planning;DME/adaptive equipment instruction;Functional mobility training;Patient/family education;Self Care/advanced ADL retraining;Therapeutic Activities;Therapeutic Exercise;Visual/perceptual remediation/compensation  1:1 OT with focus on functional ambulation in room with HHA, obtaining shoes, donning socks and shoes, and completing weight shifting activity in standing at sink.  Pt asleep upon arrival with granddaughter at bedside.  Pt easy to arouse and willing to participate.  Pt required mod-max assist with hand held assist to ambulate to closet to obtain shoes.  Pt with frequent reports of dizziness.  Educated pt on fixing vision to immovable objects to decrease dizziness.  Approx 15 seconds before dizziness would subside.  Pt able to don socks and shoes without assist, required min steadying assist in chair as pt has extreme Rt lean in sitting and standing.  Use of mirror for visual feedback to increase midline standing, with immediate results, but unable to carryover with out visual feedback.  Therapy Documentation Precautions:  Precautions Precautions: Fall Precaution Comments: poor balance, leans to right Restrictions Weight Bearing Restrictions:  No Pain: Pain Assessment Pain Assessment: 0-10 Pain Score:   5 Pain Type: Acute pain Pain Location: Head Pain Orientation: Right Pain Descriptors: Aching Pain Onset: Gradual Pain Intervention(s): RN made aware  See FIM for current functional status  Therapy/Group: Individual Therapy  Leonette Monarch 03/24/2012, 2:58 PM

## 2012-03-24 NOTE — H&P (View-Only) (Signed)
Physical Medicine and Rehabilitation Admission H&P    Chief Complaint  Patient presents with  . Near Syncope  : HPI: Lynn Weeks is a 78 y.o. right-handed female with history of type 2 diabetes mellitus, hypertension, coronary artery disease with pacemaker as well his history of inner ear problems. Patient lives alone and was independent prior to admission driving short distances. Admitted 03/20/2012 with near syncope and dizziness. Cranial CT scan with no acute changes. Echocardiogram pending. Cardiac panel was negative. Carotid Dopplers with 40-59% right ICA stenosis.Marland Kitchen MRI of the brain not done secondary to pacemaker.  Noted admission white blood cell count of 19,200 as well as review old records showing white blood cell count 19,800 in October of 2012. Chest x-ray was negative for infiltrate and urinalysis study negative with latest white blood cell count 15,500 on 03/20/2012 and monitored. Placed on scheduled Antivert 25 mg twice a day for dizziness. Neurology services consulted suspect possible right cerebellar infarcts not seen on initial cranial CT scan. Placed on Plavix therapy. Patient had been on aspirin 81 mg prior to admission. Subcutaneous heparin added for DVT prophylaxis. Followup cranial CT scan completed 03/23/2012 confirmed acute infarct involving the right cerebellar hemisphere no hemorrhage was seen . Speech therapy followup notes cognition to be within functional limits for tasks assessed. Physical therapy ongoing noting suspect vestibular disorder and await further workup. Recommendations were made for physical medicine rehabilitation consult to consider inpatient rehabilitation services. Patient was felt to be a good candidate for inpatient rehabilitation services and was admitted for comprehensive rehabilitation program  Review of Systems  HENT: Positive for hearing loss.  Eyes:  Blindness right eye  Musculoskeletal: Positive for myalgias.  Neurological: Positive for  dizziness.  All other systems reviewed and are negative   Past Medical History  Diagnosis Date  . Complete heart block 05/08/2011  . CAD (coronary artery disease) 05/08/2011  . Hypertensive heart disease without congestive heart failure 05/08/2011  . Hyponatremia 05/08/2011  . Hyperlipemia 05/08/2011  . Type 2 diabetes 05/08/2011  . Murmur, cardiac 05/08/2011  . Osteoarthritis   . Anxiety    Past Surgical History  Procedure Date  . Tonsillectomy   . Left total knee replacement   . Abdominal hysterectomy   . Cataract extraction, bilateral   . Right shoulder hemiarthroplasty 2005    Lynn Weeks  . Pacemaker insertion    Family History  Problem Relation Age of Onset  . Stroke Mother    Social History:  reports that she has never smoked. She has never used smokeless tobacco. She reports that she does not drink alcohol or use illicit drugs. Allergies:  Allergies  Allergen Reactions  . Ciprofloxacin Nausea And Vomiting  . Crestor (Rosuvastatin Calcium) Swelling  . Escitalopram Oxalate Other (See Comments)    Yawning and insomnia   . Lipitor (Atorvastatin Calcium) Other (See Comments)    myalgia  . Metronidazole Nausea And Vomiting  . Sulfa Antibiotics Swelling   Medications Prior to Admission  Medication Sig Dispense Refill  . amLODipine (NORVASC) 5 MG tablet Take 5 mg by mouth daily.        Marland Kitchen aspirin 81 MG tablet Take 81 mg by mouth daily.        . bisacodyl (DULCOLAX) 5 MG EC tablet Take 10 mg by mouth daily as needed. For constipation      . carvedilol (COREG CR) 40 MG 24 hr capsule Take 40 mg by mouth daily.        . furosemide (LASIX)  20 MG tablet Take 10-20 mg by mouth 2 (two) times daily as needed. For swelling      . lisinopril (PRINIVIL,ZESTRIL) 40 MG tablet Take 40 mg by mouth daily.        . metFORMIN (GLUCOPHAGE) 500 MG tablet Take 500 mg by mouth 2 (two) times daily with a meal.       . ofloxacin (OCUFLOX) 0.3 % ophthalmic solution Place 1 drop into both eyes  daily.       . simvastatin (ZOCOR) 20 MG tablet Take 20 mg by mouth at bedtime.          Home: Home Living Lives With: Alone Available Help at Discharge: Family;Available 24 hours/day (not right now, but working on it) Type of Home: House Home Access: Stairs to enter Secretary/administrator of Steps: 4 Entrance Stairs-Rails: Right Home Layout: One level Bathroom Shower/Tub: Engineer, manufacturing systems: Standard Home Adaptive Equipment: Environmental consultant - standard;Straight cane   Functional History: Prior Function Able to Take Stairs?: Yes Vocation: Retired  Functional Status:  Mobility: Bed Mobility Bed Mobility: Rolling Right;Rolling Left;Right Sidelying to Sit Rolling Right: 5: Supervision;With rail Rolling Left: 5: Supervision;With rail Right Sidelying to Sit: 3: Mod assist;With rails;HOB flat Transfers Transfers: Sit to Stand;Stand to Sit Sit to Stand: 3: Mod assist;With upper extremity assist;With armrests;From chair/3-in-1 Stand to Sit: 2: Max assist;With upper extremity assist;With armrests;To chair/3-in-1 Stand to Sit: Patient Percentage: 40% Ambulation/Gait Ambulation/Gait Assistance: 1: +2 Total assist Ambulation/Gait: Patient Percentage: 50% Ambulation Distance (Feet): 20 Feet Assistive device: Rolling walker;2 person hand held assist Ambulation/Gait Assistance Details: attempted use of RW for pt to ? utilize UEs to balance herself, however pt still not able to right herself over her base of support; removed RW after 10 ft and proceeded with bil HHA with increasisng Rt lean as pt progressed/fatigued. Rt LE with near scissoring as pt fatigued Gait Pattern: Step-to pattern;Decreased step length - right;Narrow base of support    ADL: ADL Eating/Feeding: Simulated;Independent Where Assessed - Eating/Feeding: Bed level (or supported sit) Grooming: Simulated;Set up;Supervision/safety Where Assessed - Grooming: Supported sitting Upper Body Bathing: Simulated;Set  up;Supervision/safety Where Assessed - Upper Body Bathing: Supported sitting Lower Body Bathing: Moderate assistance;Simulated (due to balance, with total A to stand dynamically) Where Assessed - Lower Body Bathing: Supported sit to stand Upper Body Dressing: Simulated;Maximal assistance (due to balance issues) Where Assessed - Upper Body Dressing: Supported sitting Lower Body Dressing: Performed;Min guard (to donn socks; however balance was all over the place) Where Assessed - Lower Body Dressing: Supported sitting Toilet Transfer: Simulated;Maximal assistance Toilet Transfer Method: Sit to Barista:  (recliner, two steps forward and two steps back to recliner) Equipment Used: Gait belt Transfers/Ambulation Related to ADLs: Max A for transfers ADL Comments: When pt brings her back off of the back of the recliner she immediately starts to lean to the right. Once she was assisted to get static sitting balance with miin A and Mod verbal cues for weight shifts I asked her to take her socks off; she immediately crossed one leg over the other and lost her balace to the right., continued to remove sock with one hand (when she put it back on with both hands her sitting balance improved with this bil task. Same thing happened with the other leg. Stood patient and more of her weight was on ther RLE. Asked her if her weight was equal on both feet and she said no, asked her  fix it and find her  balance and she did with only min guard A; however if I asked her to look in any direction (other than to ther left) she would immediately start falling to the right--when she looke to the left it actually helped her balance.  Cognition: Cognition Overall Cognitive Status: Appears within functional limits for tasks assessed Arousal/Alertness: Awake/alert Orientation Level: Oriented X4 Cognition Overall Cognitive Status: Appears within functional limits for tasks  assessed/performed Arousal/Alertness: Awake/alert Orientation Level: Appears intact for tasks assessed Behavior During Session: [employer] for tasks performed   Blood pressure 131/73, pulse 92, temperature 99.2 F (37.3 C), temperature source Oral, resp. rate 18, height 4\' 6"  (1.372 m), weight 46.448 kg (102 lb 6.4 oz), SpO2 92.00%. Physical Exam  Vitals reviewed.  Constitutional: She is oriented to person, place, and time. She appears well-developed.  HENT: oral mucosa pink, dentition borderline Head: Normocephalic.  Eyes:  Blindness right eye  Neck: Neck supple. No thyromegaly present.  Cardiovascular:  Cardiac rate controlled, no murmur or gallops Pulmonary/Chest: Breath sounds normal. She has no wheezes or rhonchi Abdominal: She exhibits no distension. There is no tenderness. There is no rebound.  Musculoskeletal: She exhibits no edema.  Neurological: She is alert and oriented to person, place, and time.  Patient follows full commands. She is hard of hearing on the left. Speech is clear. Cognitively she's intact. Blind in right eye. 5 beats nystagmus with right lateral gaze and 2 beats to the left. mild limb ataxia on the right. Sensation and strength appear to be within normal limits  Skin: Skin is warm and dry.  Psychiatric: She has a normal mood and affect   Results for orders placed during the hospital encounter of 03/20/12 (from the past 48 hour(s))  CARDIAC PANEL(CRET KIN+CKTOT+MB+TROPI)     Status: Normal   Collection Time   03/21/12 10:35 AM      Component Value Range Comment   Total CK 43  7 - 177 U/L    CK, MB 2.2  0.3 - 4.0 ng/mL    Troponin I <0.30  <0.30 ng/mL    Relative Index RELATIVE INDEX IS INVALID  0.0 - 2.5   GLUCOSE, CAPILLARY     Status: Abnormal   Collection Time   03/21/12 11:30 AM      Component Value Range Comment   Glucose-Capillary 144 (*) 70 - 99 mg/dL   GLUCOSE, CAPILLARY     Status: Abnormal   Collection Time   03/21/12  4:12 PM      Component  Value Range Comment   Glucose-Capillary 298 (*) 70 - 99 mg/dL    Comment 1 Documented in Chart      Comment 2 Notify RN     GLUCOSE, CAPILLARY     Status: Abnormal   Collection Time   03/21/12  9:16 PM      Component Value Range Comment   Glucose-Capillary 144 (*) 70 - 99 mg/dL    Comment 1 Notify RN     GLUCOSE, CAPILLARY     Status: Abnormal   Collection Time   03/22/12 11:51 AM      Component Value Range Comment   Glucose-Capillary 149 (*) 70 - 99 mg/dL   VITAMIN O13     Status: Normal   Collection Time   03/22/12 12:27 PM      Component Value Range Comment   Vitamin B-12 854  211 - 911 pg/mL   TSH     Status: Normal   Collection Time   03/22/12  12:27 PM      Component Value Range Comment   TSH 1.257  0.350 - 4.500 uIU/mL   GLUCOSE, CAPILLARY     Status: Abnormal   Collection Time   03/22/12  4:39 PM      Component Value Range Comment   Glucose-Capillary 213 (*) 70 - 99 mg/dL    Comment 1 Notify RN     GLUCOSE, CAPILLARY     Status: Abnormal   Collection Time   03/22/12  8:45 PM      Component Value Range Comment   Glucose-Capillary 142 (*) 70 - 99 mg/dL    Comment 1 Notify RN      Comment 2 Documented in Chart     GLUCOSE, CAPILLARY     Status: Abnormal   Collection Time   03/23/12  6:23 AM      Component Value Range Comment   Glucose-Capillary 111 (*) 70 - 99 mg/dL    Comment 1 Documented in Chart      Comment 2 Notify RN      Dg Chest 2 View  03/21/2012  *RADIOLOGY REPORT*  Clinical Data: Dizziness and shortness of breath.  CHEST - 2 VIEW  Comparison: 03/20/2012.  Findings: The cardiac silhouette, mediastinal and hilar contours are stable.  The pacer wires are stable.  Suspect interstitial pulmonary edema with Kerley B lines.  Persistent areas of atelectasis.  No definite pleural effusion.  IMPRESSION: CHF with interstitial edema and persistent areas of atelectasis.   Original Report Authenticated By: P. Loralie Champagne, M.D.     Post Admission Physician  Evaluation: 1. Functional deficits secondary  to thrombotic right cerebellar infarct. 2. Patient is admitted to receive collaborative, interdisciplinary care between the physiatrist, rehab nursing staff, and therapy team. 3. Patient's level of medical complexity and substantial therapy needs in context of that medical necessity cannot be provided at a lesser intensity of care such as a SNF. 4. Patient has experienced substantial functional loss from his/her baseline which was documented above under the "Functional History" and "Functional Status" headings.  Judging by the patient's diagnosis, physical exam, and functional history, the patient has potential for functional progress which will result in measurable gains while on inpatient rehab.  These gains will be of substantial and practical use upon discharge  in facilitating mobility and self-care at the household level. 5. Physiatrist will provide 24 hour management of medical needs as well as oversight of the therapy plan/treatment and provide guidance as appropriate regarding the interaction of the two. 6. 24 hour rehab nursing will assist with bladder management, bowel management, safety, skin/wound care, disease management, medication administration, pain management and patient education  and help integrate therapy concepts, techniques,education, etc. 7. PT will assess and treat for:  fxnl mobiltiy, NMR, balance and coordination, safety.  Goals are: mod I to supervision. 8. OT will assess and treat for: ADL's, NMR, balancd and coordination, fxnl mobility, safety.   Goals are: mod I to minimal assist. 9. SLP will assess and treat for: n/a  10. Case Management and Social Worker will assess and treat for psychological issues and discharge planning. 11. Team conference will be held weekly to assess progress toward goals and to determine barriers to discharge. 12. Patient will receive at least 3 hours of therapy per day at least 5 days per  week. 13. ELOS: 2+ weeks      Prognosis:  good   Medical Problem List and Plan: 1. 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 DVT Prophylaxis/Anticoagulation: Subcutaneous heparin. Monitor platelet counts any signs of bleeding 3. Diabetes mellitus. Hemoglobin A1c 6.5. Glucophage 500 mg twice a day. Check blood sugars a.c. and at bedtime 4. Neuropsych: This patient is capable of making decisions on his/her own behalf. 5. Hypertension. Coreg 40 mg daily, lisinopril 40 mg daily. Monitor with increased activity. Secondary prevention education 6. Hyperlipidemia. Zocor 7. Blindness right eye 8. Leukocytosis. White blood cell count 15,500 03/21/2012. Urinalysis and chest x-ray negative. Patient is afebrile. Will followup CBC 03/23/2012, Ivory Broad, MD

## 2012-03-24 NOTE — Evaluation (Signed)
Physical Therapy Assessment and Plan  Patient Details  Name: Lynn Weeks MRN: [id number] Date of Birth: December 27, 1918  PT Diagnosis: Abnormality of gait, Difficulty walking and Muscle weakness Rehab Potential: Good ELOS: 2 weeks   Today's Date: 03/24/2012 Time: 1308-6578 Time Calculation (min): 72 min  Problem List:  Patient Active Problem List  Diagnosis  . CAD (coronary artery disease)  . Complete heart block  . Hypertensive heart disease without congestive heart failure  . Hyperlipemia  . Type 2 diabetes  . Chronic kidney disease (CKD), stage III (moderate)  . Hyponatremia  . Murmur, cardiac  . Near syncope  . Leukocytosis  . CVA (cerebral infarction)  . Stroke    Past Medical History:  Past Medical History  Diagnosis Date  . Complete heart block 05/08/2011  . CAD (coronary artery disease) 05/08/2011  . Hypertensive heart disease without congestive heart failure 05/08/2011  . Hyponatremia 05/08/2011  . Hyperlipemia 05/08/2011  . Type 2 diabetes 05/08/2011  . Murmur, cardiac 05/08/2011  . Osteoarthritis   . Anxiety    Past Surgical History:  Past Surgical History  Procedure Date  . Tonsillectomy   . Left total knee replacement   . Abdominal hysterectomy   . Cataract extraction, bilateral   . Right shoulder hemiarthroplasty 2005    [name]  . Pacemaker insertion     Assessment & Plan Clinical Impression: Patient is a 78 y.o. year old female with recent admission to the hospital on 03/20/2012 with near syncope and dizziness. Cranial CT scan with no acute changes. Carotid Dopplers with 40-59% right ICA stenosis.Marland Kitchen MRI of the brain not done secondary to pacemaker.  Placed on scheduled Antivert 25 mg twice a day for dizziness. Neurology services consulted suspect possible right cerebellar infarcts not seen on initial cranial CT scan. Placed on Plavix therapy. Patient had been on aspirin 81 mg prior to admission. Subcutaneous heparin added for DVT prophylaxis.  Followup cranial CT scan completed 03/23/2012 confirmed acute infarct involving the right cerebellar hemisphere no hemorrhage was seen .Patient transferred to CIR on 03/23/2012 .   Patient currently requires max with mobility secondary to muscle weakness, decreased coordination and decreased sitting balance, decreased standing balance, decreased postural control and decreased balance strategies.  Prior to hospitalization, patient was mod I with mobility and lived with Alone in a House home.  Home access is 4Stairs to enter.  Patient will benefit from skilled PT intervention to maximize safe functional mobility, minimize fall risk and decrease caregiver burden for planned discharge home with 24 hour supervision.  Anticipate patient will benefit from follow up OP at discharge.  PT - End of Session Activity Tolerance: Tolerates 30+ min activity with multiple rests PT Assessment Rehab Potential: Good PT Plan PT Frequency: 1-2 X/day, 60-90 minutes Estimated Length of Stay: 2 weeks PT Treatment/Interventions: Ambulation/gait training;Community reintegration;Discharge planning;Balance/vestibular training;Functional mobility training;Therapeutic Activities;UE/LE Coordination activities;Wheelchair propulsion/positioning;Patient/family education;UE/LE Strength taining/ROM;Therapeutic Exercise;Neuromuscular re-education;DME/adaptive equipment instruction;Pain management;Stair training PT Recommendation Follow Up Recommendations: Outpatient PT  PT Evaluation Precautions/Restrictions Precautions Precautions: Fall Restrictions Weight Bearing Restrictions: No Pain Pain Assessment Pain Assessment: 0-10 Pain Score:   2 Pain Type: Acute pain Pain Location: Head Pain Intervention(s): RN made aware Multiple Pain Sites: No Home Living/Prior Functioning Home Living Lives With: Alone Available Help at Discharge: Family Type of Home: House Home Access: Stairs to enter Secretary/administrator of Steps:  4 Entrance Stairs-Rails: Right Home Layout: One level Home Adaptive Equipment: Straight cane;Walker - standard Prior Function Level of Independence: Independent with basic ADLs;Independent with  homemaking with ambulation;Independent with gait;Independent with transfers Able to Take Stairs?: Yes Driving: Yes  Cognition Overall Cognitive Status: Appears within functional limits for tasks assessed Sensation Sensation Light Touch: Appears Intact Proprioception: Impaired by gross assessment Additional Comments: pt requires cues to find midline in standing, sitting Coordination Gross Motor Movements are Fluid and Coordinated: No Coordination and Movement Description: leans far R during all dynamic tasks Motor  Motor Motor: Abnormal postural alignment and control  Mobility Bed Mobility Supine to Sit: 3: Mod assist Supine to Sit Details (indicate cue type and reason): cues for sequence, assist for wt shifting and balance Sitting - Scoot to Edge of Bed: 4: Min assist Sitting - Scoot to Edge of Bed Details (indicate cue type and reason): assist for balance Transfers Stand Pivot Transfers: 3: Mod assist Stand Pivot Transfer Details (indicate cue type and reason): cues for wt shifting and posture, mod A to maintain upright balance due to leaning R Locomotion  Ambulation Ambulation/Gait Assistance: 2: Max assist Ambulation Distance (Feet): 25 Feet Assistive device: 1 person hand held assist Ambulation/Gait Assistance Details: Tactile and manual cues to decrease R leaning, needs max A for balance during turning, backward gait Stairs / Additional Locomotion Stairs: Yes Stairs Assistance: 3: Mod assist Stairs Assistance Details (indicate cue type and reason): assist for balance, cues for safety Stair Management Technique: Two rails Number of Stairs: 5  Wheelchair Mobility Wheelchair Mobility: Yes Wheelchair Assistance: 4: Min Education officer, museum: Both upper extremities (limited  by L shoulder pain) Wheelchair Parts Management: Needs assistance Distance: 30  Trunk/Postural Assessment  Cervical Assessment Cervical Assessment: Within Functional Limits Thoracic Assessment Thoracic Assessment: Within Functional Limits Lumbar Assessment Lumbar Assessment: Within Functional Limits Postural Control Postural Control: Deficits on evaluation (leans R, requires assist to correct)  Balance Static Sitting Balance Static Sitting - Level of Assistance: 4: Min assist Dynamic Sitting Balance Dynamic Sitting - Level of Assistance: 3: Mod assist (for functional task) Static Standing Balance Static Standing - Level of Assistance: 3: Mod assist Dynamic Standing Balance Dynamic Standing - Level of Assistance: 2: Max assist Extremity Assessment      RLE Assessment RLE Assessment: Within Functional Limits LLE Assessment LLE Assessment: Within Functional Limits  See FIM for current functional status Refer to Care Plan for Long Term Goals  Recommendations for other services: None  Discharge Criteria: Patient will be discharged from PT if patient refuses treatment 3 consecutive times without medical reason, if treatment goals not met, if there is a change in medical status, if patient makes no progress towards goals or if patient is discharged from hospital.  The above assessment, treatment plan, treatment alternatives and goals were discussed and mutually agreed upon: by patient  Treatment initiated during session: Gait training with R HHA with mod-max A for short distance gait.  Pt requires assist at trunk to maintain balance due to R lean.  Attempt gait with L HHA to encourage pt to lean L, pt required increased assist with this technique, difficulty leaning L during gait.  Attempt gait with RW, pt continues to require mod-max A for gait due to R leaning, will continue to assess appropriate assistive device.  Standing balance training with focus on balance reactions.  Pt  improves balance with reaching L and with functional tasks.  Able to improve static standing balance to close supervision by end of treatment, continues to lose balance to R with dynamic activities.  Pt fatigued at end of session but very motivated to work hard to progress.  Ericberto Padget 03/24/2012, 9:31 AM

## 2012-03-25 ENCOUNTER — Inpatient Hospital Stay (HOSPITAL_COMMUNITY): Payer: Medicare Other | Admitting: Physical Therapy

## 2012-03-25 ENCOUNTER — Inpatient Hospital Stay (HOSPITAL_COMMUNITY): Payer: Medicare Other | Admitting: Occupational Therapy

## 2012-03-25 ENCOUNTER — Inpatient Hospital Stay (HOSPITAL_COMMUNITY): Payer: Medicare Other | Admitting: *Deleted

## 2012-03-25 LAB — GLUCOSE, CAPILLARY: Glucose-Capillary: 116 mg/dL — ABNORMAL HIGH (ref 70–99)

## 2012-03-25 NOTE — Progress Notes (Signed)
Physical Therapy Session Note  Patient Details  Name: Lynn Weeks MRN: [id number] Date of Birth: Jan 04, 1919  Today's Date: 03/25/2012 Time: 0865-7846 Time Calculation (min): 56 min  Short Term Goals: Week 1:  PT Short Term Goal 1 (Week 1): Pt will perform functional transfers with min A PT Short Term Goal 1 - Progress (Week 1): Progressing toward goal PT Short Term Goal 2 (Week 1): Pt will gait with min A 50' in controlled environment PT Short Term Goal 2 - Progress (Week 1): Progressing toward goal PT Short Term Goal 3 (Week 1): Pt will maintain dynamic standing balance during functional task with min A PT Short Term Goal 3 - Progress (Week 1): Progressing toward goal  Skilled Therapeutic Interventions/Progress Updates:    NMR to improved postural control- leans R  Likely d/t vestibular issues- used Biodex postural control to allow visual feedback for midline- min A without UE support,( 5 min x 2) pt blind in R eye so makes sense she is more surface dependent and required up to max A to stand on foam, tending to lose balance posteriorly with delayed and decreased hip strategy.  Progressed to gait with RW working on pt stopping and self correcting lean to R (needed cues to do this with gait but did so I on Biodex) 90 ft mod A controlled environment Stroke education re: cerebellar issues; pt denies dizziness and it became clear she needed instruction to differentiate between vertigo and "off balance feeling" after this discussion she stated she never really felt "dizzy" Therapy Documentation Precautions:  Precautions Precautions: Fall Precaution Comments: poor balance, leans to right Restrictions Weight Bearing Restrictions: No Pain:none        See FIM for current functional status  Therapy/Group: Individual Therapy  Michaelene Song 03/25/2012, 4:04 PM

## 2012-03-25 NOTE — Progress Notes (Signed)
Physical Therapy Session Note/Vestibular Eval  Patient Details  Name: Lynn Weeks MRN: [id number] Date of Birth: January 01, 1919  Today's Date: 03/25/2012 Time: 4098-1191 Time Calculation (min): 75 min  Short Term Goals: Week 1:  PT Short Term Goal 1 (Week 1): Pt will perform functional transfers with min A PT Short Term Goal 2 (Week 1): Pt will gait with min A 50' in controlled environment PT Short Term Goal 3 (Week 1): Pt will maintain dynamic standing balance during functional task with min A  S:  Pt reports that she is always dizzy when she is sitting up. No dizziness when she is lying down. Pt also reports a decrease in symptoms or no symptoms if she keeps her head still while she is sitting. Reports that it started when she had the CVA.  States that she feels like she is spinning that it is similar to her inner ear problems.  She is pretty sure she hit her head when she fell during the CVA.  C/o of right side neck pain and at base of occiput.  Pt is HOH in the left ear.  Skilled Therapeutic Interventions/Progress Updates:  See PT eval for complete pt history.   Difficult to complete vestibular testing due to pt having difficulty understanding/performing the tests correctly.  -Attempted modified vertebral artery test, having pt lean forward and turn head, pt unable to perform due to falling to the right and forward when trying to perform.  -Oculomotor:  Pt able to follow object with eyes without dizziness in all planes.  Note pt is blind in the right eye.  Reports no double vision. -Spontaneous nystagmus:  None observed. -Smooth pursuits:  Pt was unable to perform.  She would turn her head every trial as she moved her eyes. -Saccades:  None noted. -VOR:  Suspect pt having difficulty with gaze stabilization, however, use of "A" to decrease symptoms did not decrease symptoms.  Mobility:  Sit to supine, supine to sit and rolling in bed did not cause symptoms.  ?saw up beating nystagmus  to the right when pt rolled to the left.  When pt returned to sitting she did not have any symptoms for about 3 min and then started to complain with her neck and occiput area hurting and feeling dizzy.  Returned to supine and therapist performed gentle massage and stretching to the area for 10 min.  Pt reported that it felt better.  Returned to sitting with decreased symptoms in sitting.  Pt performed gait with therapist with HHA x 15', pt falling to the right.  Pt aware of this and unable to correct.  BP:  Supine  133/62  Sitting 134/62  A:  Pt presents with possibly a cervicogenic dizziness and a unilateral hypofunction of the vestibular system.  Difficult to get a good picture of pt's impairments due to inability to correctly perform tests.  Concern too about ICA stenosis.  P:  Will incorporate treatment of neck for tightness and pain = cervicogenic and adaptation and habituation to address hypofunction into current therapy treatment and goals.  Therapy Documentation Precautions:    Precautions Precautions: Fall Precaution Comments: poor balance, leans to right Restrictions Weight Bearing Restrictions: No Pain:  No pain See FIM for current functional status  Therapy/Group: Individual Therapy  Georges Mouse 03/25/2012, 11:06 AM

## 2012-03-25 NOTE — Plan of Care (Signed)
Problem: RH BOWEL ELIMINATION Goal: RH STG MANAGE BOWEL WITH ASSISTANCE STG Manage Bowel with min Assistance.  Outcome: Not Progressing No Bm since 03/21/2012. Sorbitol given. Awaiting for result.

## 2012-03-25 NOTE — Progress Notes (Signed)
Clinical Social Worker will sign off for now as social work intervention is no longer needed. Patient is being admitted to CIR. Please consult [place] again if new need arises.  Sabino Niemann, MSW, [employer] [phone]

## 2012-03-25 NOTE — Progress Notes (Signed)
Patient ID: Lynn Weeks, female   DOB: [birth year], 78 y.o.   MRN: [id number] Lynn Weeks is a 78 y.o. right-handed female with history of type 2 diabetes mellitus, hypertension, coronary artery disease with pacemaker as well his history of inner ear problems. Patient lives alone and was independent prior to admission driving short distances. Admitted 03/20/2012 with near syncope and dizziness. Cranial CT scan with no acute changes. Echocardiogram pending. Cardiac panel was negative. Carotid Dopplers with 40-59% right ICA stenosis.Marland Kitchen MRI of the brain not done secondary to pacemaker. Noted admission white blood cell count of 19,200 as well as review old records showing white blood cell count 19,800 in October of 2012. Chest x-ray was negative for infiltrate and urinalysis study negative with latest white blood cell count 15,500 on 03/20/2012 and monitored. Placed on scheduled Antivert 25 mg twice a day for dizziness. Neurology services consulted suspect possible right cerebellar infarcts not seen on initial cranial CT scan. Placed on Plavix therapy  Subjective/Complaints: Tired after therapy yesterday.  Still leaning to R  Objective: Vital Signs: Blood pressure 114/69, pulse 72, temperature 98.5 F (36.9 C), temperature source Oral, resp. rate 18, weight 47.7 kg (105 lb 2.6 oz), SpO2 94.00%. Ct Head Wo Contrast  03/23/2012  *RADIOLOGY REPORT*  Clinical Data: Continued dizziness and recent fall, left-sided headache  CT HEAD WITHOUT CONTRAST  Technique:  Contiguous axial images were obtained from the base of the skull through the vertex without contrast.  Comparison: 03/20/2012.  Findings: Mild atrophic changes and chronic white matter ischemic change is again identified. There is a new geographic area of decreased attenuation within the right server about the severe which measures approximate 3.6 x 3.1 cm.  This is consistent with an acute infarct.  No other areas of acute ischemia are noted. Some mild  mass effect upon the fourth ventricle is seen.  IMPRESSION: Acute infarct involving the right cerebellar hemisphere.  No hemorrhage is seen.  These changes are new from the previous exam.   Original Report Authenticated By: Phillips Odor, M.D.    Results for orders placed during the hospital encounter of 03/23/12 (from the past 72 hour(s))  GLUCOSE, CAPILLARY     Status: Abnormal   Collection Time   03/23/12  9:09 PM      Component Value Range Comment   Glucose-Capillary 115 (*) 70 - 99 mg/dL   CBC WITH DIFFERENTIAL     Status: Abnormal   Collection Time   03/24/12  6:06 AM      Component Value Range Comment   WBC 11.8 (*) 4.0 - 10.5 K/uL WHITE COUNT CONFIRMED ON SMEAR   RBC 4.51  3.87 - 5.11 MIL/uL    Hemoglobin 14.0  12.0 - 15.0 g/dL    HCT 40.9  81.1 - 91.4 %    MCV 90.0  78.0 - 100.0 fL    MCH 31.0  26.0 - 34.0 pg    MCHC 34.5  30.0 - 36.0 g/dL    RDW 78.2  95.6 - 21.3 %    Platelets 360  150 - 400 K/uL    Neutrophils Relative 74  43 - 77 %    Lymphocytes Relative 17  12 - 46 %    Monocytes Relative 6  3 - 12 %    Eosinophils Relative 2  0 - 5 %    Basophils Relative 1  0 - 1 %    Neutro Abs 8.8 (*) 1.7 - 7.7 K/uL    Lymphs  Abs 2.0  0.7 - 4.0 K/uL    Monocytes Absolute 0.7  0.1 - 1.0 K/uL    Eosinophils Absolute 0.2  0.0 - 0.7 K/uL    Basophils Absolute 0.1  0.0 - 0.1 K/uL    Smear Review MORPHOLOGY UNREMARKABLE     COMPREHENSIVE METABOLIC PANEL     Status: Abnormal   Collection Time   03/24/12  6:06 AM      Component Value Range Comment   Sodium 136  135 - 145 mEq/L    Potassium 3.7  3.5 - 5.1 mEq/L    Chloride 100  96 - 112 mEq/L    CO2 25  19 - 32 mEq/L    Glucose, Bld 117 (*) 70 - 99 mg/dL    BUN 19  6 - 23 mg/dL    Creatinine, Ser 1.61  0.50 - 1.10 mg/dL    Calcium 8.9  8.4 - 09.6 mg/dL    Total Protein 6.1  6.0 - 8.3 g/dL    Albumin 3.0 (*) 3.5 - 5.2 g/dL    AST 16  0 - 37 U/L    ALT 8  0 - 35 U/L    Alkaline Phosphatase 61  39 - 117 U/L    Total Bilirubin 0.6   0.3 - 1.2 mg/dL    GFR calc non Af Amer 52 (*) >90 mL/min    GFR calc Af Amer 60 (*) >90 mL/min   GLUCOSE, CAPILLARY     Status: Abnormal   Collection Time   03/24/12  7:16 AM      Component Value Range Comment   Glucose-Capillary 108 (*) 70 - 99 mg/dL    Comment 1 Notify RN     GLUCOSE, CAPILLARY     Status: Abnormal   Collection Time   03/24/12 11:35 AM      Component Value Range Comment   Glucose-Capillary 138 (*) 70 - 99 mg/dL   GLUCOSE, CAPILLARY     Status: Abnormal   Collection Time   03/24/12  4:46 PM      Component Value Range Comment   Glucose-Capillary 122 (*) 70 - 99 mg/dL    Comment 1 Notify RN     GLUCOSE, CAPILLARY     Status: Abnormal   Collection Time   03/24/12  8:57 PM      Component Value Range Comment   Glucose-Capillary 206 (*) 70 - 99 mg/dL    Comment 1 Notify RN        HEENT: R lens opacification Cardio: RRR Resp: CTA B/L GI: BS positive Extremity:  No Edema Skin:   Intact Neuro: Alert/Oriented, Anxious, Cranial Nerve II-XII normal, Normal Sensory, Normal Motor and Other poor sitting balance leans to Right Musc/Skel:  Other Bilateral shoulder contracture   Assessment/Plan: 1. Functional deficits secondary to Thrombotic R cerebellar infarct which require 3+ hours per day of interdisciplinary therapy in a comprehensive inpatient rehab setting. Physiatrist is providing close team supervision and 24 hour management of active medical problems listed below. Physiatrist and rehab team continue to assess barriers to discharge/monitor patient progress toward functional and medical goals. FIM: FIM - Bathing Bathing: 0: Activity did not occur  FIM - Upper Body Dressing/Undressing Upper body dressing/undressing steps patient completed: Thread/unthread right sleeve of pullover shirt/dresss;Thread/unthread left sleeve of pullover shirt/dress;Put head through opening of pull over shirt/dress;Pull shirt over trunk;Thread/unthread right bra strap;Thread/unthread  left bra strap Upper body dressing/undressing: 4: Min-Patient completed 75 plus % of tasks FIM - Lower Body  Dressing/Undressing Lower body dressing/undressing steps patient completed: Pull underwear up/down;Pull pants up/down Lower body dressing/undressing: 2: Max-Patient completed 25-49% of tasks  FIM - Toileting Toileting steps completed by patient: Adjust clothing prior to toileting;Adjust clothing after toileting;Performs perineal hygiene Toileting: 4: Steadying assist  FIM - Diplomatic Services operational officer Devices: Grab bars Toilet Transfers: 3-To toilet/BSC: Mod A (lift or lower assist);3-From toilet/BSC: Mod A (lift or lower assist)  FIM - Bed/Chair Transfer Bed/Chair Transfer Assistive Devices: Arm rests;Bed rails Bed/Chair Transfer: 4: Supine > Sit: Min A (steadying Pt. > 75%/lift 1 leg);4: Sit > Supine: Min A (steadying pt. > 75%/lift 1 leg);2: Chair or W/C > Bed: Max A (lift and lower assist);2: Bed > Chair or W/C: Max A (lift and lower assist)  FIM - Locomotion: Wheelchair Distance: 30 Locomotion: Wheelchair: 1: Travels less than 50 ft with minimal assistance (Pt.>75%) FIM - Locomotion: Ambulation Ambulation/Gait Assistance: 2: Max assist Locomotion: Ambulation: 1: Travels less than 50 ft with maximal assistance (Pt: 25 - 49%)  Comprehension Comprehension Mode: Auditory Comprehension: 5-Understands basic 90% of the time/requires cueing < 10% of the time  Expression Expression Mode: Verbal Expression: 4-Expresses basic 75 - 89% of the time/requires cueing 10 - 24% of the time. Needs helper to occlude trach/needs to repeat words.  Social Interaction Social Interaction: 4-Interacts appropriately 75 - 89% of the time - Needs redirection for appropriate language or to initiate interaction.  Problem Solving Problem Solving: 3-Solves basic 50 - 74% of the time/requires cueing 25 - 49% of the time  Memory Memory: 4-Recognizes or recalls 75 - 89% of the  time/requires cueing 10 - 24% of the time  1. 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 DVT Prophylaxis/Anticoagulation: Subcutaneous heparin. Monitor platelet counts any signs of bleeding  3. Diabetes mellitus. Hemoglobin A1c 6.5. Glucophage 500 mg twice a day. Check blood sugars a.c. and at bedtime  4. Neuropsych: This patient is capable of making decisions on his/her own behalf.  5. Hypertension. Coreg 40 mg daily, lisinopril 40 mg daily. Monitor with increased activity. Secondary prevention education  6. Hyperlipidemia. Zocor  7. Blindness right eye  8. Leukocytosis. White blood cell count 15,500 03/21/2012. Urinalysis and chest x-ray negative. Patient is afebrile. Repeat CBC shows WBCs normalizing    LOS (Days) 2 A FACE TO FACE EVALUATION WAS PERFORMED  KIRSTEINS,ANDREW E 03/25/2012, 7:24 AM

## 2012-03-25 NOTE — Progress Notes (Signed)
Occupational Therapy Session Note  Patient Details  Name: Lynn Weeks MRN: [id number] Date of Birth: 05-22-1919  Today's Date: 03/25/2012 Time: 1100-1200 Time Calculation (min): 60 min  Short Term Goals: Week 1:  OT Short Term Goal 1 (Week 1): Pt will be able to don pants over feet with supervision. OT Short Term Goal 2 (Week 1): Pt will don socks and shoes with min assist. OT Short Term Goal 3 (Week 1): Pt will transfer to toilet with min assist. OT Short Term Goal 4 (Week 1): Pt will transfer to walkin shower tub bench with min assist. OT Short Term Goal 5 (Week 1): Pt will be able to stand at sink to brush teeth with min assist.  Skilled Therapeutic Interventions/Progress Updates: Pt scheduled for B/D but she declined bathing as she stated that her daughter assisted her with bathing in the shower last night.  Pt did work on LB dressing and was able to don all clothing with steady assist for balance.  Pt stood at sink with steady assist as she completed grooming tasks.  Pt then worked on a variety of mobility skills (sitting and standing balance, sit to stand) and UE arom while maintaining visual focus on a single object.  Pt reports that she is still dizzy, but it is much better than yesterday.     Therapy Documentation Precautions:  Precautions Precautions: Fall Precaution Comments: poor balance, leans to right Restrictions Weight Bearing Restrictions: No   Pain: Pain Assessment Pain Assessment: No/denies pain  See FIM for current functional status  Therapy/Group: Individual Therapy  SAGUIER,JULIA 03/25/2012, 12:20 PM

## 2012-03-25 NOTE — Patient Care Conference (Signed)
Inpatient RehabilitationTeam Conference Note Date: 03/24/2012   Time: 11:30 AM    Patient Name: Lynn Weeks      Medical Record Number: [id number]  Date of Birth: May 04, 1919 Sex: Female         Room/Bed: 4027/4027-01 Payor Info: Payor: MEDICARE  Plan: MEDICARE PART A AND B  Product Type: *No Product type*     Admitting Diagnosis: CVA  Admit Date/Time:  03/23/2012  6:38 PM Admission Comments: No comment available   Primary Diagnosis:  Stroke Principal Problem: Stroke  Patient Active Problem List   Diagnosis Date Noted  . Stroke 03/24/2012  . CVA (cerebral infarction) 03/21/2012  . Near syncope 03/20/2012  . Leukocytosis 03/20/2012  . CAD (coronary artery disease) 05/08/2011  . Complete heart block 05/08/2011  . Hypertensive heart disease without congestive heart failure 05/08/2011  . Hyperlipemia 05/08/2011  . Type 2 diabetes 05/08/2011  . Chronic kidney disease (CKD), stage III (moderate) 05/08/2011  . Hyponatremia 05/08/2011  . Murmur, cardiac 05/08/2011    Expected Discharge Date: Expected Discharge Date: 04/07/12  Team Members Present: Physician: Dr. Claudette Laws Social Worker Present: Dossie Der, LCSW Nurse Present: Laural Roes, RN PT Present: Vincente Liberty, PTA;Edman Circle, PT OT Present: Leonette Monarch, OT SLP Present: Fae Pippin, SLP Other (Discipline and Name): Charolette Child Coordinator     Current Status/Progress Goal Weekly Team Focus  Medical   truncal ataxia due to cerebellar stroke  improved balance  assess equipment needs as well as activity tolerance   Bowel/Bladder   continent bowel/bladder, lbm 8/25  remain continent  monitor patient for bm q2days   Swallow/Nutrition/ Hydration             ADL's             Mobility   mod-max A  supervision  balance   Communication             Safety/Cognition/ Behavioral Observations            Pain   complaints of pain in right shoulder  3 or less 1-10 scale  monitor pain and effects  of pain meds   Skin   skin tear r elbow c allevyn and steri's, scab l cheek  no new breakdown  monitor skin qshift and prn       *See Interdisciplinary Assessment and Plan and progress notes for long and short-term goals  Barriers to Discharge: pre-existing visual problems right eye    Possible Resolutions to Barriers:  incorporate compensatory strategies    Discharge Planning/Teaching Needs:    Family aware pt will require 24 hour supervision/care at discharge, working on a plan.     Team Discussion:  Pt being evaluated today. Goals for supervision level-family here. Possibly vestibular eval  Revisions to Treatment Plan:  None   Continued Need for Acute Rehabilitation Level of Care: The patient requires daily medical management by a physician with specialized training in physical medicine and rehabilitation for the following conditions: Daily direction of a multidisciplinary physical rehabilitation program to ensure safe treatment while eliciting the highest outcome that is of practical value to the patient.: Yes Daily medical management of patient stability for increased activity during participation in an intensive rehabilitation regime.: Yes Daily analysis of laboratory values and/or radiology reports with any subsequent need for medication adjustment of medical intervention for : Neurological problems  [name], [name] 03/26/2012, 11:52 AM

## 2012-03-25 NOTE — Consult Note (Signed)
NEUROCOGNITIVE TESTING - [facility]IDENTIAL [facility]   Ms. Luisana Lutzke is a 78 year old, right-handed, Caucasian woman who was seen for brief neuropsychological assessment to evaluate potential cognitive changes following stroke.  According to her medical record, Ms. Mapel was admitted on 03/20/12 with near syncope and dizziness.  Initial cranial CT did not demonstrate acute changes.  However, follow up cranial CT on 03/23/12 confirmed acute infarct in the right cerebellar hemisphere with no evidence of hemorrhage.  Her medical history also includes type II diabetes, hypertension, coronary artery disease with pacemaker and history of inner ear problems.    PROCEDURES: [3 units of 16109 on 03/24/12]  The following tests were performed during today's visit: Mini Mental Status Examination (MMSE-2, brief version), Repeatable Battery for the Assessment of Neuropsychological Status (RBANS, form A), Geriatric Anxiety Inventory, and the Geriatric Depression Scale (short form).  Test results are as follows:   MMSE-2 (brief) Raw Score = 8/16 Description = Profoundly Impaired   RBANS Indices Scaled Score Percentile Description  Immediate Memory  57 < 1 Profoundly Impaired  Visuospatial/Constructional 69 2 Profoundly Impaired  Language 57 < 1 Profoundly Impaired  Attention 49 < 1 Profoundly Impaired  Delayed Memory 52 < 1 Profoundly Impaired  Total Score 50 < 1 Profoundly Impaired    RBANS Subtests Raw Score Percentile Description  List Learning 9 < 1 Profoundly Impaired  Story Memory 7 2 Profoundly Impaired  Figure Copy 15 12 Below Average  Line Orientation 8 < 1 Profoundly Impaired  Picture Naming 7 2 Profoundly Impaired  Semantic Fluency 7 < 1 Profoundly Impaired  Digit Span 5 3 Impaired  Coding 9 < 1 Profoundly Impaired  List Recall 0 < 1 Profoundly Impaired  List Recognition 14 < 1 Profoundly Impaired  Story Recall 1 1 Profoundly Impaired  Figure recall 3 2  Profoundly Impaired    Geriatric Depression Scale (short form) Raw Score = 3/15 Description = WNL    Geriatric Anxiety Inventory Raw Score = 2/20 Description = WNL   Test results revealed prominent impairment in cognitive domains, almost without exception.  Her gross visuoconstruction ability was the only one that remained relatively intact.  All other performances were impaired.  Ms. Ausley demonstrated rapid forgetfulness of novel information, regardless of the presentation method.  Additionally, she was disoriented to several aspects of date and location.  Her attention span was severely limited and she was unable to successfully divide her attention.  Her language skills were impaired, both in ability to name objects and in verbal fluency.  Finally, her ability to make fine visual distinctions was impaired.  Behaviorally, she demonstrated difficulty hearing, but when the examiner spoke up, she followed directions and responded appropriately to prompts, indicating that hearing difficulties cannot likely account for cognitive deficits seen on testing.  Additionally, she was observed to be very concrete in her thought processes, as she could not comprehend certain concepts (e.g. agree vs. disagree).  She also had trouble reading simple words at times.    From an emotional standpoint, on self-report measures used to assess mood symptoms, Ms. Montroy responses were NOT suggestive of significant depression or anxiety at this time.    Ms. Scogin overall neurocognitive profile, with predominantly impaired scores as well as profoundly impaired mental status is consistent with a diagnosis of dementia.  However, the etiology of her deficits is not entirely clear.  While there are most certainly some effects from her stroke (e.g. slowed processing speed, visuospatial dysfunction, etc.), it is unlikely  that all of the deficits seen on testing are a direct result of her cerebellar stroke.  Other factors that  could be contributing include: fatigue and side effects from medications.  However, we also cannot definitively rule out the possibility of an underlying neurodegenerative disease process (e.g. Alzheimer's disease) that was present prior to her stroke, but has been significantly exacerbated in recent days because of medical issues.     Of note, Ms. Meharg family was concerned about these findings, as their experience has been that she is improving well since her stroke.  Her granddaughter expressed particular concern that informing Ms. Teehan of the severity of the deficits could send her into a depressive state.  However, it was explained that the results should be viewed as a baseline for tracking her cognitive progress as she recovers.  Her family understood and agreed to continued cognitive follow-up to further assist in differential diagnosis and treatment planning.    In light of these findings, the following recommendations are provided.    RECOMMENDATIONS:  Recommendations for treatment team:     Ms. Lemere demonstrated concrete thought processes.  As such, directions and information should be provided in a simple, straight forward manner, and the treatment team should avoid giving multiple instructions simultaneously.    Ms. Strain may also benefit from being provided with multiple trials to learn new skills given the noted memory inefficiencies.    Use of reminder notes posted on the wall for important information may also be beneficial.     To the extent possible, multitasking should be avoided.   Ms. Casavant requires more time than typical to process information. The treatment team may benefit from waiting for a verbal response to information before presenting additional information.    Performance will generally be best in a structured, routine, and familiar environment, as opposed to situations involving complex problems.    Complete a neuropsychological re-evaluation prior to  discharge.    Recommendations for discharge planning:    Complete a comprehensive neuropsychological evaluation as an outpatient in 6-12 months to assess for interval change.   Maintain engagement in mentally, physically and cognitively stimulating activities.    Strive to maintain a healthy lifestyle (e.g., proper diet and exercise) in order to promote physical, cognitive and emotional health.    Due to the nature and severity of the symptoms noted during this evaluation, it is recommended that Ms. Tague initially obtain constant care and supervision following this hospitalization. To this end, her granddaughter said that they have arranged for in-home assistance following discharge and she mentioned that she lives close by and will be helping with the transition.     Establishing a power of attorney is warranted.    Ms. Hernandez should refrain from driving at this time.    Leavy Cella, Psy.D.  Clinical Neuropsychologist

## 2012-03-25 NOTE — Progress Notes (Signed)
Social Work Patient ID: Lynn Weeks, female   DOB: [birth year], 78 y.o.   MRN: [id number] Met with pt, son and granddaughter who was in her room to inform team conference goals-supervision level and discharge 9/11. Granddaughter reports she will be with her along with her daughter who is a Lawyer.  All feel each day she makes progress.  Very Committed family who are willing to do whatever pt needs.  Continue to work on discharge and provide emotional support.

## 2012-03-26 ENCOUNTER — Inpatient Hospital Stay (HOSPITAL_COMMUNITY): Payer: Medicare Other | Admitting: *Deleted

## 2012-03-26 ENCOUNTER — Inpatient Hospital Stay (HOSPITAL_COMMUNITY): Payer: Medicare Other | Admitting: Occupational Therapy

## 2012-03-26 LAB — GLUCOSE, CAPILLARY
Glucose-Capillary: 131 mg/dL — ABNORMAL HIGH (ref 70–99)
Glucose-Capillary: 98 mg/dL (ref 70–99)

## 2012-03-26 NOTE — Progress Notes (Addendum)
Patient ID: Lynn Weeks, female   DOB: Nov 17, 1918, 78 y.o.   MRN: [id number] Lynn Weeks is a 78 y.o. right-handed female with history of type 2 diabetes mellitus, hypertension, coronary artery disease with pacemaker as well his history of inner ear problems. Patient lives alone and was independent prior to admission driving short distances. Admitted 03/20/2012 with near syncope and dizziness. Cranial CT scan with no acute changes. Echocardiogram pending. Cardiac panel was negative. Carotid Dopplers with 40-59% right ICA stenosis.Marland Kitchen MRI of the brain not done secondary to pacemaker. Noted admission white blood cell count of 19,200 as well as review old records showing white blood cell count 19,800 in October of 2012. Chest x-ray was negative for infiltrate and urinalysis study negative with latest white blood cell count 15,500 on 03/20/2012 and monitored. Placed on scheduled Antivert 25 mg twice a day for dizziness. Neurology services consulted suspect possible right cerebellar infarcts not seen on initial cranial CT scan. Placed on Plavix therapy  Subjective/Complaints: Feels good  Still leaning to R Review of Systems  Neurological: Positive for dizziness.  All other systems reviewed and are negative.    Objective: Vital Signs: Blood pressure 109/56, pulse 75, temperature 98.5 F (36.9 C), temperature source Oral, resp. rate 18, weight 47.7 kg (105 lb 2.6 oz), SpO2 96.00%. No results found. Results for orders placed during the hospital encounter of 03/23/12 (from the past 72 hour(s))  GLUCOSE, CAPILLARY     Status: Abnormal   Collection Time   03/23/12  9:09 PM      Component Value Range Comment   Glucose-Capillary 115 (*) 70 - 99 mg/dL   CBC WITH DIFFERENTIAL     Status: Abnormal   Collection Time   03/24/12  6:06 AM      Component Value Range Comment   WBC 11.8 (*) 4.0 - 10.5 K/uL WHITE COUNT CONFIRMED ON SMEAR   RBC 4.51  3.87 - 5.11 MIL/uL    Hemoglobin 14.0  12.0 - 15.0 g/dL    HCT  40.9  81.1 - 91.4 %    MCV 90.0  78.0 - 100.0 fL    MCH 31.0  26.0 - 34.0 pg    MCHC 34.5  30.0 - 36.0 g/dL    RDW 78.2  95.6 - 21.3 %    Platelets 360  150 - 400 K/uL    Neutrophils Relative 74  43 - 77 %    Lymphocytes Relative 17  12 - 46 %    Monocytes Relative 6  3 - 12 %    Eosinophils Relative 2  0 - 5 %    Basophils Relative 1  0 - 1 %    Neutro Abs 8.8 (*) 1.7 - 7.7 K/uL    Lymphs Abs 2.0  0.7 - 4.0 K/uL    Monocytes Absolute 0.7  0.1 - 1.0 K/uL    Eosinophils Absolute 0.2  0.0 - 0.7 K/uL    Basophils Absolute 0.1  0.0 - 0.1 K/uL    Smear Review MORPHOLOGY UNREMARKABLE     COMPREHENSIVE METABOLIC PANEL     Status: Abnormal   Collection Time   03/24/12  6:06 AM      Component Value Range Comment   Sodium 136  135 - 145 mEq/L    Potassium 3.7  3.5 - 5.1 mEq/L    Chloride 100  96 - 112 mEq/L    CO2 25  19 - 32 mEq/L    Glucose, Bld 117 (*) 70 -  99 mg/dL    BUN 19  6 - 23 mg/dL    Creatinine, Ser 1.61  0.50 - 1.10 mg/dL    Calcium 8.9  8.4 - 09.6 mg/dL    Total Protein 6.1  6.0 - 8.3 g/dL    Albumin 3.0 (*) 3.5 - 5.2 g/dL    AST 16  0 - 37 U/L    ALT 8  0 - 35 U/L    Alkaline Phosphatase 61  39 - 117 U/L    Total Bilirubin 0.6  0.3 - 1.2 mg/dL    GFR calc non Af Amer 52 (*) >90 mL/min    GFR calc Af Amer 60 (*) >90 mL/min   GLUCOSE, CAPILLARY     Status: Abnormal   Collection Time   03/24/12  7:16 AM      Component Value Range Comment   Glucose-Capillary 108 (*) 70 - 99 mg/dL    Comment 1 Notify RN     GLUCOSE, CAPILLARY     Status: Abnormal   Collection Time   03/24/12 11:35 AM      Component Value Range Comment   Glucose-Capillary 138 (*) 70 - 99 mg/dL   GLUCOSE, CAPILLARY     Status: Abnormal   Collection Time   03/24/12  4:46 PM      Component Value Range Comment   Glucose-Capillary 122 (*) 70 - 99 mg/dL    Comment 1 Notify RN     GLUCOSE, CAPILLARY     Status: Abnormal   Collection Time   03/24/12  8:57 PM      Component Value Range Comment    Glucose-Capillary 206 (*) 70 - 99 mg/dL    Comment 1 Notify RN     GLUCOSE, CAPILLARY     Status: Abnormal   Collection Time   03/25/12  7:37 AM      Component Value Range Comment   Glucose-Capillary 116 (*) 70 - 99 mg/dL    Comment 1 Notify RN     GLUCOSE, CAPILLARY     Status: Abnormal   Collection Time   03/25/12 12:04 PM      Component Value Range Comment   Glucose-Capillary 117 (*) 70 - 99 mg/dL    Comment 1 Notify RN     GLUCOSE, CAPILLARY     Status: Abnormal   Collection Time   03/25/12  4:37 PM      Component Value Range Comment   Glucose-Capillary 129 (*) 70 - 99 mg/dL   GLUCOSE, CAPILLARY     Status: Abnormal   Collection Time   03/25/12  9:33 PM      Component Value Range Comment   Glucose-Capillary 119 (*) 70 - 99 mg/dL      HEENT: R lens opacification Cardio: RRR Resp: CTA B/L GI: BS positive Extremity:  No Edema Skin:   Intact Neuro: Alert/Oriented, Anxious, Cranial Nerve II-XII normal, Normal Sensory, Normal Motor and Other poor sitting balance leans to Right Musc/Skel:  Other Bilateral shoulder contracture   Assessment/Plan: 1. Functional deficits secondary to Thrombotic R cerebellar infarct which require 3+ hours per day of interdisciplinary therapy in a comprehensive inpatient rehab setting. Physiatrist is providing close team supervision and 24 hour management of active medical problems listed below. Physiatrist and rehab team continue to assess barriers to discharge/monitor patient progress toward functional and medical goals. FIM: FIM - Bathing Bathing: 0: Activity did not occur (pt declined)  FIM - Upper Body Dressing/Undressing Upper body dressing/undressing steps patient  completed: Thread/unthread right sleeve of pullover shirt/dresss;Thread/unthread left sleeve of pullover shirt/dress;Put head through opening of pull over shirt/dress;Pull shirt over trunk;Thread/unthread right bra strap;Thread/unthread left bra strap Upper body dressing/undressing:  4: Min-Patient completed 75 plus % of tasks FIM - Lower Body Dressing/Undressing Lower body dressing/undressing steps patient completed: Thread/unthread right underwear leg;Thread/unthread left underwear leg;Pull underwear up/down;Thread/unthread right pants leg;Thread/unthread left pants leg;Pull pants up/down;Don/Doff left sock;Don/Doff right shoe;Fasten/unfasten right shoe;Fasten/unfasten left shoe;Don/Doff right sock;Don/Doff left shoe Lower body dressing/undressing: 4: Steadying Assist  FIM - Toileting Toileting steps completed by patient: Adjust clothing prior to toileting;Adjust clothing after toileting;Performs perineal hygiene Toileting: 4: Steadying assist  FIM - Diplomatic Services operational officer Devices: Grab bars Toilet Transfers: 3-To toilet/BSC: Mod A (lift or lower assist);3-From toilet/BSC: Mod A (lift or lower assist)  FIM - Bed/Chair Transfer Bed/Chair Transfer Assistive Devices: Arm rests;Bed rails Bed/Chair Transfer: 4: Supine > Sit: Min A (steadying Pt. > 75%/lift 1 leg);4: Sit > Supine: Min A (steadying pt. > 75%/lift 1 leg);2: Chair or W/C > Bed: Max A (lift and lower assist);2: Bed > Chair or W/C: Max A (lift and lower assist)  FIM - Locomotion: Wheelchair Distance: 30 Locomotion: Wheelchair: 1: Total Assistance/staff pushes wheelchair (Pt<25%) FIM - Locomotion: Ambulation Locomotion: Ambulation Assistive Devices: Designer, industrial/product Ambulation/Gait Assistance: 2: Max assist Locomotion: Ambulation: 2: Travels 50 - 149 ft with moderate assistance (Pt: 50 - 74%)  Comprehension Comprehension Mode: Auditory Comprehension: 5-Understands complex 90% of the time/Cues < 10% of the time  Expression Expression Mode: Verbal Expression: 4-Expresses basic 75 - 89% of the time/requires cueing 10 - 24% of the time. Needs helper to occlude trach/needs to repeat words.  Social Interaction Social Interaction: 4-Interacts appropriately 75 - 89% of the time - Needs  redirection for appropriate language or to initiate interaction.  Problem Solving Problem Solving: 3-Solves basic 50 - 74% of the time/requires cueing 25 - 49% of the time  Memory Memory: 4-Recognizes or recalls 75 - 89% of the time/requires cueing 10 - 24% of the time  1. 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 DVT Prophylaxis/Anticoagulation: Subcutaneous heparin. Monitor platelet counts any signs of bleeding  3. Diabetes mellitus. Hemoglobin A1c 6.5. Glucophage 500 mg twice a day. Check blood sugars a.c. and at bedtime  4. Neuropsych: This patient is capable of making decisions on his/her own behalf.  5. Hypertension. Coreg 40 mg daily, lisinopril 40 mg daily. Monitor with increased activity. Secondary prevention education  6. Hyperlipidemia. Zocor  7. Blindness right eye  8. Leukocytosis. White blood cell count 11.8 WBCs normalizing    LOS (Days) 3 A FACE TO FACE EVALUATION WAS PERFORMED  Mantaj Chamberlin E 03/26/2012, 7:37 AM

## 2012-03-26 NOTE — Progress Notes (Signed)
Physical Therapy Session Note  Patient Details  Name: Lynn Weeks MRN: [id number] Date of Birth: March 11, 1919  Today's Date: 03/26/2012 Time: 5621-3086 Time Calculation (min): 45 min  Short Term Goals: Week 1:  PT Short Term Goal 1 (Week 1): Pt will perform functional transfers with min A PT Short Term Goal 1 - Progress (Week 1): Progressing toward goal PT Short Term Goal 2 (Week 1): Pt will gait with min A 50' in controlled environment PT Short Term Goal 2 - Progress (Week 1): Progressing toward goal PT Short Term Goal 3 (Week 1): Pt will maintain dynamic standing balance during functional task with min A PT Short Term Goal 3 - Progress (Week 1): Progressing toward goal  Skilled Therapeutic Interventions/Progress Updates:    NMR to improved vestibular system, standing without UE support, eyes closed, foam, single leg stance, steps- pt able to stand on foam with eyes closed with only min A- significantly improved; head turns; pt lacks hip srtategy with perturbations to correct posterior LOB  Pt denies dizziness but reports feeling of unsteadiness BP 127/69 HR 88 with activity  Toileting- pt sat safely on toilet without R lean and performed hygeine but needs steady A to balance for clothing managment  Therapy Documentation Precautions:  Precautions Precautions: Fall Precaution Comments: poor balance, leans to right Restrictions Weight Bearing Restrictions: No   Pain:OA pain in R knee with steps, no intervention necessary   Mobility:pt doing well to recall reaching back with L hand to sit   Locomotion : Stairs / Management consultant Assistance: 4: Min assist Stairs Assistance Details (indicate cue type and reason): 5 x 3, first B rails, then both hands on either R or L, more unsteady with R rail step to pattern tends to lead with LLE up d/t OA in R knee Stair Management Technique: One rail Right;One rail Left;Two rails  Other Treatments: Treatments Therapeutic  Activity: toileting, see FIM, gait to/from bathroom min A Neuromuscular Facilitation: Limitations;Activity to increase lateral weight shifting (vestibular training to decrease R lean)  See FIM for current functional status  Therapy/Group: Individual Therapy  Michaelene Song 03/26/2012, 1:32 PM

## 2012-03-26 NOTE — Progress Notes (Signed)
Occupational Therapy Session Note  Patient Details  Name: Lynn Weeks MRN: [id number] Date of Birth: [birth year]  Today's Date: 03/26/2012 Time: 4098-1191 Time Calculation (min): 55 min  Short Term Goals: Week 1:  OT Short Term Goal 1 (Week 1): Pt will be able to don pants over feet with supervision. OT Short Term Goal 2 (Week 1): Pt will don socks and shoes with min assist. OT Short Term Goal 3 (Week 1): Pt will transfer to toilet with min assist. OT Short Term Goal 4 (Week 1): Pt will transfer to walkin shower tub bench with min assist. OT Short Term Goal 5 (Week 1): Pt will be able to stand at sink to brush teeth with min assist.  Skilled Therapeutic Interventions/Progress Updates:    Pt seen for ADL retraining with focus on dynamic sitting and standing balance and use of mirror for visual cues for midline postural control.  Pt refused bathing, but willing to complete dressing task at sit to stand level with focus on midline orientation as pt continues to demonstrate Rt lateral lean which increases in standing.  Pt setup assist-steady assist with dressing task.  Engaged in activity to increase motor control and lateral weight shifting in standing at mirror in therapy gym with focus on midline orientation with weight shifting with reaching in PNF pattern.  Pt reports leg length discrepancy with Rt leg shorter, passed this information along to PT who plans to further assess during next session.  Pt min-mod assist ambulation with RW approx 100 feet, requiring 1 rest break.  Pt with increased Rt lean when ambulating faster or turning.  Therapy Documentation Precautions:  Precautions Precautions: Fall Precaution Comments: poor balance, leans to right Restrictions Weight Bearing Restrictions: No Pain:   Pt with no c/o pain this session.  See FIM for current functional status  Therapy/Group: Individual Therapy  Leonette Monarch 03/26/2012, 12:17 PM

## 2012-03-26 NOTE — Progress Notes (Signed)
Occupational Therapy Note  Patient Details  Name: Lynn Weeks MRN: [id number] Date of Birth: [birth year] Today's Date: 03/26/2012  Time:  1345-1415  ( ) Individual Therapy Pain:  None  Engaged in therapeutic activity at standing level using RUE.  Pt. Able to stand for 15 minutes with supervision.  She utilized RUE in functional activity of checkers with no LOB.  Is currently using RUE as active assist dominant level.  She maintained good postural control with minimal cues not to lean against things behind her or in front of her.  She reported feeling fatigued after standing.  Humberto Seals 03/26/2012, 3:14 PM

## 2012-03-26 NOTE — Progress Notes (Signed)
Physical Therapy Session Note  Patient Details  Name: ANGELES ZEHNER MRN: [id number] Date of Birth: [birth year]  Today's Date: 03/26/2012 Time:  -     Short Term Goals: Week 1:  PT Short Term Goal 1 (Week 1): Pt will perform functional transfers with min A PT Short Term Goal 1 - Progress (Week 1): Progressing toward goal PT Short Term Goal 2 (Week 1): Pt will gait with min A 50' in controlled environment PT Short Term Goal 2 - Progress (Week 1): Progressing toward goal PT Short Term Goal 3 (Week 1): Pt will maintain dynamic standing balance during functional task with min A PT Short Term Goal 3 - Progress (Week 1): Progressing toward goal  Skilled Therapeutic Interventions/Progress Updates:    Min A supine to sit thru sidelying R; cues to align self with PT in front to get initial midline sitting posture. NMR- addressed postural control:stood at sink to brush teeth with min steady A, pt slightly leaning on sink for support and used mirror for midline orientation.  Stood with L hand on Rw and croassed midline with RUE to grab and throw rings to increase L weight shift with min steady A and follwed each trial with gait using white sticker on RW or green line on floor to aid midline. Pt attempting to self correct with fewer cues and had fewer LOB with each trial.  Pt cued to reach back with L hand for stand to sit to increase lean to L as she often falls to R with stand to sit.  Pt with decreased comprehension of CVA despite education, she keeps stating "if I just had an elastic stocking on that R leg I'd be better" Therapy Documentation Precautions:  Precautions Precautions: Fall Precaution Comments: poor balance, leans to right Restrictions Weight Bearing Restrictions: No    Vital Signs:HR 88 with activity  BP 90/60 per RN and BP meds held Pain:  none    Locomotion : Ambulation Ambulation/Gait Assistance: 3: Mod assist Ambulation Distance (Feet): 100 Feet Assistive  device: Rolling walker (6 LOB to R in first  100 ft, fewer cues to self correct and fewer LOB with each trial) x 4 Trunk/Postural Assessment :   initially lean R in sitting EOB and in w/c Other Treatments: Treatments Neuromuscular Facilitation: Activity to increase motor control;Activity to increase lateral weight shifting;Limitations Neuromuscular Facilitation Limitations: falls to R  See FIM for current functional status  Therapy/Group: Individual Therapy  Michaelene Song 03/26/2012, 8:22 AM

## 2012-03-27 ENCOUNTER — Inpatient Hospital Stay (HOSPITAL_COMMUNITY): Payer: Medicare Other | Admitting: Occupational Therapy

## 2012-03-27 LAB — GLUCOSE, CAPILLARY
Glucose-Capillary: 108 mg/dL — ABNORMAL HIGH (ref 70–99)
Glucose-Capillary: 97 mg/dL (ref 70–99)

## 2012-03-27 NOTE — Progress Notes (Signed)
Occupational Therapy Session Note  Patient Details  Name: Lynn Weeks MRN: [id number] Date of Birth: 08-Jun-1919  Today's Date: 03/27/2012 Time: 5621-3086 Time Calculation (min): 45 min  Skilled Therapeutic Interventions/Progress Updates: ADL in w/c at sink with focus on dynamic standing balance.  Patient one slight LOB while peri/buttock batheing and was able to right herself.  Otherwise, patient able to maintain balance and lean forward in chair to tie shoes.    Therapy Documentation Precautions:  Precautions Precautions: Fall Precaution Comments: poor balance, leans to right Restrictions Weight Bearing Restrictions: No Pain: denied   See FIM for current functional status  Therapy/Group: Individual Therapy  Bud Face [facility] 03/27/2012, 7:52 PM

## 2012-03-27 NOTE — Progress Notes (Signed)
Patient ID: Lynn Weeks, female   DOB: 18-Jan-1919, 78 y.o.   MRN: [id number] Patient ID: Lynn Weeks, female   DOB: 1918/10/19, 78 y.o.   MRN: [id number]  88/31.  Alert appropriate no complaints. Uneventful night.  CBG (last 3)   Basename 03/27/12 0733 03/26/12 2042 03/26/12 1646  GLUCAP 108* 135* 98    ENT - right lens opacified with external rotation. Chest clear anterolaterally. Cardiovascular normal S1-S2 no murmurs. Abdomen soft flat nontender. Extremities no edema   BP Readings from Last 3 Encounters:  03/27/12 93/52  03/23/12 115/54    Lynn Weeks is a 78 y.o. right-handed female with history of type 2 diabetes mellitus, hypertension, coronary artery disease with pacemaker as well his history of inner ear problems. Patient lives alone and was independent prior to admission driving short distances. Admitted 03/20/2012 with near syncope and dizziness. Cranial CT scan with no acute changes. Echocardiogram pending. Cardiac panel was negative. Carotid Dopplers with 40-59% right ICA stenosis.Marland Kitchen MRI of the brain not done secondary to pacemaker. Noted admission white blood cell count of 19,200 as well as review old records showing white blood cell count 19,800 in October of 2012. Chest x-ray was negative for infiltrate and urinalysis study negative with latest white blood cell count 15,500 on 03/20/2012 and monitored. Placed on scheduled Antivert 25 mg twice a day for dizziness. Neurology services consulted suspect possible right cerebellar infarcts not seen on initial cranial CT scan. Placed on Plavix therapy  Subjective/Complaints: Feels good  Still leaning to R Review of Systems  Neurological: Positive for dizziness.  All other systems reviewed and are negative.    Objective: Vital Signs: Blood pressure 93/52, pulse 69, temperature 97.6 F (36.4 C), temperature source Oral, resp. rate 18, weight 47.7 kg (105 lb 2.6 oz), SpO2 99.00%. No results found. Results for orders placed  during the hospital encounter of 03/23/12 (from the past 72 hour(s))  GLUCOSE, CAPILLARY     Status: Abnormal   Collection Time   03/24/12 11:35 AM      Component Value Range Comment   Glucose-Capillary 138 (*) 70 - 99 mg/dL   GLUCOSE, CAPILLARY     Status: Abnormal   Collection Time   03/24/12  4:46 PM      Component Value Range Comment   Glucose-Capillary 122 (*) 70 - 99 mg/dL    Comment 1 Notify RN     GLUCOSE, CAPILLARY     Status: Abnormal   Collection Time   03/24/12  8:57 PM      Component Value Range Comment   Glucose-Capillary 206 (*) 70 - 99 mg/dL    Comment 1 Notify RN     GLUCOSE, CAPILLARY     Status: Abnormal   Collection Time   03/25/12  7:37 AM      Component Value Range Comment   Glucose-Capillary 116 (*) 70 - 99 mg/dL    Comment 1 Notify RN     GLUCOSE, CAPILLARY     Status: Abnormal   Collection Time   03/25/12 12:04 PM      Component Value Range Comment   Glucose-Capillary 117 (*) 70 - 99 mg/dL    Comment 1 Notify RN     GLUCOSE, CAPILLARY     Status: Abnormal   Collection Time   03/25/12  4:37 PM      Component Value Range Comment   Glucose-Capillary 129 (*) 70 - 99 mg/dL   GLUCOSE, CAPILLARY     Status: Abnormal  Collection Time   03/25/12  9:33 PM      Component Value Range Comment   Glucose-Capillary 119 (*) 70 - 99 mg/dL   GLUCOSE, CAPILLARY     Status: Abnormal   Collection Time   03/26/12  7:17 AM      Component Value Range Comment   Glucose-Capillary 132 (*) 70 - 99 mg/dL    Comment 1 Notify RN     GLUCOSE, CAPILLARY     Status: Abnormal   Collection Time   03/26/12 11:46 AM      Component Value Range Comment   Glucose-Capillary 131 (*) 70 - 99 mg/dL    Comment 1 Notify RN     GLUCOSE, CAPILLARY     Status: Normal   Collection Time   03/26/12  4:46 PM      Component Value Range Comment   Glucose-Capillary 98  70 - 99 mg/dL   GLUCOSE, CAPILLARY     Status: Abnormal   Collection Time   03/26/12  8:42 PM      Component Value Range Comment    Glucose-Capillary 135 (*) 70 - 99 mg/dL    Comment 1 Notify RN     GLUCOSE, CAPILLARY     Status: Abnormal   Collection Time   03/27/12  7:33 AM      Component Value Range Comment   Glucose-Capillary 108 (*) 70 - 99 mg/dL    Comment 1 Notify RN        HEENT: R lens opacification Cardio: RRR Resp: CTA B/Lynn GI: BS positive Extremity:  No Edema Skin:   Intact Neuro: Alert/Oriented, Anxious, Cranial Nerve II-XII normal, Normal Sensory, Normal Motor and Other poor sitting balance leans to Right Musc/Skel:  Other Bilateral shoulder contracture   Assessment/Plan: 1. Functional deficits secondary to Thrombotic R cerebellar infarct which require 3+ hours per day of interdisciplinary therapy in a comprehensive inpatient rehab setting. Physiatrist is providing close team supervision and 24 hour management of active medical problems listed below. Physiatrist and rehab team continue to assess barriers to discharge/monitor patient progress toward functional and medical goals. FIM: FIM - Bathing Bathing: 0: Activity did not occur (pt declined)  FIM - Upper Body Dressing/Undressing Upper body dressing/undressing steps patient completed: Thread/unthread right bra strap;Thread/unthread left bra strap;Thread/unthread right sleeve of pullover shirt/dresss;Thread/unthread left sleeve of pullover shirt/dress;Put head through opening of pull over shirt/dress;Pull shirt over trunk Upper body dressing/undressing: 4: Min-Patient completed 75 plus % of tasks FIM - Lower Body Dressing/Undressing Lower body dressing/undressing steps patient completed: Thread/unthread right underwear leg;Thread/unthread left underwear leg;Pull underwear up/down;Thread/unthread right pants leg;Thread/unthread left pants leg;Pull pants up/down;Don/Doff right sock;Don/Doff left sock;Don/Doff right shoe;Don/Doff left shoe;Fasten/unfasten right shoe;Fasten/unfasten left shoe Lower body dressing/undressing: 4: Steadying Assist  FIM -  Toileting Toileting steps completed by patient: Adjust clothing prior to toileting;Adjust clothing after toileting Toileting: 4: Steadying assist  FIM - Diplomatic Services operational officer Devices: Art gallery manager Transfers: 4-To toilet/BSC: Min A (steadying Pt. > 75%);4-From toilet/BSC: Min A (steadying Pt. > 75%)  FIM - Bed/Chair Transfer Bed/Chair Transfer Assistive Devices: Bed rails;Arm rests Bed/Chair Transfer: 4: Supine > Sit: Min A (steadying Pt. > 75%/lift 1 leg);4: Bed > Chair or W/C: Min A (steadying Pt. > 75%)  FIM - Locomotion: Wheelchair Distance: 30 Locomotion: Wheelchair: 1: Total Assistance/staff pushes wheelchair (Pt<25%) FIM - Locomotion: Ambulation Locomotion: Ambulation Assistive Devices: Designer, industrial/product Ambulation/Gait Assistance: 3: Mod assist Locomotion: Ambulation: 2: Travels 50 - 149 ft with moderate assistance (Pt: 50 -  74%)  Comprehension Comprehension Mode: Auditory Comprehension: 5-Understands complex 90% of the time/Cues < 10% of the time  Expression Expression Mode: Verbal Expression: 4-Expresses basic 75 - 89% of the time/requires cueing 10 - 24% of the time. Needs helper to occlude trach/needs to repeat words.  Social Interaction Social Interaction: 6-Interacts appropriately with others with medication or extra time (anti-anxiety, antidepressant).  Problem Solving Problem Solving: 3-Solves basic 50 - 74% of the time/requires cueing 25 - 49% of the time  Memory Memory: 4-Recognizes or recalls 75 - 89% of the time/requires cueing 10 - 24% of the time  1. 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 DVT Prophylaxis/Anticoagulation: Subcutaneous heparin. Monitor platelet counts any signs of bleeding  3. Diabetes mellitus. Hemoglobin A1c 6.5. Glucophage 500 mg twice a day. Check blood sugars a.c. and at bedtime  4. Neuropsych: This patient is capable of making decisions on his/her own behalf.    5. Hypertension. Coreg 40 mg daily, lisinopril 40 mg daily. Monitor with increased activity. Secondary prevention education  6. Hyperlipidemia. Zocor  7. Blindness right eye  8. Leukocytosis. White blood cell count 11.8 WBCs normalizing    LOS (Days) 4 A FACE TO FACE EVALUATION WAS PERFORMED  Rogelia Boga 03/27/2012, 8:57 AM

## 2012-03-28 ENCOUNTER — Inpatient Hospital Stay (HOSPITAL_COMMUNITY): Payer: Medicare Other | Admitting: Occupational Therapy

## 2012-03-28 ENCOUNTER — Inpatient Hospital Stay (HOSPITAL_COMMUNITY): Payer: Medicare Other | Admitting: Physical Therapy

## 2012-03-28 LAB — GLUCOSE, CAPILLARY
Glucose-Capillary: 102 mg/dL — ABNORMAL HIGH (ref 70–99)
Glucose-Capillary: 130 mg/dL — ABNORMAL HIGH (ref 70–99)
Glucose-Capillary: 115 mg/dL — ABNORMAL HIGH (ref 70–99)

## 2012-03-28 NOTE — Progress Notes (Signed)
Occupational Therapy Session Note  Patient Details  Name: Lynn Weeks MRN: [id number] Date of Birth: August 04, 1918  Today's Date: 03/28/2012 Time: 1300-1345 and 4098-119 Time Calculation (min): 45 min +45 min = 90 total  Skilled Therapeutic Interventions/Progress Updates: ADL sit to stand  And standing balance at sink to address balance and dizziness concerns affecting self care, IADLs and mobility.  Patient with no losses of balance standing today for ADLs.     Therapy Documentation Precautions:  Precautions Precautions: Fall Precaution Comments: poor balance, leans to right Restrictions Weight Bearing Restrictions: No   Pain:left ("arthritic shoulder") but patient did not rate.  RN gave medication Pain Assessment Pain Assessment: 0-10 Pain Score:   6 Pain Type: Acute pain Pain Location: Shoulder Pain Orientation: Left Pain Frequency: Intermittent Patients Stated Pain Goal: 0 Pain Intervention(s): Medication (See eMAR)  See FIM for current functional status  Therapy/Group: Individual Therapy  Bud Face [facility] 03/28/2012, 4:55 PM

## 2012-03-28 NOTE — Progress Notes (Signed)
Patient ID: Lynn Weeks, female   DOB: 02/24/1919, 78 y.o.   MRN: [id number] Patient ID: Lynn Weeks, female   DOB: 07/12/1919, 78 y.o.   MRN: [id number] Patient ID: Lynn Weeks, female   DOB: Dec 20, 1918, 78 y.o.   MRN: [id number]  9/1. Alert appropriate no complaints. Uneventful night.  Feeding self  breakfast  CBG (last 3)   Basename 03/28/12 0724 03/27/12 2116 03/27/12 1655  GLUCAP 102* 92 97    ENT - right lens opacified with external rotation. Chest clear anterolaterally. Cardiovascular normal S1-S2 no murmurs. Abdomen soft flat nontender. Extremities no edema   BP Readings from Last 3 Encounters:  03/28/12 133/68  03/23/12 115/54    Lynn Weeks is a 78 y.o. right-handed female with history of type 2 diabetes mellitus, hypertension, coronary artery disease with pacemaker as well his history of inner ear problems. Patient lives alone and was independent prior to admission driving short distances. Admitted 03/20/2012 with near syncope and dizziness. Cranial CT scan with no acute changes. Echocardiogram pending. Cardiac panel was negative. Carotid Dopplers with 40-59% right ICA stenosis.Marland Kitchen MRI of the brain not done secondary to pacemaker. Noted admission white blood cell count of 19,200 as well as review old records showing white blood cell count 19,800 in October of 2012. Chest x-ray was negative for infiltrate and urinalysis study negative with latest white blood cell count 15,500 on 03/20/2012 and monitored. Placed on scheduled Antivert 25 mg twice a day for dizziness. Neurology services consulted suspect possible right cerebellar infarcts not seen on initial cranial CT scan. Placed on Plavix therapy  Subjective/Complaints: Feels good  Still leaning to R Review of Systems  Neurological: Positive for dizziness.  All other systems reviewed and are negative.    Objective: Vital Signs: Blood pressure 133/68, pulse 68, temperature 98.2 F (36.8 C), temperature source Oral,  resp. rate 16, weight 47.7 kg (105 lb 2.6 oz), SpO2 95.00%. No results found. Results for orders placed during the hospital encounter of 03/23/12 (from the past 72 hour(s))  GLUCOSE, CAPILLARY     Status: Abnormal   Collection Time   03/25/12 12:04 PM      Component Value Range Comment   Glucose-Capillary 117 (*) 70 - 99 mg/dL    Comment 1 Notify RN     GLUCOSE, CAPILLARY     Status: Abnormal   Collection Time   03/25/12  4:37 PM      Component Value Range Comment   Glucose-Capillary 129 (*) 70 - 99 mg/dL   GLUCOSE, CAPILLARY     Status: Abnormal   Collection Time   03/25/12  9:33 PM      Component Value Range Comment   Glucose-Capillary 119 (*) 70 - 99 mg/dL   GLUCOSE, CAPILLARY     Status: Abnormal   Collection Time   03/26/12  7:17 AM      Component Value Range Comment   Glucose-Capillary 132 (*) 70 - 99 mg/dL    Comment 1 Notify RN     GLUCOSE, CAPILLARY     Status: Abnormal   Collection Time   03/26/12 11:46 AM      Component Value Range Comment   Glucose-Capillary 131 (*) 70 - 99 mg/dL    Comment 1 Notify RN     GLUCOSE, CAPILLARY     Status: Normal   Collection Time   03/26/12  4:46 PM      Component Value Range Comment   Glucose-Capillary 98  70 - 99  mg/dL   GLUCOSE, CAPILLARY     Status: Abnormal   Collection Time   03/26/12  8:42 PM      Component Value Range Comment   Glucose-Capillary 135 (*) 70 - 99 mg/dL    Comment 1 Notify RN     GLUCOSE, CAPILLARY     Status: Abnormal   Collection Time   03/27/12  7:33 AM      Component Value Range Comment   Glucose-Capillary 108 (*) 70 - 99 mg/dL    Comment 1 Notify RN     GLUCOSE, CAPILLARY     Status: Abnormal   Collection Time   03/27/12 12:20 PM      Component Value Range Comment   Glucose-Capillary 125 (*) 70 - 99 mg/dL    Comment 1 Notify RN     GLUCOSE, CAPILLARY     Status: Normal   Collection Time   03/27/12  4:55 PM      Component Value Range Comment   Glucose-Capillary 97  70 - 99 mg/dL   GLUCOSE, CAPILLARY      Status: Normal   Collection Time   03/27/12  9:16 PM      Component Value Range Comment   Glucose-Capillary 92  70 - 99 mg/dL   GLUCOSE, CAPILLARY     Status: Abnormal   Collection Time   03/28/12  7:24 AM      Component Value Range Comment   Glucose-Capillary 102 (*) 70 - 99 mg/dL    Comment 1 Notify RN        HEENT: R lens opacification Cardio: RRR Resp: CTA B/Lynn GI: BS positive Extremity:  No Edema Skin:   Intact Neuro: Alert/Oriented, Anxious, Cranial Nerve II-XII normal, Normal Sensory, Normal Motor and Other poor sitting balance leans to Right Musc/Skel:  Other Bilateral shoulder contracture   Assessment/Plan: 1. Functional deficits secondary to Thrombotic R cerebellar infarct which require 3+ hours per day of interdisciplinary therapy in a comprehensive inpatient rehab setting. Physiatrist is providing close team supervision and 24 hour management of active medical problems listed below. Physiatrist and rehab team continue to assess barriers to discharge/monitor patient progress toward functional and medical goals. FIM: FIM - Bathing Bathing Steps Patient Completed: Chest;Right Arm;Left Arm;Abdomen;Front perineal area;Buttocks;Right upper leg;Left upper leg;Right lower leg (including foot);Left lower leg (including foot) Bathing: 4: Min-Patient completes 8-9 71f 10 parts or 75+ percent  FIM - Upper Body Dressing/Undressing Upper body dressing/undressing steps patient completed: Thread/unthread right bra strap;Thread/unthread left bra strap;Hook/unhook bra;Thread/unthread left sleeve of pullover shirt/dress;Put head through opening of pull over shirt/dress;Pull shirt over trunk;Thread/unthread right sleeve of pullover shirt/dresss Upper body dressing/undressing: 5: Supervision: Safety issues/verbal cues FIM - Lower Body Dressing/Undressing Lower body dressing/undressing steps patient completed: Thread/unthread right underwear leg;Thread/unthread left pants leg;Fasten/unfasten  right shoe;Thread/unthread left underwear leg;Pull pants up/down;Fasten/unfasten left shoe;Fasten/unfasten pants;Pull underwear up/down;Don/Doff right sock;Thread/unthread right pants leg;Don/Doff left sock;Don/Doff right shoe;Don/Doff left shoe Lower body dressing/undressing: 5: Set-up assist to: Obtain clothing  FIM - Toileting Toileting steps completed by patient: Adjust clothing prior to toileting;Adjust clothing after toileting Toileting: 0: Activity did not occur  FIM - Diplomatic Services operational officer Devices: Art gallery manager Transfers: 0-Activity did not occur  FIM - Banker Devices: Bed rails;Arm rests Bed/Chair Transfer: 6: Supine > Sit: No assist;5: Bed > Chair or W/C: Supervision (verbal cues/safety issues)  FIM - Locomotion: Wheelchair Distance: 30 Locomotion: Wheelchair: 1: Total Assistance/staff pushes wheelchair (Pt<25%) FIM - Locomotion: Ambulation Locomotion: Ambulation Assistive  Devices: Walker - Rolling Ambulation/Gait Assistance: 3: Mod assist Locomotion: Ambulation: 2: Travels 50 - 149 ft with moderate assistance (Pt: 50 - 74%)  Comprehension Comprehension Mode: Auditory Comprehension: 6-Follows complex conversation/direction: With extra time/assistive device  Expression Expression Mode: Verbal Expression: 6-Expresses complex ideas: With extra time/assistive device  Social Interaction Social Interaction: 6-Interacts appropriately with others with medication or extra time (anti-anxiety, antidepressant).  Problem Solving Problem Solving: 6-Solves complex problems: With extra time  Memory Memory: 6-More than reasonable amt of time  1. 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 DVT Prophylaxis/Anticoagulation: Subcutaneous heparin. Monitor platelet counts any signs of bleeding  3. Diabetes mellitus. Hemoglobin A1c 6.5. Glucophage 500 mg twice a day. Check blood  sugars a.c. and at bedtime  4. Neuropsych: This patient is capable of making decisions on his/her own behalf.  5. Hypertension. Coreg 40 mg daily, lisinopril 40 mg daily. Monitor with increased activity. Secondary prevention education  6. Hyperlipidemia. Zocor  7. Blindness right eye  8. Leukocytosis. White blood cell count 11.8 WBCs normalizing    LOS (Days) 5 A FACE TO FACE EVALUATION WAS PERFORMED  Lynn Weeks 03/28/2012, 8:47 AM

## 2012-03-28 NOTE — Progress Notes (Signed)
Physical Therapy Note  Patient Details  Name: ANIKAH HOGGE MRN: [id number] Date of Birth: 01-28-1919 Today's Date: 03/28/2012  1000-1055 (55 minutes) individual Pain: no complaint of pain Focus of treatment: Gait training/ neuro re-ed focused on RT LE control/decrease lean to right. Treatment: Gait 60 feet X1 RW min assist with decrease BOS/adducted RT LE in stance; gait forward/backward using RW min assist  with bolsters between feet to facilitate hip width BOS X 3; gait with RW with vcs to adduct RT LE during gait min assist 50 feet with decreased lean to right; Up/down 4 inch step sideways in parallel bars 3 X 10 for glut medius strengthening/control.  4098-1191 (40 minutes) individual Pain: no complaint of pain Focus of treatment: gait training focusing on weight shifts to left during step on right Treatment: Tandem stance in parallel bars focused on maintaining midline stance without UE assist; gait forward/backward in parallel bars maintaining hip width BOS with increased step length backward to facilitate protective balance reactions; gait with RW- 80 feet min assist with loss of balance to right X 3 when turning and improved BOS.   Gor Vestal,JIM 03/28/2012, 8:00 AM

## 2012-03-29 ENCOUNTER — Inpatient Hospital Stay (HOSPITAL_COMMUNITY): Payer: Medicare Other | Admitting: Physical Therapy

## 2012-03-29 ENCOUNTER — Inpatient Hospital Stay (HOSPITAL_COMMUNITY): Payer: Medicare Other | Admitting: Occupational Therapy

## 2012-03-29 DIAGNOSIS — I69993 Ataxia following unspecified cerebrovascular disease: Secondary | ICD-10-CM

## 2012-03-29 DIAGNOSIS — I6330 Cerebral infarction due to thrombosis of unspecified cerebral artery: Secondary | ICD-10-CM

## 2012-03-29 DIAGNOSIS — Z5189 Encounter for other specified aftercare: Secondary | ICD-10-CM

## 2012-03-29 LAB — GLUCOSE, CAPILLARY
Glucose-Capillary: 131 mg/dL — ABNORMAL HIGH (ref 70–99)
Glucose-Capillary: 134 mg/dL — ABNORMAL HIGH (ref 70–99)

## 2012-03-29 MED ORDER — SENNOSIDES-DOCUSATE SODIUM 8.6-50 MG PO TABS
2.0000 | ORAL_TABLET | Freq: Every day | ORAL | Status: DC
  Administered 2012-03-30 – 2012-04-05 (×6): 2 via ORAL
  Filled 2012-03-29 (×9): qty 2

## 2012-03-29 NOTE — Progress Notes (Signed)
Occupational Therapy Session Note  Patient Details  Name: Lynn Weeks MRN: [id number] Date of Birth: 1918/10/18  Today's Date: 03/29/2012 Time: 1015-1100 Time Calculation (min): 45 min  Short Term Goals: Week 1:  OT Short Term Goal 1 (Week 1): Pt will be able to don pants over feet with supervision. OT Short Term Goal 2 (Week 1): Pt will don socks and shoes with min assist. OT Short Term Goal 3 (Week 1): Pt will transfer to toilet with min assist. OT Short Term Goal 4 (Week 1): Pt will transfer to walkin shower tub bench with min assist. OT Short Term Goal 5 (Week 1): Pt will be able to stand at sink to brush teeth with min assist.  Skilled Therapeutic Interventions/Progress Updates:    Pt worked on tub transfers using grab bar to step over the side of the tub with min assist to steady her balance.  Therapy continued with therapeutic activities focusing on gaze stabilization, sit to stand, standing balance to increase movement in midline versus leaning to the right.   Therapy Documentation Precautions:  Precautions Precautions: Fall Precaution Comments: poor balance, leans to right Restrictions Weight Bearing Restrictions: No   Pain: Pain Assessment Pain Assessment: No/denies pain Pain Score: 0-No pain ADL: See FIM for current functional status  Therapy/Group: Individual Therapy  [name] 03/29/2012, 12:18 PM

## 2012-03-29 NOTE — Progress Notes (Signed)
Physical Therapy Note  Patient Details  Name: Lynn Weeks MRN: [id number] Date of Birth: 1919/04/26 Today's Date: 03/29/2012     # 1  4098-1191 (55 minutes) individual  Pain: no initial c/o pain /c/o pain with activity (headache) Burnett Kanaris notified/ meds given Focus of treatment: Gait training with RW; therapeutic activities to facilitate standing protective balance reactions/  Static and dynamic standing balance;  wc mobility training Treatment: wc mobility 120 feet room > gym SBA with increased time and 2 rest breaks; transfers- pt instructed in partial stand wc>< mat transfers with 2 point UE support; pt transferred X 4 with mod vcs for hand placement and one posterior loss of balance; standing - sit to stand with max vcs for hand placement vs pulling on RW; standing - pt with initial posterior loss of balance sit to stand from mat with no stepping protective balance reaction; practiced stepping strategy, pt unable to perform adequate backward step length to prevent loss os balance; standing with min/mod challenges at hips/trunk; standing performing reaching activities with loss of balance to right; gait 30 feet RW min assist for balance with vcs to abduct RT LE during swing to prevent decreased BOS with uncorrected loss of balance to right when turning.  #2 1330- 1455 (85 minutes) individual Pain: no complaint of pain Focus of treatment: therapeutic activities to decrease loss of balance to right and facilitate protective balance reactions. Treatment: standing with Unilateral UE support stepping to targets on floor forward.sideways, backwards min assist with partial loss of balance to right; tandem stance with right foot forward /back with unilateral support / no UE support.; gait with SW min to close SBA to decrease gait speed to allow pt to regain standing balance/ pt required vcs for sequencing and maintain wider BOS (70 feet X 6 with seated rest breaks)   [name],JIM 03/29/2012, 7:53 AM

## 2012-03-29 NOTE — Progress Notes (Signed)
Patient ID: Lynn Weeks, female   DOB: Dec 17, 1918, 78 y.o.   MRN: [id number] Lynn Weeks is a 78 y.o. right-handed female with history of type 2 diabetes mellitus, hypertension, coronary artery disease with pacemaker as well his history of inner ear problems. Patient lives alone and was independent prior to admission driving short distances. Admitted 03/20/2012 with near syncope and dizziness. Cranial CT scan with no acute changes. Echocardiogram pending. Cardiac panel was negative. Carotid Dopplers with 40-59% right ICA stenosis.Marland Kitchen MRI of the brain not done secondary to pacemaker. Noted admission white blood cell count of 19,200 as well as review old records showing white blood cell count 19,800 in October of 2012. Chest x-ray was negative for infiltrate and urinalysis study negative with latest white blood cell count 15,500 on 03/20/2012 and monitored. Placed on scheduled Antivert 25 mg twice a day for dizziness. Neurology services consulted suspect possible right cerebellar infarcts not seen on initial cranial CT scan. Placed on Plavix therapy  Subjective/Complaints: Feels good  Still leaning to R Review of Systems  Neurological: Positive for dizziness.  All other systems reviewed and are negative.    Objective: Vital Signs: Blood pressure 137/61, pulse 71, temperature 98.1 F (36.7 C), temperature source Oral, resp. rate 18, weight 47.7 kg (105 lb 2.6 oz), SpO2 94.00%. No results found. Results for orders placed during the hospital encounter of 03/23/12 (from the past 72 hour(s))  GLUCOSE, CAPILLARY     Status: Abnormal   Collection Time   03/26/12 11:46 AM      Component Value Range Comment   Glucose-Capillary 131 (*) 70 - 99 mg/dL    Comment 1 Notify RN     GLUCOSE, CAPILLARY     Status: Normal   Collection Time   03/26/12  4:46 PM      Component Value Range Comment   Glucose-Capillary 98  70 - 99 mg/dL   GLUCOSE, CAPILLARY     Status: Abnormal   Collection Time   03/26/12  8:42 PM       Component Value Range Comment   Glucose-Capillary 135 (*) 70 - 99 mg/dL    Comment 1 Notify RN     GLUCOSE, CAPILLARY     Status: Abnormal   Collection Time   03/27/12  7:33 AM      Component Value Range Comment   Glucose-Capillary 108 (*) 70 - 99 mg/dL    Comment 1 Notify RN     GLUCOSE, CAPILLARY     Status: Abnormal   Collection Time   03/27/12 12:20 PM      Component Value Range Comment   Glucose-Capillary 125 (*) 70 - 99 mg/dL    Comment 1 Notify RN     GLUCOSE, CAPILLARY     Status: Normal   Collection Time   03/27/12  4:55 PM      Component Value Range Comment   Glucose-Capillary 97  70 - 99 mg/dL   GLUCOSE, CAPILLARY     Status: Normal   Collection Time   03/27/12  9:16 PM      Component Value Range Comment   Glucose-Capillary 92  70 - 99 mg/dL   GLUCOSE, CAPILLARY     Status: Abnormal   Collection Time   03/28/12  7:24 AM      Component Value Range Comment   Glucose-Capillary 102 (*) 70 - 99 mg/dL    Comment 1 Notify RN     GLUCOSE, CAPILLARY     Status: Abnormal  Collection Time   03/28/12 11:52 AM      Component Value Range Comment   Glucose-Capillary 130 (*) 70 - 99 mg/dL    Comment 1 Notify RN     GLUCOSE, CAPILLARY     Status: Abnormal   Collection Time   03/28/12  4:43 PM      Component Value Range Comment   Glucose-Capillary 115 (*) 70 - 99 mg/dL    Comment 1 Notify RN     GLUCOSE, CAPILLARY     Status: Normal   Collection Time   03/28/12  9:13 PM      Component Value Range Comment   Glucose-Capillary 85  70 - 99 mg/dL   GLUCOSE, CAPILLARY     Status: Normal   Collection Time   03/29/12  7:38 AM      Component Value Range Comment   Glucose-Capillary 99  70 - 99 mg/dL    Comment 1 Documented in Chart        HEENT: R lens opacification Cardio: RRR Resp: CTA B/L GI: BS positive Extremity:  No Edema Skin:   Intact Neuro: Alert/Oriented, Anxious, Cranial Nerve II-XII normal, Normal Sensory, Normal Motor and Other poor sitting balance leans to  Right Musc/Skel:  Other Bilateral shoulder contracture   Assessment/Plan: 1. Functional deficits secondary to Thrombotic R cerebellar infarct which require 3+ hours per day of interdisciplinary therapy in a comprehensive inpatient rehab setting. Physiatrist is providing close team supervision and 24 hour management of active medical problems listed below. Physiatrist and rehab team continue to assess barriers to discharge/monitor patient progress toward functional and medical goals. FIM: FIM - Bathing Bathing Steps Patient Completed: Chest;Right Arm;Left Arm;Abdomen;Front perineal area;Buttocks;Right upper leg;Left upper leg;Right lower leg (including foot);Left lower leg (including foot) Bathing: 4: Steadying assist  FIM - Upper Body Dressing/Undressing Upper body dressing/undressing steps patient completed: Thread/unthread right bra strap;Thread/unthread left bra strap;Thread/unthread right sleeve of pullover shirt/dresss;Thread/unthread left sleeve of pullover shirt/dress;Put head through opening of pull over shirt/dress;Pull shirt over trunk Upper body dressing/undressing: 4: Min-Patient completed 75 plus % of tasks FIM - Lower Body Dressing/Undressing Lower body dressing/undressing steps patient completed: Thread/unthread right underwear leg;Thread/unthread left pants leg;Fasten/unfasten right shoe;Fasten/unfasten left shoe;Pull pants up/down;Thread/unthread left underwear leg;Fasten/unfasten pants;Pull underwear up/down;Don/Doff right sock;Thread/unthread right pants leg;Don/Doff left sock;Don/Doff right shoe;Don/Doff left shoe Lower body dressing/undressing: 4: Steadying Assist  FIM - Toileting Toileting steps completed by patient: Adjust clothing prior to toileting;Adjust clothing after toileting Toileting: 0: Activity did not occur  FIM - Diplomatic Services operational officer Devices: Art gallery manager Transfers: 0-Activity did not occur  FIM - Event organiser Devices: Bed rails;Arm rests Bed/Chair Transfer: 6: More than reasonable amt of time;5: Bed > Chair or W/C: Supervision (verbal cues/safety issues);5: Chair or W/C > Bed: Supervision (verbal cues/safety issues)  FIM - Locomotion: Wheelchair Distance: 30 Locomotion: Wheelchair: 1: Total Assistance/staff pushes wheelchair (Pt<25%) FIM - Locomotion: Ambulation Locomotion: Ambulation Assistive Devices: Designer, industrial/product Ambulation/Gait Assistance: 3: Mod assist Locomotion: Ambulation: 2: Travels 50 - 149 ft with moderate assistance (Pt: 50 - 74%)  Comprehension Comprehension Mode: Auditory Comprehension: 6-Follows complex conversation/direction: With extra time/assistive device  Expression Expression Mode: Verbal Expression: 6-Expresses complex ideas: With extra time/assistive device  Social Interaction Social Interaction: 6-Interacts appropriately with others with medication or extra time (anti-anxiety, antidepressant).  Problem Solving Problem Solving: 6-Solves complex problems: With extra time  Memory Memory: 6-More than reasonable amt of time  1. 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 DVT Prophylaxis/Anticoagulation: Subcutaneous heparin. Monitor platelet counts any signs of bleeding  3. Diabetes mellitus. Hemoglobin A1c 6.5. Glucophage 500 mg twice a day. Check blood sugars a.c. and at bedtime  4. Neuropsych: This patient is capable of making decisions on his/her own behalf.  5. Hypertension. Coreg 40 mg daily, lisinopril 40 mg daily. Monitor with increased activity. Secondary prevention education  6. Hyperlipidemia. Zocor  7. Blindness right eye  8. Leukocytosis. White blood cell count 11.8 WBCs normalizing 9.  Constipation due to immobility will add senna   LOS (Days) 6 A FACE TO FACE EVALUATION WAS PERFORMED  KIRSTEINS,ANDREW E 03/29/2012, 9:44 AM

## 2012-03-30 ENCOUNTER — Inpatient Hospital Stay (HOSPITAL_COMMUNITY): Payer: Medicare Other | Admitting: Occupational Therapy

## 2012-03-30 ENCOUNTER — Inpatient Hospital Stay (HOSPITAL_COMMUNITY): Payer: Medicare Other | Admitting: *Deleted

## 2012-03-30 ENCOUNTER — Inpatient Hospital Stay (HOSPITAL_COMMUNITY): Payer: Medicare Other | Admitting: Physical Therapy

## 2012-03-30 ENCOUNTER — Inpatient Hospital Stay (HOSPITAL_COMMUNITY): Payer: Medicare Other

## 2012-03-30 DIAGNOSIS — I69993 Ataxia following unspecified cerebrovascular disease: Secondary | ICD-10-CM

## 2012-03-30 DIAGNOSIS — I6330 Cerebral infarction due to thrombosis of unspecified cerebral artery: Secondary | ICD-10-CM

## 2012-03-30 DIAGNOSIS — Z5189 Encounter for other specified aftercare: Secondary | ICD-10-CM

## 2012-03-30 LAB — GLUCOSE, CAPILLARY
Glucose-Capillary: 107 mg/dL — ABNORMAL HIGH (ref 70–99)
Glucose-Capillary: 172 mg/dL — ABNORMAL HIGH (ref 70–99)

## 2012-03-30 NOTE — Progress Notes (Signed)
Recreational Therapy Session Note  Patient Details  Name: Lynn Weeks MRN: [id number] Date of Birth: 1918-08-30 Today's Date: 03/30/2012 Time: 9-930  Pain: no c/o Skilled Therapeutic Interventions/Progress Updates: met with pt and family to discuss potential activities for participation post discharge.  Also discussed community reintegration, energy conservation techniques, and compensatory strategies for use during leisure/community pursuits.  Pt agreeable to participate in community reintegration while on CIR.  Pt verbalized concerns about her ability to complete leisure activities at discharge, reinforcement given by LRT/CTRS and family.  Pt stated understanding.  Therapy/Group: Individual Therapy   Fredderick Swanger 03/30/2012, 10:11 AM

## 2012-03-30 NOTE — Progress Notes (Signed)
Physical Therapy Session Note  Patient Details  Name: Lynn Weeks MRN: [id number] Date of Birth: 04-Oct-1918  Today's Date: 03/30/2012 Time: 1000-1100 Time Calculation (min): 60 min  Short Term Goals: Week 2:     Skilled Therapeutic Interventions/Progress Updates:  Patient ambulated with hand held assist 200+ feet with min assist multiple times. Patient tends to lean to right and have decreased weight shift to left. Worked on reaching task in standing to facilitate weight shift to left. Patient and daughter feel that patient has a leg length discrepancy which may be reason for lean to right. Patient ambulated with 1/2 inch shoe lift on right 200 with min assist. Patient's balance seemed better. Patient reported some pain in back by using lift. Attempted to use quad cane. Patient reported feeling more balanced with hand held assist. Patient also reported that headache and dizziness was affecting balance.  Therapy Documentation Precautions:  Precautions Precautions: Fall Precaution Comments: poor balance, leans to right Restrictions Weight Bearing Restrictions: No   Pain: Pain Assessment Pain Assessment: 0-10 Pain Score:   6 Pain Type: Acute pain Pain Location: Head Pain Descriptors: Headache Nurse made aware and patient given Tylenol.  Locomotion : Ambulation Ambulation/Gait Assistance: 4: Min assist   See FIM for current functional status  Therapy/Group: Individual Therapy  Arelia Longest M 03/30/2012, 2:59 PM

## 2012-03-30 NOTE — Progress Notes (Signed)
Physical Therapy Session Note  Patient Details  Name: Lynn Weeks MRN: [id number] Date of Birth: 17-Dec-1918  Today's Date: 03/30/2012 Time: 1100-1140 Time Calculation (min): 40 min  Short Term Goals: Week 1:  PT Short Term Goal 1 (Week 1): Pt will perform functional transfers with min A PT Short Term Goal 1 - Progress (Week 1): Progressing toward goal PT Short Term Goal 2 (Week 1): Pt will gait with min A 50' in controlled environment PT Short Term Goal 2 - Progress (Week 1): Progressing toward goal PT Short Term Goal 3 (Week 1): Pt will maintain dynamic standing balance during functional task with min A PT Short Term Goal 3 - Progress (Week 1): Progressing toward goal  Skilled Therapeutic Interventions/Progress Updates:   Patient participated in walking group with focus on higher level gait challenges with shoe lift donned on R foot secondary to leg length discrepancy x 140' with vertical, horizontal head turns, sudden stops, changes in direction.  Patient began to c/o R knee pain and back pain with shoe lift; removed shoe lift for remainder of group; performed stair training up and down 5 steps x 2 reps with one rail (L rail) with alternating pattern to facilitate L lateral weight shift and extension through RLE with min-mod A overall.  Patient became ill, nausea and vomiting; RN notified.  Patient returned to room with assistance secondary to fatigue, and vomiting.    Therapy Documentation Precautions:  Precautions Precautions: Fall Precaution Comments: poor balance, leans to right Restrictions Weight Bearing Restrictions: No General: Amount of Missed PT Time (min): 20 Minutes Missed Time Reason: Patient ill (comment) (nausea and vomiting) Pain: Pain Assessment Pain Assessment: 0-10 Pain Score: 0-No pain Pain Type: Acute pain Pain Location: Head Pain Descriptors: Headache Pain Intervention(s): Medication (See eMAR)   See FIM for current functional  status  Therapy/Group: Group Therapy  Edman Circle Faucette 03/30/2012, 12:22 PM

## 2012-03-30 NOTE — Progress Notes (Signed)
Occupational Therapy Session Note  Patient Details  Name: Lynn Weeks MRN: [id number] Date of Birth: 25-Jan-1919  Today's Date: 03/30/2012 Time: 0730-0825 Time Calculation (min): 55 min  Short Term Goals: Week 1:  OT Short Term Goal 1 (Week 1): Pt will be able to don pants over feet with supervision. OT Short Term Goal 2 (Week 1): Pt will don socks and shoes with min assist. OT Short Term Goal 3 (Week 1): Pt will transfer to toilet with min assist. OT Short Term Goal 4 (Week 1): Pt will transfer to walkin shower tub bench with min assist. OT Short Term Goal 5 (Week 1): Pt will be able to stand at sink to brush teeth with min assist.  Skilled Therapeutic Interventions/Progress Updates:    Pt seen for ADL retraining with focus on walk-in shower transfer, ambulation with HHA, grooming in standing, and LB dressing.  Upon arrival, pt reports needing to toilet.  Stand pivot transfer to toilet with min assist, pt able to manage clothing and complete hygiene with setup.  Pt completed walk-in shower transfer with use of grab bars and tub bench for safety and energy conservation.  Focus on standing balance with bathing.  Completed dressing at overall close supervision/setup at sit to stand level and min/steady assist in standing secondary to Rt lean.  Grooming conducted in standing with occasional min/steady assist with focus on use of mirror for visual cues/feedback for improved postural control.    Therapy Documentation Precautions:  Precautions Precautions: Fall Precaution Comments: poor balance, leans to right Restrictions Weight Bearing Restrictions: No General:   Vital Signs: Therapy Vitals Temp: 97.9 F (36.6 C) Temp src: Oral Pulse Rate: 83  Resp: 18  BP: 109/70 mmHg Patient Position, if appropriate: Lying Oxygen Therapy SpO2: 95 % O2 Device: None (Room air) Pain: Pain Assessment Pain Assessment: No/denies pain Pain Score: 0-No pain  See FIM for current functional  status  Therapy/Group: Individual Therapy  Leonette Monarch 03/30/2012, 8:27 AM

## 2012-03-30 NOTE — Progress Notes (Signed)
Patient ID: Lynn Weeks, female   DOB: 09-12-1918, 78 y.o.   MRN: [id number] Lynn Weeks is a 78 y.o. right-handed female with history of type 2 diabetes mellitus, hypertension, coronary artery disease with pacemaker as well his history of inner ear problems. Patient lives alone and was independent prior to admission driving short distances. Admitted 03/20/2012 with near syncope and dizziness. Cranial CT scan with no acute changes. Echocardiogram pending. Cardiac panel was negative. Carotid Dopplers with 40-59% right ICA stenosis.Marland Kitchen MRI of the brain not done secondary to pacemaker. Noted admission white blood cell count of 19,200 as well as review old records showing white blood cell count 19,800 in October of 2012. Chest x-ray was negative for infiltrate and urinalysis study negative with latest white blood cell count 15,500 on 03/20/2012 and monitored. Placed on scheduled Antivert 25 mg twice a day for dizziness. Neurology services consulted suspect possible right cerebellar infarcts not seen on initial cranial CT scan. Placed on Plavix therapy  Subjective/Complaints: Feels good Less leaning to R Review of Systems  Neurological: Positive for dizziness.  All other systems reviewed and are negative.    Objective: Vital Signs: Blood pressure 109/70, pulse 83, temperature 97.9 F (36.6 C), temperature source Oral, resp. rate 18, weight 47.7 kg (105 lb 2.6 oz), SpO2 95.00%. No results found. Results for orders placed during the hospital encounter of 03/23/12 (from the past 72 hour(s))  GLUCOSE, CAPILLARY     Status: Abnormal   Collection Time   03/27/12 12:20 PM      Component Value Range Comment   Glucose-Capillary 125 (*) 70 - 99 mg/dL    Comment 1 Notify RN     GLUCOSE, CAPILLARY     Status: Normal   Collection Time   03/27/12  4:55 PM      Component Value Range Comment   Glucose-Capillary 97  70 - 99 mg/dL   GLUCOSE, CAPILLARY     Status: Normal   Collection Time   03/27/12  9:16 PM     Component Value Range Comment   Glucose-Capillary 92  70 - 99 mg/dL   GLUCOSE, CAPILLARY     Status: Abnormal   Collection Time   03/28/12  7:24 AM      Component Value Range Comment   Glucose-Capillary 102 (*) 70 - 99 mg/dL    Comment 1 Notify RN     GLUCOSE, CAPILLARY     Status: Abnormal   Collection Time   03/28/12 11:52 AM      Component Value Range Comment   Glucose-Capillary 130 (*) 70 - 99 mg/dL    Comment 1 Notify RN     GLUCOSE, CAPILLARY     Status: Abnormal   Collection Time   03/28/12  4:43 PM      Component Value Range Comment   Glucose-Capillary 115 (*) 70 - 99 mg/dL    Comment 1 Notify RN     GLUCOSE, CAPILLARY     Status: Normal   Collection Time   03/28/12  9:13 PM      Component Value Range Comment   Glucose-Capillary 85  70 - 99 mg/dL   GLUCOSE, CAPILLARY     Status: Normal   Collection Time   03/29/12  7:38 AM      Component Value Range Comment   Glucose-Capillary 99  70 - 99 mg/dL    Comment 1 Documented in Chart     GLUCOSE, CAPILLARY     Status: Abnormal   Collection  Time   03/29/12 11:19 AM      Component Value Range Comment   Glucose-Capillary 131 (*) 70 - 99 mg/dL    Comment 1 Notify RN     GLUCOSE, CAPILLARY     Status: Abnormal   Collection Time   03/29/12  4:25 PM      Component Value Range Comment   Glucose-Capillary 134 (*) 70 - 99 mg/dL   GLUCOSE, CAPILLARY     Status: Abnormal   Collection Time   03/29/12  8:58 PM      Component Value Range Comment   Glucose-Capillary 177 (*) 70 - 99 mg/dL    Comment 1 Notify RN     GLUCOSE, CAPILLARY     Status: Abnormal   Collection Time   03/30/12  7:28 AM      Component Value Range Comment   Glucose-Capillary 138 (*) 70 - 99 mg/dL    Comment 1 Notify RN      Comment 2 Documented in Chart        HEENT: R lens opacification Cardio: RRR Resp: CTA B/L GI: BS positive Extremity:  No Edema Skin:   Intact Neuro: Alert/Oriented, Anxious, Cranial Nerve II-XII normal, Normal Sensory, Normal Motor and Other  fair static sitting balance Musc/Skel:  Other Bilateral shoulder contracture   Assessment/Plan: 1. Functional deficits secondary to Thrombotic R cerebellar infarct which require 3+ hours per day of interdisciplinary therapy in a comprehensive inpatient rehab setting. Physiatrist is providing close team supervision and 24 hour management of active medical problems listed below. Physiatrist and rehab team continue to assess barriers to discharge/monitor patient progress toward functional and medical goals. FIM: FIM - Bathing Bathing Steps Patient Completed: Chest;Right Arm;Left Arm;Abdomen;Front perineal area;Buttocks;Right upper leg;Left upper leg;Right lower leg (including foot);Left lower leg (including foot) Bathing: 4: Steadying assist  FIM - Upper Body Dressing/Undressing Upper body dressing/undressing steps patient completed: Thread/unthread right bra strap;Thread/unthread left bra strap;Thread/unthread right sleeve of pullover shirt/dresss;Thread/unthread left sleeve of pullover shirt/dress;Put head through opening of pull over shirt/dress;Pull shirt over trunk Upper body dressing/undressing: 4: Min-Patient completed 75 plus % of tasks FIM - Lower Body Dressing/Undressing Lower body dressing/undressing steps patient completed: Thread/unthread right underwear leg;Thread/unthread left underwear leg;Pull underwear up/down;Thread/unthread right pants leg;Thread/unthread left pants leg;Pull pants up/down;Don/Doff right sock;Don/Doff left sock;Don/Doff right shoe;Don/Doff left shoe;Fasten/unfasten right shoe;Fasten/unfasten left shoe Lower body dressing/undressing: 4: Steadying Assist  FIM - Toileting Toileting steps completed by patient: Adjust clothing prior to toileting;Performs perineal hygiene;Adjust clothing after toileting Toileting Assistive Devices: Grab bar or rail for support Toileting: 4: Steadying assist  FIM - Diplomatic Services operational officer Devices: Bedside  commode Toilet Transfers: 4-To toilet/BSC: Min A (steadying Pt. > 75%);4-From toilet/BSC: Min A (steadying Pt. > 75%)  FIM - Bed/Chair Transfer Bed/Chair Transfer Assistive Devices: Bed rails;Arm rests Bed/Chair Transfer: 5: Supine > Sit: Supervision (verbal cues/safety issues);4: Bed > Chair or W/C: Min A (steadying Pt. > 75%)  FIM - Locomotion: Wheelchair Distance: 30 Locomotion: Wheelchair: 1: Total Assistance/staff pushes wheelchair (Pt<25%) FIM - Locomotion: Ambulation Locomotion: Ambulation Assistive Devices: Designer, industrial/product Ambulation/Gait Assistance: 3: Mod assist Locomotion: Ambulation: 2: Travels 50 - 149 ft with moderate assistance (Pt: 50 - 74%)  Comprehension Comprehension Mode: Auditory Comprehension: 6-Follows complex conversation/direction: With extra time/assistive device  Expression Expression Mode: Verbal Expression: 6-Expresses complex ideas: With extra time/assistive device  Social Interaction Social Interaction: 6-Interacts appropriately with others with medication or extra time (anti-anxiety, antidepressant).  Problem Solving Problem Solving: 6-Solves complex problems: With  extra time  Memory Memory: 6-More than reasonable amt of time  1. 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 DVT Prophylaxis/Anticoagulation: Subcutaneous heparin. Monitor platelet counts any signs of bleeding  3. Diabetes mellitus. Hemoglobin A1c 6.5. Glucophage 500 mg twice a day. Check blood sugars a.c. and at bedtime  4. Neuropsych: This patient is capable of making decisions on his/her own behalf.  5. Hypertension. Coreg 40 mg daily, lisinopril 40 mg daily. Monitor with increased activity. Secondary prevention education  6. Hyperlipidemia. Zocor  7. Blindness right eye  8. Leukocytosis. White blood cell count 11.8 WBCs normalizing 9.  Constipation due to immobility will add senna   LOS (Days) 7 A FACE TO FACE EVALUATION WAS  PERFORMED  Lynn Weeks E 03/30/2012, 9:18 AM

## 2012-03-31 ENCOUNTER — Inpatient Hospital Stay (HOSPITAL_COMMUNITY): Payer: Medicare Other | Admitting: Physical Therapy

## 2012-03-31 ENCOUNTER — Inpatient Hospital Stay (HOSPITAL_COMMUNITY): Payer: Medicare Other | Admitting: Occupational Therapy

## 2012-03-31 LAB — GLUCOSE, CAPILLARY
Glucose-Capillary: 115 mg/dL — ABNORMAL HIGH (ref 70–99)
Glucose-Capillary: 134 mg/dL — ABNORMAL HIGH (ref 70–99)

## 2012-03-31 NOTE — Progress Notes (Signed)
Occupational Therapy Weekly Progress Note and Treatment Session  Patient Details  Name: Lynn Weeks MRN: [id number] Date of Birth: 11-05-1918  Today's Date: 03/31/2012 Time: 3086-5784 Time Calculation (min): 45 min  Patient has met 5 of 5 short term goals.  Pt is showing steady progress towards goals.  She has progressed from mod assist with basic self-care tasks to min/steady assist in standing and setup/supervision with bathing and dressing at seated position.  Pt is min assist with ambulation and functional transfers with self-care tasks.  Pt continues to demonstrate Rt lateral lean in standing and ambulating which increases with quick movements and turns.  Patient continues to demonstrate the following deficits: Rt lateral lean, impaired postural control, decreased midline orientation, decreased standing balance, decreased visual motor skills and decreased acuity and therefore will continue to benefit from skilled OT intervention to enhance overall performance with BADL and Reduce care partner burden.  Patient progressing toward long term goals..  Continue plan of care.  OT Short Term Goals Week 1:  OT Short Term Goal 1 (Week 1): Pt will be able to don pants over feet with supervision. OT Short Term Goal 1 - Progress (Week 1): Met OT Short Term Goal 2 (Week 1): Pt will don socks and shoes with min assist. OT Short Term Goal 2 - Progress (Week 1): Met OT Short Term Goal 3 (Week 1): Pt will transfer to toilet with min assist. OT Short Term Goal 3 - Progress (Week 1): Met OT Short Term Goal 4 (Week 1): Pt will transfer to walkin shower tub bench with min assist. OT Short Term Goal 4 - Progress (Week 1): Met OT Short Term Goal 5 (Week 1): Pt will be able to stand at sink to brush teeth with min assist. OT Short Term Goal 5 - Progress (Week 1): Met Week 2:  OT Short Term Goal 1 (Week 2): STG = LTGs, supervision overall  Skilled Therapeutic Interventions/Progress Updates:    Pt seen  for ADL retraining with focus on functional mobility with standard walker, standing tolerance with bathing and grooming, and dressing with increased independence.  Pt min assist ambulation with walker, with 1 LOB to Lt but able to regain balance with only min tactile cues.  Pt completed grooming in standing with close supervision.  Pt demonstrated slight Rt lateral lean in standing, but able to use visual cues of mirror to correct.  Setup assist for dressing except min/steady in standing with pulling up pants.  Therapy Documentation Precautions:  Precautions Precautions: Fall Precaution Comments: poor balance, leans to right Restrictions Weight Bearing Restrictions: No Pain: Pain Assessment Pain Assessment: No/denies pain Pain Score: 0-No pain ADL: ADL Eating: Set up Where Assessed-Eating: Chair Grooming: Supervision/safety;Setup Where Assessed-Grooming: Standing at sink Upper Body Bathing: Minimal cueing;Setup;Supervision/safety Where Assessed-Upper Body Bathing: Shower Lower Body Bathing: Minimal cueing;Minimal assistance Where Assessed-Lower Body Bathing: Shower Upper Body Dressing: Setup Where Assessed-Upper Body Dressing: Chair Lower Body Dressing: Setup;Minimal assistance Where Assessed-Lower Body Dressing: Chair (sit > stand) Toileting: Setup;Minimal assistance Where Assessed-Toileting: Toilet;Bedside Commode Toilet Transfer: Minimal assistance Toilet Transfer Method: Ambulating (with walker) Toilet Transfer Equipment: Therapist, sports Transfer: Minimal assistance Film/video editor Method: Designer, industrial/product: Transfer tub bench ADL Comments: Pt continues to demonstrate Rt lean with ambulating and in standing, focus on visual compensation to increase upright standing.    See FIM for current functional status  Therapy/Group: Individual Therapy  Leonette Monarch 03/31/2012, 10:20 AM

## 2012-03-31 NOTE — Patient Care Conference (Signed)
Inpatient RehabilitationTeam Conference Note Date: 03/31/2012   Time: 11:25 AM    Patient Name: Lynn Weeks      Medical Record Number: [id number]  Date of Birth: Jan 04, 1919 Sex: Female         Room/Bed: 4142/4142-01 Payor Info: Payor: MEDICARE  Plan: MEDICARE PART A AND B  Product Type: *No Product type*     Admitting Diagnosis: CVA  Admit Date/Time:  03/23/2012  6:38 PM Admission Comments: No comment available   Primary Diagnosis:  Stroke Principal Problem: Stroke  Patient Active Problem List   Diagnosis Date Noted  . Stroke 03/24/2012  . CVA (cerebral infarction) 03/21/2012  . Near syncope 03/20/2012  . Leukocytosis 03/20/2012  . CAD (coronary artery disease) 05/08/2011  . Complete heart block 05/08/2011  . Hypertensive heart disease without congestive heart failure 05/08/2011  . Hyperlipemia 05/08/2011  . Type 2 diabetes 05/08/2011  . Chronic kidney disease (CKD), stage III (moderate) 05/08/2011  . Hyponatremia 05/08/2011  . Murmur, cardiac 05/08/2011    Expected Discharge Date: Expected Discharge Date: 04/07/12  Team Members Present: Physician: Dr. Claudette Laws Social Worker Present: Dossie Der, LCSW Nurse Present: Chana Bode, RN PT Present: Edman Circle, Judith Blonder, PTA OT Present: Leonette Monarch, Felipa Eth, OT SLP Present: Fae Pippin, SLP Other (Discipline and Name): Charolette Child Coordinator     Current Status/Progress Goal Weekly Team Focus  Medical   Occasional dizziness likely stroke related, Poor exercise tolerance  Maintain therapy participation  Maintain rest breaks   Bowel/Bladder   continent bowel and bladder, last BM 9-3 resulted from sorbital given  remain continent  monitor pt for BM q2 days   Swallow/Nutrition/ Hydration             ADL's   min/steady assist bathing and dressing, supervision grooming, hand held assist with ambulation and transfers  supervision overall  family brought in pt's walker Tues - plan to use  in therapies, hands on education when family present, dynamic sitting and standing balance   Mobility   overall min@ at ambulatory level  Supervision gait x 150', steps with supervision      Communication             Safety/Cognition/ Behavioral Observations            Pain   tylenol 650 Q4 hrs PRN  3 or less  monitor pain and effects of pain meds   Skin   skin tear right elbow with allevyn inplace. scattered bruising to abdomen and arm  no new skin breakdown  assess skin q shift      *See Interdisciplinary Assessment and Plan and progress notes for long and short-term goals  Barriers to Discharge: Family training  not completed    Possible Resolutions to Barriers:  Complete family training    Discharge Planning/Teaching Needs:  Home with family providing 24 hour care, aware and here daily observing pt in therapies.      Team Discussion:  Skin is healing, nauseated and vomited yesterday-caused by too much sugar at breakfast.  Medically doing well.  Revisions to Treatment Plan:  NOne   Continued Need for Acute Rehabilitation Level of Care: The patient requires daily medical management by a physician with specialized training in physical medicine and rehabilitation for the following conditions: Daily direction of a multidisciplinary physical rehabilitation program to ensure safe treatment while eliciting the highest outcome that is of practical value to the patient.: Yes Daily medical management of patient stability for increased activity  during participation in an intensive rehabilitation regime.: Yes Daily analysis of laboratory values and/or radiology reports with any subsequent need for medication adjustment of medical intervention for : Neurological problems  Colletta Spillers, [name] 04/02/2012, 9:37 AM

## 2012-03-31 NOTE — Progress Notes (Signed)
Physical Therapy Weekly Progress Note  Patient Details  Name: Lynn Weeks MRN: [id number] Date of Birth: 1918-09-03  Today's Date: 03/31/2012 Time: 1478-2956 Time Calculation (min): 59 min  Patient is making good progress towards PT LTG and has met 3 of 3 short term goals.  Patient requires min A overall for transfers and gait in controlled environment with RW but continues to demonstrate increased R lateral lean and LOB when fatigued and during direction changes and when pivoting. Patient noted to have R leg length discrepancy but when attempted to correct with shoe lift patient did present with improved balance and postural control but began to c/o knee pain and low back pain.     Patient continues to demonstrate the following deficits: impaired activity tolerance/endurance, dizziness with downward gaze and functional mobility, impaired strength, motor control, timing/sequencing, dynamic standing balance, and gait and therefore will continue to benefit from skilled PT intervention to enhance overall performance with activity tolerance, balance, postural control, ability to compensate for deficits, functional use of  right lower extremity and coordination.  Patient progressing toward long term goals..  Continue plan of care.  PT Short Term Goals Week 1:  PT Short Term Goal 1 (Week 1): Pt will perform functional transfers with min A PT Short Term Goal 1 - Progress (Week 1): Met PT Short Term Goal 2 (Week 1): Pt will gait with min A 50' in controlled environment PT Short Term Goal 2 - Progress (Week 1): Met PT Short Term Goal 3 (Week 1): Pt will maintain dynamic standing balance during functional task with min A PT Short Term Goal 3 - Progress (Week 1): Met Week 2:  PT Short Term Goal 1 (Week 2): = LTG supervision overall   Skilled Therapeutic Interventions/Progress Updates:   Patient reporting having SW at home but reports that when she was using the SW in the am it was causing UE pain  and fatigue from picking it up; changed to RW this am.  Patient performed gait training x 150' on unit with RW x 2 reps at beginning of session and end of session with min A during straight navigation with minimal R lateral lean and R ADD but required mod A during direction changes and pivot secondary to LOB to R; performed lateral stepping to R and L with bilat HHA and min A with focus on full L and R lateral weight shift and R lateral hip stability to prevent COG of progressing past R stance LE leading to R LOB; at end of session patient demonstrated increased R lateral lean and LOB secondary to fatigue.    NMR: performed standing balance and balance strategy training for ankle, hip strategies and lateral hip stability: standing on compliant foam performed ball toss and ball bounce toss in midline and then during R and L rotation with min-mod A overall to maintain COG over BOS and to self correct posterior, anterior and R LOB.  Standing on solid surface performed R and LLE stepping to visual target laterally, anterior and across midline for stance phase control and lateral hip stability during transverse plane movements.    Therapy Documentation Precautions:  Precautions Precautions: Fall Precaution Comments: poor balance, leans to right Restrictions Weight Bearing Restrictions: No Vital Signs: Therapy Vitals Temp: 97.7 F (36.5 C) Pulse Rate: 71  Resp: 16  BP: 118/61 mmHg Oxygen Therapy SpO2: 97 % O2 Device: None (Room air) Pain:  No c/o pain or dizziness or nausea at beginning of session  See FIM  for current functional status  Therapy/Group: Individual Therapy  Edman Circle [facility] 03/31/2012, 4:38 PM

## 2012-03-31 NOTE — Progress Notes (Signed)
Recreational Therapy Session Note  Patient Details  Name: Lynn Weeks MRN: [id number] Date of Birth: [birth year] Today's Date: 03/31/2012  Pain: no c/o Skilled Therapeutic Interventions/Progress Updates: Pt participated in animal assisted activity/therapy bed level with supervision.  Benjamim Harnish 03/31/2012, 5:54 PM

## 2012-03-31 NOTE — Progress Notes (Signed)
Physical Therapy Note  Patient Details  Name: Lynn Weeks MRN: [id number] Date of Birth: 04-27-1919 Today's Date: 03/31/2012  11:30-12:00 individual therapy pt denied pain.  Pt was using SW for gait, performed rw trial with improvement in gait speed and step through pattern. incourperated head turns and maneuvering around obstacles overall minguard assist. Pt preferred rw but need a short walker to decrease UE fatigue. Pt was able to self correct LOB to rt with rw after repitition. Max vc needed for hand placement carry-over with sit to stand and for walker safety and placement with backing. Son was present and was edcuated on importance of walker position with backing to decrease fall risk   Julian Reil 03/31/2012, 12:34 PM

## 2012-03-31 NOTE — Plan of Care (Signed)
Problem: RH BOWEL ELIMINATION Goal: RH STG MANAGE BOWEL W/MEDICATION W/ASSISTANCE STG Manage Bowel with Medication with min Assistance.  Outcome: Not Progressing Patient refused sorbitol, last bowel movement 03-28-12.  Problem: RH BOWEL ELIMINATION Goal: RH STG MANAGE BOWEL WITH ASSISTANCE STG Manage Bowel with min Assistance with medication  Outcome: Not Progressing Patient refused sorbitol, last bowel movement 03-28-12.

## 2012-03-31 NOTE — Progress Notes (Signed)
Physical Therapy Session Note  Patient Details  Name: Lynn Weeks MRN: [id number] Date of Birth: August 04, 1918  Today's Date: 03/31/2012 Time: 0830-0930 Time Calculation (min): 60 min  Short Term Goals: Week 2:     Skilled Therapeutic Interventions/Progress Updates:    pt sit to stand MinA from chair.  Amb with RW 100-150' at a time in hospital environment and outside on sidewalk and uneven surfaces, on/off elevator, carpet.  Pt initially able to walk 150' before needing rest break, but on the way back inside needed rest breaks sooner.  Occasional cueing needed to attend to R LE and to stay closer to RW.    Therapy Documentation Precautions:  Precautions Precautions: Fall Precaution Comments: poor balance, leans to right Restrictions Weight Bearing Restrictions: No  See FIM for current functional status  Therapy/Group: Individual Therapy  Sharnese Heath, Alison Murray 03/31/2012, 2:30 PM

## 2012-03-31 NOTE — Progress Notes (Signed)
Patient ID: Lynn Weeks, female   DOB: July 13, 1919, 78 y.o.   MRN: [id number] Lynn Weeks is a 78 y.o. right-handed female with history of type 2 diabetes mellitus, hypertension, coronary artery disease with pacemaker as well his history of inner ear problems. Patient lives alone and was independent prior to admission driving short distances. Admitted 03/20/2012 with near syncope and dizziness. Cranial CT scan with no acute changes. Echocardiogram pending. Cardiac panel was negative. Carotid Dopplers with 40-59% right ICA stenosis.Marland Kitchen MRI of the brain not done secondary to pacemaker. Noted admission white blood cell count of 19,200 as well as review old records showing white blood cell count 19,800 in October of 2012. Chest x-ray was negative for infiltrate and urinalysis study negative with latest white blood cell count 15,500 on 03/20/2012 and monitored. Placed on scheduled Antivert 25 mg twice a day for dizziness. Neurology services consulted suspect possible right cerebellar infarcts not seen on initial cranial CT scan. Placed on Plavix therapy  Subjective/Complaints: Tired but no other c/os  No pain Review of Systems  Neurological: Positive for dizziness.  All other systems reviewed and are negative.    Objective: Vital Signs: Blood pressure 144/79, pulse 72, temperature 97.9 F (36.6 C), temperature source Oral, resp. rate 19, weight 47.7 kg (105 lb 2.6 oz), SpO2 93.00%. No results found. Results for orders placed during the hospital encounter of 03/23/12 (from the past 72 hour(s))  GLUCOSE, CAPILLARY     Status: Abnormal   Collection Time   03/28/12 11:52 AM      Component Value Range Comment   Glucose-Capillary 130 (*) 70 - 99 mg/dL    Comment 1 Notify RN     GLUCOSE, CAPILLARY     Status: Abnormal   Collection Time   03/28/12  4:43 PM      Component Value Range Comment   Glucose-Capillary 115 (*) 70 - 99 mg/dL    Comment 1 Notify RN     GLUCOSE, CAPILLARY     Status: Normal   Collection Time   03/28/12  9:13 PM      Component Value Range Comment   Glucose-Capillary 85  70 - 99 mg/dL   GLUCOSE, CAPILLARY     Status: Normal   Collection Time   03/29/12  7:38 AM      Component Value Range Comment   Glucose-Capillary 99  70 - 99 mg/dL    Comment 1 Documented in Chart     GLUCOSE, CAPILLARY     Status: Abnormal   Collection Time   03/29/12 11:19 AM      Component Value Range Comment   Glucose-Capillary 131 (*) 70 - 99 mg/dL    Comment 1 Notify RN     GLUCOSE, CAPILLARY     Status: Abnormal   Collection Time   03/29/12  4:25 PM      Component Value Range Comment   Glucose-Capillary 134 (*) 70 - 99 mg/dL   GLUCOSE, CAPILLARY     Status: Abnormal   Collection Time   03/29/12  8:58 PM      Component Value Range Comment   Glucose-Capillary 177 (*) 70 - 99 mg/dL    Comment 1 Notify RN     GLUCOSE, CAPILLARY     Status: Abnormal   Collection Time   03/30/12  7:28 AM      Component Value Range Comment   Glucose-Capillary 138 (*) 70 - 99 mg/dL    Comment 1 Notify RN  Comment 2 Documented in Chart     GLUCOSE, CAPILLARY     Status: Abnormal   Collection Time   03/30/12 11:50 AM      Component Value Range Comment   Glucose-Capillary 107 (*) 70 - 99 mg/dL    Comment 1 Notify RN      Comment 2 Documented in Chart     GLUCOSE, CAPILLARY     Status: Abnormal   Collection Time   03/30/12  4:37 PM      Component Value Range Comment   Glucose-Capillary 172 (*) 70 - 99 mg/dL   GLUCOSE, CAPILLARY     Status: Normal   Collection Time   03/30/12  9:06 PM      Component Value Range Comment   Glucose-Capillary 96  70 - 99 mg/dL   GLUCOSE, CAPILLARY     Status: Abnormal   Collection Time   03/31/12  7:20 AM      Component Value Range Comment   Glucose-Capillary 114 (*) 70 - 99 mg/dL    Comment 1 Notify RN        HEENT: R lens opacification Cardio: RRR Resp: CTA B/L GI: BS positive Extremity:  No Edema Skin:   Intact Neuro: Alert/Oriented, Anxious, Cranial Nerve II-XII  normal, Normal Sensory, Normal Motor and Other fair static sitting balance Musc/Skel:  Other Bilateral shoulder contracture   Assessment/Plan: 1. Functional deficits secondary to Thrombotic R cerebellar infarct which require 3+ hours per day of interdisciplinary therapy in a comprehensive inpatient rehab setting. Physiatrist is providing close team supervision and 24 hour management of active medical problems listed below. Physiatrist and rehab team continue to assess barriers to discharge/monitor patient progress toward functional and medical goals. FIM: FIM - Bathing Bathing Steps Patient Completed: Chest;Right Arm;Left Arm;Front perineal area;Buttocks Bathing: 3: Mod-Patient completes 5-7 15f 10 parts or 50-74%  FIM - Upper Body Dressing/Undressing Upper body dressing/undressing steps patient completed: Thread/unthread right bra strap;Thread/unthread left bra strap;Hook/unhook bra;Thread/unthread right sleeve of pullover shirt/dresss;Thread/unthread left sleeve of pullover shirt/dress;Put head through opening of pull over shirt/dress;Pull shirt over trunk Upper body dressing/undressing: 5: Set-up assist to: Obtain clothing/put away FIM - Lower Body Dressing/Undressing Lower body dressing/undressing steps patient completed: Thread/unthread right underwear leg;Thread/unthread left underwear leg;Pull underwear up/down;Thread/unthread right pants leg;Thread/unthread left pants leg;Pull pants up/down;Don/Doff right sock;Don/Doff left sock;Don/Doff right shoe;Don/Doff left shoe;Fasten/unfasten right shoe;Fasten/unfasten left shoe Lower body dressing/undressing: 4: Steadying Assist  FIM - Toileting Toileting steps completed by patient: Adjust clothing prior to toileting;Performs perineal hygiene;Adjust clothing after toileting Toileting Assistive Devices: Grab bar or rail for support Toileting: 4: Steadying assist  FIM - Diplomatic Services operational officer Devices: Bedside  commode Toilet Transfers: 4-To toilet/BSC: Min A (steadying Pt. > 75%);4-From toilet/BSC: Min A (steadying Pt. > 75%)  FIM - Banker Devices: Walker;Bed rails Bed/Chair Transfer: 5: Supine > Sit: Supervision (verbal cues/safety issues);4: Bed > Chair or W/C: Min A (steadying Pt. > 75%)  FIM - Locomotion: Wheelchair Distance: 30 Locomotion: Wheelchair: 1: Total Assistance/staff pushes wheelchair (Pt<25%) FIM - Locomotion: Ambulation Locomotion: Ambulation Assistive Devices:  (hand held assist) Ambulation/Gait Assistance: 4: Min assist Locomotion: Ambulation: 4: Travels 150 ft or more with minimal assistance (Pt.>75%)  Comprehension Comprehension Mode: Auditory Comprehension: 6-Follows complex conversation/direction: With extra time/assistive device  Expression Expression Mode: Verbal Expression: 6-Expresses complex ideas: With extra time/assistive device  Social Interaction Social Interaction: 6-Interacts appropriately with others with medication or extra time (anti-anxiety, antidepressant).  Problem Solving Problem Solving: 6-Solves complex  problems: With extra time  Memory Memory: 6-More than reasonable amt of time  1. 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 DVT Prophylaxis/Anticoagulation: Subcutaneous heparin. Monitor platelet counts any signs of bleeding  3. Diabetes mellitus. Hemoglobin A1c 6.5. Glucophage 500 mg twice a day. Check blood sugars a.c. and at bedtime  4. Neuropsych: This patient is capable of making decisions on his/her own behalf.  5. Hypertension. Coreg 40 mg daily, lisinopril 40 mg daily. Monitor with increased activity. Secondary prevention education  6. Hyperlipidemia. Zocor  7. Blindness right eye  8. Leukocytosis. White blood cell count 11.8 WBCs normalizing 9.  Constipation due to immobility will add senna   LOS (Days) 8 A FACE TO FACE EVALUATION WAS  PERFORMED  Gunner Iodice E 03/31/2012, 9:48 AM

## 2012-04-01 ENCOUNTER — Inpatient Hospital Stay (HOSPITAL_COMMUNITY): Payer: Medicare Other | Admitting: Physical Therapy

## 2012-04-01 ENCOUNTER — Inpatient Hospital Stay (HOSPITAL_COMMUNITY): Payer: Medicare Other | Admitting: Occupational Therapy

## 2012-04-01 LAB — GLUCOSE, CAPILLARY
Glucose-Capillary: 102 mg/dL — ABNORMAL HIGH (ref 70–99)
Glucose-Capillary: 114 mg/dL — ABNORMAL HIGH (ref 70–99)

## 2012-04-01 MED ORDER — METFORMIN HCL 500 MG PO TABS
500.0000 mg | ORAL_TABLET | Freq: Two times a day (BID) | ORAL | Status: DC
  Administered 2012-04-01 – 2012-04-06 (×10): 500 mg via ORAL
  Filled 2012-04-01 (×12): qty 1

## 2012-04-01 NOTE — Progress Notes (Signed)
Patient ID: Burnis Medin, female   DOB: [birth year], 78 y.o.   MRN: [id number] Elbert Ewings Gibb is a 78 y.o. right-handed female with history of type 2 diabetes mellitus, hypertension, coronary artery disease with pacemaker as well his history of inner ear problems. Patient lives alone and was independent prior to admission driving short distances. Admitted 03/20/2012 with near syncope and dizziness. Cranial CT scan with no acute changes. Echocardiogram pending. Cardiac panel was negative. Carotid Dopplers with 40-59% right ICA stenosis.Marland Kitchen MRI of the brain not done secondary to pacemaker. Noted admission white blood cell count of 19,200 as well as review old records showing white blood cell count 19,800 in October of 2012. Chest x-ray was negative for infiltrate and urinalysis study negative with latest white blood cell count 15,500 on 03/20/2012 and monitored. Placed on scheduled Antivert 25 mg twice a day for dizziness. Neurology services consulted suspect possible right cerebellar infarcts not seen on initial cranial CT scan. Placed on Plavix therapy  Subjective/Complaints: Vomited after pills Review of Systems  Neurological: Positive for dizziness.  All other systems reviewed and are negative.    Objective: Vital Signs: Blood pressure 127/77, pulse 72, temperature 97.5 F (36.4 C), temperature source Oral, resp. rate 16, weight 43.8 kg (96 lb 9 oz), SpO2 94.00%. No results found. Results for orders placed during the hospital encounter of 03/23/12 (from the past 72 hour(s))  GLUCOSE, CAPILLARY     Status: Abnormal   Collection Time   03/29/12 11:19 AM      Component Value Range Comment   Glucose-Capillary 131 (*) 70 - 99 mg/dL    Comment 1 Notify RN     GLUCOSE, CAPILLARY     Status: Abnormal   Collection Time   03/29/12  4:25 PM      Component Value Range Comment   Glucose-Capillary 134 (*) 70 - 99 mg/dL   GLUCOSE, CAPILLARY     Status: Abnormal   Collection Time   03/29/12  8:58 PM   Component Value Range Comment   Glucose-Capillary 177 (*) 70 - 99 mg/dL    Comment 1 Notify RN     GLUCOSE, CAPILLARY     Status: Abnormal   Collection Time   03/30/12  7:28 AM      Component Value Range Comment   Glucose-Capillary 138 (*) 70 - 99 mg/dL    Comment 1 Notify RN      Comment 2 Documented in Chart     GLUCOSE, CAPILLARY     Status: Abnormal   Collection Time   03/30/12 11:50 AM      Component Value Range Comment   Glucose-Capillary 107 (*) 70 - 99 mg/dL    Comment 1 Notify RN      Comment 2 Documented in Chart     GLUCOSE, CAPILLARY     Status: Abnormal   Collection Time   03/30/12  4:37 PM      Component Value Range Comment   Glucose-Capillary 172 (*) 70 - 99 mg/dL   GLUCOSE, CAPILLARY     Status: Normal   Collection Time   03/30/12  9:06 PM      Component Value Range Comment   Glucose-Capillary 96  70 - 99 mg/dL   GLUCOSE, CAPILLARY     Status: Abnormal   Collection Time   03/31/12  7:20 AM      Component Value Range Comment   Glucose-Capillary 114 (*) 70 - 99 mg/dL    Comment 1 Notify RN  GLUCOSE, CAPILLARY     Status: Abnormal   Collection Time   03/31/12 11:40 AM      Component Value Range Comment   Glucose-Capillary 115 (*) 70 - 99 mg/dL    Comment 1 Notify RN     GLUCOSE, CAPILLARY     Status: Abnormal   Collection Time   03/31/12  4:19 PM      Component Value Range Comment   Glucose-Capillary 134 (*) 70 - 99 mg/dL    Comment 1 Notify RN     GLUCOSE, CAPILLARY     Status: Abnormal   Collection Time   03/31/12  9:00 PM      Component Value Range Comment   Glucose-Capillary 102 (*) 70 - 99 mg/dL   GLUCOSE, CAPILLARY     Status: Abnormal   Collection Time   04/01/12  7:25 AM      Component Value Range Comment   Glucose-Capillary 136 (*) 70 - 99 mg/dL      HEENT: R lens opacification Cardio: RRR Resp: CTA B/L GI: BS positive Extremity:  No Edema Skin:   Intact Neuro: Alert/Oriented, Anxious, Cranial Nerve II-XII normal, Normal Sensory, Normal Motor and  Other fair static sitting balance Musc/Skel:  Other Bilateral shoulder contracture   Assessment/Plan: 1. Functional deficits secondary to Thrombotic R cerebellar infarct which require 3+ hours per day of interdisciplinary therapy in a comprehensive inpatient rehab setting. Physiatrist is providing close team supervision and 24 hour management of active medical problems listed below. Physiatrist and rehab team continue to assess barriers to discharge/monitor patient progress toward functional and medical goals. FIM: FIM - Bathing Bathing Steps Patient Completed: Chest;Right Arm;Left Arm;Abdomen;Front perineal area;Buttocks;Right upper leg;Left upper leg;Right lower leg (including foot);Left lower leg (including foot) Bathing: 4: Steadying assist  FIM - Upper Body Dressing/Undressing Upper body dressing/undressing steps patient completed: Thread/unthread right bra strap;Thread/unthread left bra strap;Thread/unthread right sleeve of pullover shirt/dresss;Thread/unthread left sleeve of pullover shirt/dress;Put head through opening of pull over shirt/dress;Pull shirt over trunk Upper body dressing/undressing: 4: Min-Patient completed 75 plus % of tasks FIM - Lower Body Dressing/Undressing Lower body dressing/undressing steps patient completed: Thread/unthread right underwear leg;Thread/unthread left underwear leg;Pull underwear up/down;Thread/unthread right pants leg;Thread/unthread left pants leg;Pull pants up/down;Don/Doff right sock;Don/Doff left sock;Don/Doff right shoe;Don/Doff left shoe;Fasten/unfasten right shoe;Fasten/unfasten left shoe Lower body dressing/undressing: 4: Steadying Assist  FIM - Toileting Toileting steps completed by patient: Adjust clothing prior to toileting;Performs perineal hygiene;Adjust clothing after toileting Toileting Assistive Devices: Grab bar or rail for support Toileting: 4: Steadying assist  FIM - Diplomatic Services operational officer Devices: Bedside  commode Toilet Transfers: 4-To toilet/BSC: Min A (steadying Pt. > 75%);4-From toilet/BSC: Min A (steadying Pt. > 75%)  FIM - Banker Devices: Walker;Bed rails Bed/Chair Transfer: 4: Supine > Sit: Min A (steadying Pt. > 75%/lift 1 leg);4: Bed > Chair or W/C: Min A (steadying Pt. > 75%)  FIM - Locomotion: Wheelchair Distance: 30 Locomotion: Wheelchair: 0: Activity did not occur FIM - Locomotion: Ambulation Locomotion: Ambulation Assistive Devices: Fara Boros Ambulation/Gait Assistance: 4: Min assist Locomotion: Ambulation: 4: Travels 150 ft or more with minimal assistance (Pt.>75%)  Comprehension Comprehension Mode: Auditory Comprehension: 6-Follows complex conversation/direction: With extra time/assistive device  Expression Expression Mode: Verbal Expression: 6-Expresses complex ideas: With extra time/assistive device  Social Interaction Social Interaction: 6-Interacts appropriately with others with medication or extra time (anti-anxiety, antidepressant).  Problem Solving Problem Solving: 6-Solves complex problems: With extra time  Memory Memory: 6-More than reasonable amt  of time  1. 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 DVT Prophylaxis/Anticoagulation: Subcutaneous heparin. Monitor platelet counts any signs of bleeding  3. Diabetes mellitus. Hemoglobin A1c 6.5. Glucophage 500 mg twice a day. Check blood sugars a.c. and at bedtime  4. Neuropsych: This patient is capable of making decisions on his/her own behalf.  5. Hypertension. Coreg 40 mg daily, lisinopril 40 mg daily. Monitor with increased activity. Secondary prevention education  6. Hyperlipidemia. Zocor  7. Blindness right eye  8. Leukocytosis. White blood cell count 11.8 WBCs normalizing 9.  Constipation due to immobility will add senna 10.  Vomiting give metformin p breakfast   LOS (Days) 9 A FACE TO FACE EVALUATION WAS  PERFORMED  Kaycen Whitworth E 04/01/2012, 8:21 AM

## 2012-04-01 NOTE — Progress Notes (Signed)
Physical Therapy Session Note  Patient Details  Name: Lynn Weeks MRN: [id number] Date of Birth: Sep 20, 1918  Today's Date: 04/01/2012 Time: 3086-5784 Time Calculation (min): 58 min  Time:  14:45- 15:30 45 min  Short Term Goals: Same as LTGs  S:  Pt initially not wanting to do therapy due to episode of vomiting earlier this morning and now with a dull HA.  Pt also complaining of neck pain.  RN assisted in convincing pt to participate in therapy session.  Skilled Therapeutic Interventions/Progress Updates:   Pt used hot pack x 15 min on cervical region to relax muscles then performed gentle massage and stretching to cervical muscles.  Pt reported decrease in pain and dizziness.  Supine to sit with supervision.  Gait with RW x 150' with min@ on unit without increase in dizziness.   Therapy Documentation Precautions:  Precautions Precautions: Fall Precaution Comments: poor balance, leans to right Restrictions Weight Bearing Restrictions: No Pain: Pain Assessment Pain Assessment: No/denies pain Pain Score: 0-No pain Other Treatments:   Second Treatment:  Granddaughter present did family education for gait on level and grass surfaces, car transfer, and step training.  Pt requiring close supervision to occasional min@ with all activities, granddaughter verbalizing understanding.  Pt became sick and vomited at end of session, requiring her to be returned to the room. See FIM for current functional status  W/c goal d/c due to pt will be ambulatory at d/c. Therapy/Group: Individual Therapy  Georges Mouse 04/01/2012, 10:03 AM

## 2012-04-01 NOTE — Progress Notes (Signed)
Occupational Therapy Session Note  Patient Details  Name: Lynn Weeks MRN: [id number] Date of Birth: [birth year]  Today's Date: 04/01/2012 Time: [phone] and [phone] Time Calculation (min): 53 min and 27 min  Short Term Goals: Week 2:  OT Short Term Goal 1 (Week 2): STG = LTGs, supervision overall  Skilled Therapeutic Interventions/Progress Updates:    1)  Pt seen for ADL retraining with focus on functional ambulation with RW, transfers, visual compensation with bathing and dressing, and grooming tasks in standing.  Pt with no c/o pain or dizziness throughout first half of session, however ~5 mins of sitting and eating breakfast pt began vomiting.  Pt reported headache and dizziness post vomiting and within another ~5 mins reported feeling much better.  Prior to vomiting, pt completed bathing at shower level and dressing at sit to stand level with min/steady assist. Pt with 1 LOB backwards while pulling up pants, but able to steady self with legs against back of bed and use of RW to side.  Pt stood at sink for grooming tasks and was close supervision secondary to Rt lateral lean in standing.    2) 1:1 OT with focus on functional ambulation in ADL apartment with RW.  Pt's son and granddaughter present for session. Engaged in dry tub/shower transfer with use of tub bench.  Pt light min assist with ambulation with RW and cues for technique and safety with tub/shower transfer.  Engaged in simple kitchen activity with obtaining items from shelves above eye level and lower cabinets.  Educated pt on placement of RW when standing at sink to complete dishes and encouraged family members to be close when obtaining items from low cabinets due to increased fall risk and dizziness with change in position.  Therapy Documentation Precautions:  Precautions Precautions: Fall Precaution Comments: poor balance, leans to right Restrictions Weight Bearing Restrictions: No Pain:  Pt c/o headache.  RN  notified  See FIM for current functional status  Therapy/Group: Individual Therapy  Leonette Monarch 04/01/2012, 12:07 PM

## 2012-04-01 NOTE — Progress Notes (Signed)
Social Work Patient ID: Lynn Weeks, female   DOB: 1918/12/16, 78 y.o.   MRN: [id number] Met with pt and son who was here for lunch to inform team conference progression toward goals and discharge still 9/11. Pt reports she is doing well but very tired.  Son reports granddaughter will be in next week to attend therapies with pt. Son is impressed with pt's progress and is encouraging to Mom.  Continue to work toward discharge next Wed.

## 2012-04-02 ENCOUNTER — Inpatient Hospital Stay (HOSPITAL_COMMUNITY): Payer: Medicare Other | Admitting: Occupational Therapy

## 2012-04-02 ENCOUNTER — Inpatient Hospital Stay (HOSPITAL_COMMUNITY): Payer: Medicare Other | Admitting: *Deleted

## 2012-04-02 LAB — GLUCOSE, CAPILLARY
Glucose-Capillary: 114 mg/dL — ABNORMAL HIGH (ref 70–99)
Glucose-Capillary: 125 mg/dL — ABNORMAL HIGH (ref 70–99)
Glucose-Capillary: 125 mg/dL — ABNORMAL HIGH (ref 70–99)

## 2012-04-02 NOTE — Progress Notes (Signed)
Physical Therapy Session Note  Patient Details  Name: MYKELA MEWBORN MRN: [id number] Date of Birth: January 14, 1919  Today's Date: 04/02/2012 Time: 1415-1510 Time Calculation (min): 55 min  Short Term Goals: Week 2:  PT Short Term Goal 1 (Week 2): = LTG supervision overall   Skilled Therapeutic Interventions/Progress Updates:    NMR- vestibular and postural control training to improve balance for fall prevention and mobility; BIODEX LOS and catch game- improved from 15 % to 25% after two trials, stood on foam and hit ball to graddaughter having to scan for ball in different directions and work on self correcting LOB- pt with c/o unsteadiness- not well feeling with both activities but after seated rest break able to continue.  Still able to self correct any LOB gym to room with gait.  Discussed vestibular rehab with granddtr and she requests HHPT at this time d/t not knowing if pt will tolerate car rides d/t vestibular issues and vomiting episode and HA that both felt were related to being in sunlight yesterday  Therapy Documentation Precautions:  Precautions Precautions: Fall Precaution Comments: vestibular Restrictions Weight Bearing Restrictions: No Pain:no c/o pain       See FIM for current functional status  Therapy/Group: Individual Therapy  Michaelene Song 04/02/2012, 3:04 PM

## 2012-04-02 NOTE — Progress Notes (Signed)
Physical Therapy Session Note  Patient Details  Name: Lynn Weeks MRN: [id number] Date of Birth: Jan 08, 1919  Today's Date: 04/02/2012 Time: 0900-1000 Time Calculation (min): 60 min  Short Term Goals: Week 2:  PT Short Term Goal 1 (Week 2): = LTG supervision overall   Skilled Therapeutic Interventions/Progress Updates:  Pt felt she overdid it yesterday by going outdoors; also reports she thinks headaches have been worse since heat used on neck    therapeutic activity- practiced car transfer, bed mobility on regular bed, transfers from w/c and furniture- cues for hand placement but S  Nustep level 4 for activity tolerance goal MET >=2 15 minutes  Gait training- household- instructed on sidestepping and backing up- needed cues to not move device and feet at same time to prevent fall, pt now close S with RW and agrees she will use a RW at home (needs RW ordered, has SW, She will discuss w/c rental with family) She initially leaned to R but self corrected today without physical A or verbal cues.  Pt does state she feels world is moving sometimes (vestibular) but no c/o nauseau today. She feels HA are also related to overexertion.  Therapy Documentation Precautions:  Precautions Precautions: Fall Precaution Comments: vestibular Restrictions Weight Bearing Restrictions: No    Pain: "mild HA", premedicated Mobility: Bed Mobility Supine to Sit: 7: Independent Sit to Supine: 7: Independent Transfers Sit to Stand: 5: Supervision Sit to Stand Details: Verbal cues for precautions/safety Stand to Sit: 5: Supervision Stand to Sit Details (indicate cue type and reason): Verbal cues for precautions/safety Stand to Sit Details: leans R , cues to reach back with R hand for safety Locomotion : Ambulation Ambulation/Gait Assistance: 5: Supervision Ambulation Distance (Feet): 120 Feet Assistive device: Rolling walker Ambulation/Gait Assistance Details: Verbal cues for technique;Verbal  cues for safe use of DME/AE Gait Gait Pattern: Step-through pattern High Level Ambulation High Level Ambulation: Side stepping;Backwards walking  Balance: S static balance with BUE support   Exercises: Cardiovascular Exercises NuStep: level 4 Other Treatments: Treatments Therapeutic Activity: car transfer S stand pivot with RW  See FIM for current functional status  Therapy/Group: Individual Therapy  Michaelene Song 04/02/2012, 9:52 AM

## 2012-04-02 NOTE — Progress Notes (Signed)
Patient ID: Lynn Weeks, female   DOB: 1919/03/21, 78 y.o.   MRN: [id number] Lynn Weeks is a 78 y.o. right-handed female with history of type 2 diabetes mellitus, hypertension, coronary artery disease with pacemaker as well his history of inner ear problems. Patient lives alone and was independent prior to admission driving short distances. Admitted 03/20/2012 with near syncope and dizziness. Cranial CT scan with no acute changes. Echocardiogram pending. Cardiac panel was negative. Carotid Dopplers with 40-59% right ICA stenosis.Marland Kitchen MRI of the brain not done secondary to pacemaker. Noted admission white blood cell count of 19,200 as well as review old records showing white blood cell count 19,800 in October of 2012. Chest x-ray was negative for infiltrate and urinalysis study negative with latest white blood cell count 15,500 on 03/20/2012 and monitored. Placed on scheduled Antivert 25 mg twice a day for dizziness. Neurology services consulted suspect possible right cerebellar infarcts not seen on initial cranial CT scan. Placed on Plavix therapy  Subjective/Complaints: Vomited after pills Review of Systems  Neurological: Positive for dizziness.  All other systems reviewed and are negative.    Objective: Vital Signs: Blood pressure 140/57, pulse 65, temperature 98.3 F (36.8 C), temperature source Oral, resp. rate 18, weight 43.8 kg (96 lb 9 oz), SpO2 95.00%. No results found. Results for orders placed during the hospital encounter of 03/23/12 (from the past 72 hour(s))  GLUCOSE, CAPILLARY     Status: Abnormal   Collection Time   03/30/12 11:50 AM      Component Value Range Comment   Glucose-Capillary 107 (*) 70 - 99 mg/dL    Comment 1 Notify RN      Comment 2 Documented in Chart     GLUCOSE, CAPILLARY     Status: Abnormal   Collection Time   03/30/12  4:37 PM      Component Value Range Comment   Glucose-Capillary 172 (*) 70 - 99 mg/dL   GLUCOSE, CAPILLARY     Status: Normal   Collection  Time   03/30/12  9:06 PM      Component Value Range Comment   Glucose-Capillary 96  70 - 99 mg/dL   GLUCOSE, CAPILLARY     Status: Abnormal   Collection Time   03/31/12  7:20 AM      Component Value Range Comment   Glucose-Capillary 114 (*) 70 - 99 mg/dL    Comment 1 Notify RN     GLUCOSE, CAPILLARY     Status: Abnormal   Collection Time   03/31/12 11:40 AM      Component Value Range Comment   Glucose-Capillary 115 (*) 70 - 99 mg/dL    Comment 1 Notify RN     GLUCOSE, CAPILLARY     Status: Abnormal   Collection Time   03/31/12  4:19 PM      Component Value Range Comment   Glucose-Capillary 134 (*) 70 - 99 mg/dL    Comment 1 Notify RN     GLUCOSE, CAPILLARY     Status: Abnormal   Collection Time   03/31/12  9:00 PM      Component Value Range Comment   Glucose-Capillary 102 (*) 70 - 99 mg/dL   GLUCOSE, CAPILLARY     Status: Abnormal   Collection Time   04/01/12  7:25 AM      Component Value Range Comment   Glucose-Capillary 136 (*) 70 - 99 mg/dL   GLUCOSE, CAPILLARY     Status: Abnormal   Collection Time  04/01/12 11:16 AM      Component Value Range Comment   Glucose-Capillary 145 (*) 70 - 99 mg/dL    Comment 1 Documented in Chart     GLUCOSE, CAPILLARY     Status: Abnormal   Collection Time   04/01/12  4:06 PM      Component Value Range Comment   Glucose-Capillary 117 (*) 70 - 99 mg/dL    Comment 1 Notify RN      Comment 2 Documented in Chart     GLUCOSE, CAPILLARY     Status: Abnormal   Collection Time   04/01/12  8:45 PM      Component Value Range Comment   Glucose-Capillary 114 (*) 70 - 99 mg/dL   GLUCOSE, CAPILLARY     Status: Abnormal   Collection Time   04/02/12  7:19 AM      Component Value Range Comment   Glucose-Capillary 114 (*) 70 - 99 mg/dL      HEENT: R lens opacification Cardio: RRR Resp: CTA B/L GI: BS positive Extremity:  No Edema Skin:   Intact Neuro: Alert/Oriented, Anxious, Cranial Nerve II-XII normal, Normal Sensory, Normal Motor and Other fair static  sitting balance Musc/Skel:  Other Bilateral shoulder contracture   Assessment/Plan: 1. Functional deficits secondary to Thrombotic R cerebellar infarct which require 3+ hours per day of interdisciplinary therapy in a comprehensive inpatient rehab setting. Physiatrist is providing close team supervision and 24 hour management of active medical problems listed below. Physiatrist and rehab team continue to assess barriers to discharge/monitor patient progress toward functional and medical goals. FIM: FIM - Bathing Bathing Steps Patient Completed: Chest;Right Arm;Left Arm;Abdomen;Front perineal area;Buttocks;Right upper leg;Left upper leg;Right lower leg (including foot);Left lower leg (including foot) Bathing: 4: Steadying assist  FIM - Upper Body Dressing/Undressing Upper body dressing/undressing steps patient completed: Thread/unthread right bra strap;Thread/unthread left bra strap;Thread/unthread right sleeve of pullover shirt/dresss;Thread/unthread left sleeve of pullover shirt/dress;Put head through opening of pull over shirt/dress;Pull shirt over trunk Upper body dressing/undressing: 4: Min-Patient completed 75 plus % of tasks FIM - Lower Body Dressing/Undressing Lower body dressing/undressing steps patient completed: Thread/unthread right underwear leg;Thread/unthread left underwear leg;Pull underwear up/down;Thread/unthread right pants leg;Thread/unthread left pants leg;Pull pants up/down;Don/Doff right sock;Don/Doff left sock;Don/Doff right shoe;Don/Doff left shoe;Fasten/unfasten right shoe;Fasten/unfasten left shoe Lower body dressing/undressing: 4: Steadying Assist  FIM - Toileting Toileting steps completed by patient: Adjust clothing prior to toileting;Performs perineal hygiene;Adjust clothing after toileting Toileting Assistive Devices: Grab bar or rail for support Toileting: 5: Supervision: Safety issues/verbal cues  FIM - Diplomatic Services operational officer Devices:  Bedside commode Toilet Transfers: 4-To toilet/BSC: Min A (steadying Pt. > 75%);4-From toilet/BSC: Min A (steadying Pt. > 75%)  FIM - Banker Devices: Walker;Bed rails Bed/Chair Transfer: 4: Supine > Sit: Min A (steadying Pt. > 75%/lift 1 leg);4: Bed > Chair or W/C: Min A (steadying Pt. > 75%)  FIM - Locomotion: Wheelchair Distance: 30 Locomotion: Wheelchair: 0: Activity did not occur FIM - Locomotion: Ambulation Locomotion: Ambulation Assistive Devices: Fara Boros Ambulation/Gait Assistance: 4: Min assist Locomotion: Ambulation: 4: Travels 150 ft or more with minimal assistance (Pt.>75%)  Comprehension Comprehension Mode: Auditory Comprehension: 6-Follows complex conversation/direction: With extra time/assistive device  Expression Expression Mode: Verbal Expression: 6-Expresses complex ideas: With extra time/assistive device  Social Interaction Social Interaction: 6-Interacts appropriately with others with medication or extra time (anti-anxiety, antidepressant).  Problem Solving Problem Solving: 6-Solves complex problems: With extra time  Memory Memory: 6-More than reasonable amt of  time  1. 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 DVT Prophylaxis/Anticoagulation: Subcutaneous heparin. Monitor platelet counts any signs of bleeding  3. Diabetes mellitus. Hemoglobin A1c 6.5. Glucophage 500 mg twice a day. Check blood sugars a.c. and at bedtime  4. Neuropsych: This patient is capable of making decisions on his/her own behalf.  5. Hypertension. Coreg 40 mg daily, lisinopril 40 mg daily. Monitor with increased activity. Secondary prevention education  6. Hyperlipidemia. Zocor  7. Blindness right eye  8. Leukocytosis. White blood cell count 11.8 WBCs normalizing 9.  Constipation due to immobility will add senna 10.  Vomiting give metformin p breakfast   LOS (Days) 10 A FACE TO FACE  EVALUATION WAS PERFORMED  Lynn Weeks 04/02/2012, 8:03 AM

## 2012-04-02 NOTE — Progress Notes (Signed)
Social Work Patient ID: Lynn Weeks, female   DOB: Apr 26, 1919, 78 y.o.   MRN: [id number] Pt requires a lightweight wheelchair to self propel and uses for her self care needs.  She is unable to self propel in a standard weight wheelchair. Family here to begin family education.  Work toward discharge 9/11.

## 2012-04-02 NOTE — Progress Notes (Signed)
Occupational Therapy Session Note  Patient Details  Name: Lynn Weeks MRN: [id number] Date of Birth: [birth year]  Today's Date: 04/02/2012 Time: 4098-1191 Time Calculation (min): 45 min  Short Term Goals: Week 2:  OT Short Term Goal 1 (Week 2): STG = LTGs, supervision overall  Skilled Therapeutic Interventions/Progress Updates:    Pt seen for ADL retraining with focus on functional mobility with RW, dynamic sitting and standing balance, and increased independence with self-care tasks of bathing and dressing.  Pt gathered clothes prior to dressing with use of RW for stability, completed dressing with overall supervision with stand by assist in standing to pull up pants; pt with 1 LOB backwards with dressing able to correct self and slowly lower self to bed. Pt completed grooming in standing at sink without any LOB.  No reports of dizziness this session.  Therapy Documentation Precautions:  Precautions Precautions: Fall Precaution Comments: vestibular Restrictions Weight Bearing Restrictions: No Pain: Pain Assessment Pain Assessment: No/denies pain Pain Score:   4 Pain Descriptors: Headache  See FIM for current functional status  Therapy/Group: Individual Therapy  Leonette Monarch 04/02/2012, 10:22 AM

## 2012-04-02 NOTE — Progress Notes (Signed)
Occupational Therapy Session Note  Patient Details  Name: ROSEBUD KOENEN MRN: [id number] Date of Birth: 1918/12/06  Today's Date: 04/02/2012 Time: 1335-1400 Time Calculation (min): 25 min   Skilled Therapeutic Interventions/Progress Updates: Patient participated in 1:1 OT session this pm to address functional mobility and safety with rolling walker in apartment / home living situation.  Problem solved with patient and family member regarding shower set up, and possibility of using a bedside commode next to her bed as she has had frequent falls from getting up at night to use the bathroom.  Family member had specific questions regarding grab bar placement in shower, and height os hand held shower head.  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  Initially patient had a significant loss of balance with right turn, and dropped suddenly to chair.  With subsequent attempts patient slowed self down and controlled descent into chair.       Therapy Documentation Precautions:  Precautions Precautions: Fall Precaution Comments: vestibular Restrictions Weight Bearing Restrictions: No  Pain:  No report of pain   See FIM for current functional status  Therapy/Group: Individual Therapy  Collier Salina 04/02/2012, 4:11 PM

## 2012-04-03 ENCOUNTER — Inpatient Hospital Stay (HOSPITAL_COMMUNITY): Payer: Medicare Other | Admitting: *Deleted

## 2012-04-03 DIAGNOSIS — I6330 Cerebral infarction due to thrombosis of unspecified cerebral artery: Secondary | ICD-10-CM

## 2012-04-03 DIAGNOSIS — I69993 Ataxia following unspecified cerebrovascular disease: Secondary | ICD-10-CM

## 2012-04-03 DIAGNOSIS — Z5189 Encounter for other specified aftercare: Secondary | ICD-10-CM

## 2012-04-03 LAB — GLUCOSE, CAPILLARY
Glucose-Capillary: 115 mg/dL — ABNORMAL HIGH (ref 70–99)
Glucose-Capillary: 152 mg/dL — ABNORMAL HIGH (ref 70–99)
Glucose-Capillary: 93 mg/dL (ref 70–99)

## 2012-04-03 NOTE — Progress Notes (Addendum)
Physical Therapy Note  Patient Details  Name: Lynn Weeks MRN: [id number] Date of Birth: Jun 06, 1919 Today's Date: 04/03/2012  1300-1355 (55 Minutes) group Pain: no complaint of pain Pt participated in PT group session focused on gait training/safety/endurance. Pt ambulates 160 feet X 2 with RW close supervision with intermittent stagger to right > when changing direction with one partial loss of balance.  Maryella Abood,JIM 04/03/2012, 8:43 AM

## 2012-04-03 NOTE — Progress Notes (Signed)
Patient ID: Lynn Weeks, female   DOB: 01/25/1919, 78 y.o.   MRN: [id number] Elbert Ewings Dangerfield is a 78 y.o. right-handed female with history of type 2 diabetes mellitus, hypertension, coronary artery disease with pacemaker as well his history of inner ear problems. Patient lives alone and was independent prior to admission driving short distances. Admitted 03/20/2012 with near syncope and dizziness. Cranial CT scan with no acute changes. Echocardiogram pending. Cardiac panel was negative. Carotid Dopplers with 40-59% right ICA stenosis.Marland Kitchen MRI of the brain not done secondary to pacemaker. Noted admission white blood cell count of 19,200 as well as review old records showing white blood cell count 19,800 in October of 2012. Chest x-ray was negative for infiltrate and urinalysis study negative with latest white blood cell count 15,500 on 03/20/2012 and monitored. Placed on scheduled Antivert 25 mg twice a day for dizziness. Neurology services consulted suspect possible right cerebellar infarcts not seen on initial cranial CT scan. Placed on Plavix therapy  Subjective/Complaints: Vomiting and nausea gradually improving Review of Systems  Neurological: Positive for dizziness.  All other systems reviewed and are negative.    Objective: Vital Signs: Blood pressure 133/43, pulse 75, temperature 97.8 F (36.6 C), temperature source Oral, resp. rate 18, weight 43.8 kg (96 lb 9 oz), SpO2 95.00%. No results found. Results for orders placed during the hospital encounter of 03/23/12 (from the past 72 hour(s))  GLUCOSE, CAPILLARY     Status: Abnormal   Collection Time   03/31/12  7:20 AM      Component Value Range Comment   Glucose-Capillary 114 (*) 70 - 99 mg/dL    Comment 1 Notify RN     GLUCOSE, CAPILLARY     Status: Abnormal   Collection Time   03/31/12 11:40 AM      Component Value Range Comment   Glucose-Capillary 115 (*) 70 - 99 mg/dL    Comment 1 Notify RN     GLUCOSE, CAPILLARY     Status: Abnormal   Collection Time   03/31/12  4:19 PM      Component Value Range Comment   Glucose-Capillary 134 (*) 70 - 99 mg/dL    Comment 1 Notify RN     GLUCOSE, CAPILLARY     Status: Abnormal   Collection Time   03/31/12  9:00 PM      Component Value Range Comment   Glucose-Capillary 102 (*) 70 - 99 mg/dL   GLUCOSE, CAPILLARY     Status: Abnormal   Collection Time   04/01/12  7:25 AM      Component Value Range Comment   Glucose-Capillary 136 (*) 70 - 99 mg/dL   GLUCOSE, CAPILLARY     Status: Abnormal   Collection Time   04/01/12 11:16 AM      Component Value Range Comment   Glucose-Capillary 145 (*) 70 - 99 mg/dL    Comment 1 Documented in Chart     GLUCOSE, CAPILLARY     Status: Abnormal   Collection Time   04/01/12  4:06 PM      Component Value Range Comment   Glucose-Capillary 117 (*) 70 - 99 mg/dL    Comment 1 Notify RN      Comment 2 Documented in Chart     GLUCOSE, CAPILLARY     Status: Abnormal   Collection Time   04/01/12  8:45 PM      Component Value Range Comment   Glucose-Capillary 114 (*) 70 - 99 mg/dL   GLUCOSE,  CAPILLARY     Status: Abnormal   Collection Time   04/02/12  7:19 AM      Component Value Range Comment   Glucose-Capillary 114 (*) 70 - 99 mg/dL   GLUCOSE, CAPILLARY     Status: Abnormal   Collection Time   04/02/12 11:25 AM      Component Value Range Comment   Glucose-Capillary 125 (*) 70 - 99 mg/dL   GLUCOSE, CAPILLARY     Status: Abnormal   Collection Time   04/02/12  4:03 PM      Component Value Range Comment   Glucose-Capillary 125 (*) 70 - 99 mg/dL    Comment 1 Notify RN     GLUCOSE, CAPILLARY     Status: Abnormal   Collection Time   04/02/12  9:09 PM      Component Value Range Comment   Glucose-Capillary 119 (*) 70 - 99 mg/dL    Comment 1 Notify RN        HEENT: R lens opacification Cardio: RRR Resp: CTA B/L GI: BS positive Extremity:  No Edema Skin:   Intact Neuro: Alert/Oriented, Anxious, Cranial Nerve II-XII normal, Normal Sensory, Normal Motor and Other  fair static sitting balance. Mild right limb ataxia Musc/Skel:  Other Bilateral shoulder contracture   Assessment/Plan: 1. Functional deficits secondary to Thrombotic R cerebellar infarct which require 3+ hours per day of interdisciplinary therapy in a comprehensive inpatient rehab setting. Physiatrist is providing close team supervision and 24 hour management of active medical problems listed below. Physiatrist and rehab team continue to assess barriers to discharge/monitor patient progress toward functional and medical goals. FIM: FIM - Bathing Bathing Steps Patient Completed: Chest;Right Arm;Left Arm;Abdomen;Front perineal area;Buttocks;Right upper leg;Left upper leg;Right lower leg (including foot);Left lower leg (including foot) Bathing: 4: Steadying assist  FIM - Upper Body Dressing/Undressing Upper body dressing/undressing steps patient completed: Thread/unthread right bra strap;Thread/unthread left bra strap;Thread/unthread right sleeve of pullover shirt/dresss;Thread/unthread left sleeve of pullover shirt/dress;Put head through opening of pull over shirt/dress;Pull shirt over trunk Upper body dressing/undressing: 4: Min-Patient completed 75 plus % of tasks FIM - Lower Body Dressing/Undressing Lower body dressing/undressing steps patient completed: Thread/unthread right underwear leg;Thread/unthread left underwear leg;Pull underwear up/down;Thread/unthread right pants leg;Thread/unthread left pants leg;Pull pants up/down;Don/Doff right sock;Don/Doff left sock;Don/Doff right shoe;Don/Doff left shoe;Fasten/unfasten right shoe;Fasten/unfasten left shoe Lower body dressing/undressing: 4: Steadying Assist  FIM - Toileting Toileting steps completed by patient: Adjust clothing prior to toileting;Performs perineal hygiene;Adjust clothing after toileting Toileting Assistive Devices: Grab bar or rail for support Toileting: 5: Supervision: Safety issues/verbal cues  FIM - Ambulance person Devices: Bedside commode Toilet Transfers: 4-To toilet/BSC: Min A (steadying Pt. > 75%);4-From toilet/BSC: Min A (steadying Pt. > 75%)  FIM - Banker Devices: Walker;Arm rests Bed/Chair Transfer: 7: Supine > Sit: No assist;5: Bed > Chair or W/C: Supervision (verbal cues/safety issues);7: Sit > Supine: No assist;5: Chair or W/C > Bed: Supervision (verbal cues/safety issues)  FIM - Locomotion: Wheelchair Distance: 30 Locomotion: Wheelchair: 1: Total Assistance/staff pushes wheelchair (Pt<25%) FIM - Locomotion: Ambulation Locomotion: Ambulation Assistive Devices: Designer, industrial/product Ambulation/Gait Assistance: 5: Supervision Locomotion: Ambulation: 2: Travels 50 - 149 ft with supervision/safety issues  Comprehension Comprehension Mode: Auditory Comprehension: 5-Understands complex 90% of the time/Cues < 10% of the time  Expression Expression Mode: Verbal Expression: 5-Expresses complex 90% of the time/cues < 10% of the time  Social Interaction Social Interaction: 6-Interacts appropriately with others with medication or extra time (anti-anxiety, antidepressant).  Problem Solving Problem Solving: 5-Solves complex 90% of the time/cues < 10% of the time  Memory Memory: 5-Recognizes or recalls 90% of the time/requires cueing < 10% of the time  1. 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 DVT Prophylaxis/Anticoagulation: Subcutaneous heparin. Monitor platelet counts any signs of bleeding  3. Diabetes mellitus. Hemoglobin A1c 6.5. Glucophage 500 mg twice a day. Check blood sugars a.c. and at bedtime  4. Neuropsych: This patient is capable of making decisions on his/her own behalf.  5. Hypertension. Coreg 40 mg daily, lisinopril 40 mg daily. Monitor with increased activity. Secondary prevention education  6. Hyperlipidemia. Zocor  7. Blindness right eye  8.  Leukocytosis. White blood cell count 11.8 WBCs normalizing 9.  Constipation due to immobility will add senna 10.  Vomiting give metformin p breakfast. improving   LOS (Days) 11 A FACE TO FACE EVALUATION WAS PERFORMED  SWARTZ,ZACHARY T 04/03/2012, 7:01 AM

## 2012-04-04 ENCOUNTER — Inpatient Hospital Stay (HOSPITAL_COMMUNITY): Payer: Medicare Other | Admitting: Physical Therapy

## 2012-04-04 LAB — GLUCOSE, CAPILLARY
Glucose-Capillary: 152 mg/dL — ABNORMAL HIGH (ref 70–99)
Glucose-Capillary: 103 mg/dL — ABNORMAL HIGH (ref 70–99)

## 2012-04-04 NOTE — Progress Notes (Signed)
Physical Therapy Note  Patient Details  Name: Lynn Weeks MRN: [id number] Date of Birth: 1919-06-11 Today's Date: 04/04/2012  Time: 1345-1445 60 minutes  No c/o pain.  Pt participated in group gait training therapy with close supervision with RW for gait in controlled environment, min A for household environment and obstacle negotiation.  Pt negotiated stairs with B handrails with close supervision.  Nustep for LE strength and endurance x 10 minutes.  Group therapy   DONAWERTH,KAREN 04/04/2012, 4:33 PM

## 2012-04-04 NOTE — Progress Notes (Signed)
Patient ID: Lynn Weeks, female   DOB: 1918-08-10, 78 y.o.   MRN: [id number] Lynn Weeks is a 78 y.o. right-handed female with history of type 2 diabetes mellitus, hypertension, coronary artery disease with pacemaker as well his history of inner ear problems. Patient lives alone and was independent prior to admission driving short distances. Admitted 03/20/2012 with near syncope and dizziness. Cranial CT scan with no acute changes. Echocardiogram pending. Cardiac panel was negative. Carotid Dopplers with 40-59% right ICA stenosis.Marland Kitchen MRI of the brain not done secondary to pacemaker. Noted admission white blood cell count of 19,200 as well as review old records showing white blood cell count 19,800 in October of 2012. Chest x-ray was negative for infiltrate and urinalysis study negative with latest white blood cell count 15,500 on 03/20/2012 and monitored. Placed on scheduled Antivert 25 mg twice a day for dizziness. Neurology services consulted suspect possible right cerebellar infarcts not seen on initial cranial CT scan. Placed on Plavix therapy  Subjective/Complaints: Vomiting and nausea gradually improving. No complaints today. Slept well. Review of Systems  Neurological: Positive for dizziness.  All other systems reviewed and are negative.    Objective: Vital Signs: Blood pressure 116/67, pulse 69, temperature 98.1 F (36.7 C), temperature source Oral, resp. rate 16, weight 43.8 kg (96 lb 9 oz), SpO2 90.00%. No results found. Results for orders placed during the hospital encounter of 03/23/12 (from the past 72 hour(s))  GLUCOSE, CAPILLARY     Status: Abnormal   Collection Time   04/01/12  7:25 AM      Component Value Range Comment   Glucose-Capillary 136 (*) 70 - 99 mg/dL   GLUCOSE, CAPILLARY     Status: Abnormal   Collection Time   04/01/12 11:16 AM      Component Value Range Comment   Glucose-Capillary 145 (*) 70 - 99 mg/dL    Comment 1 Documented in Chart     GLUCOSE, CAPILLARY      Status: Abnormal   Collection Time   04/01/12  4:06 PM      Component Value Range Comment   Glucose-Capillary 117 (*) 70 - 99 mg/dL    Comment 1 Notify RN      Comment 2 Documented in Chart     GLUCOSE, CAPILLARY     Status: Abnormal   Collection Time   04/01/12  8:45 PM      Component Value Range Comment   Glucose-Capillary 114 (*) 70 - 99 mg/dL   GLUCOSE, CAPILLARY     Status: Abnormal   Collection Time   04/02/12  7:19 AM      Component Value Range Comment   Glucose-Capillary 114 (*) 70 - 99 mg/dL   GLUCOSE, CAPILLARY     Status: Abnormal   Collection Time   04/02/12 11:25 AM      Component Value Range Comment   Glucose-Capillary 125 (*) 70 - 99 mg/dL   GLUCOSE, CAPILLARY     Status: Abnormal   Collection Time   04/02/12  4:03 PM      Component Value Range Comment   Glucose-Capillary 125 (*) 70 - 99 mg/dL    Comment 1 Notify RN     GLUCOSE, CAPILLARY     Status: Abnormal   Collection Time   04/02/12  9:09 PM      Component Value Range Comment   Glucose-Capillary 119 (*) 70 - 99 mg/dL    Comment 1 Notify RN     GLUCOSE, CAPILLARY  Status: Abnormal   Collection Time   04/03/12  7:25 AM      Component Value Range Comment   Glucose-Capillary 115 (*) 70 - 99 mg/dL   GLUCOSE, CAPILLARY     Status: Abnormal   Collection Time   04/03/12 11:10 AM      Component Value Range Comment   Glucose-Capillary 152 (*) 70 - 99 mg/dL    Comment 1 Notify RN     GLUCOSE, CAPILLARY     Status: Normal   Collection Time   04/03/12  4:47 PM      Component Value Range Comment   Glucose-Capillary 93  70 - 99 mg/dL   GLUCOSE, CAPILLARY     Status: Abnormal   Collection Time   04/03/12  8:37 PM      Component Value Range Comment   Glucose-Capillary 112 (*) 70 - 99 mg/dL    Comment 1 Notify RN        HEENT: R lens opacification Cardio: RRR Resp: CTA B/L GI: BS positive Extremity:  No Edema Skin:   Intact Neuro: Alert/Oriented, Anxious, Cranial Nerve II-XII normal, Normal Sensory, Normal Motor and  Other fair static sitting balance. Mild right limb ataxia Musc/Skel:  Other Bilateral shoulder contracture   Assessment/Plan: 1. Functional deficits secondary to Thrombotic R cerebellar infarct which require 3+ hours per day of interdisciplinary therapy in a comprehensive inpatient rehab setting. Physiatrist is providing close team supervision and 24 hour management of active medical problems listed below. Physiatrist and rehab team continue to assess barriers to discharge/monitor patient progress toward functional and medical goals. FIM: FIM - Bathing Bathing Steps Patient Completed: Chest;Right Arm;Left Arm;Abdomen;Front perineal area;Buttocks;Right upper leg;Left upper leg;Right lower leg (including foot);Left lower leg (including foot) Bathing: 4: Steadying assist  FIM - Upper Body Dressing/Undressing Upper body dressing/undressing steps patient completed: Thread/unthread right bra strap;Thread/unthread left bra strap;Thread/unthread right sleeve of pullover shirt/dresss;Thread/unthread left sleeve of pullover shirt/dress;Put head through opening of pull over shirt/dress;Pull shirt over trunk Upper body dressing/undressing: 4: Min-Patient completed 75 plus % of tasks FIM - Lower Body Dressing/Undressing Lower body dressing/undressing steps patient completed: Thread/unthread right underwear leg;Thread/unthread left underwear leg;Pull underwear up/down;Thread/unthread right pants leg;Thread/unthread left pants leg;Pull pants up/down;Don/Doff right sock;Don/Doff left sock;Don/Doff right shoe;Don/Doff left shoe;Fasten/unfasten right shoe;Fasten/unfasten left shoe Lower body dressing/undressing: 4: Steadying Assist  FIM - Toileting Toileting steps completed by patient: Adjust clothing prior to toileting;Performs perineal hygiene;Adjust clothing after toileting Toileting Assistive Devices: Grab bar or rail for support Toileting: 4: Steadying assist  FIM - Scientist, research (physical sciences) Devices: Art gallery manager Transfers: 4-To toilet/BSC: Min A (steadying Pt. > 75%);4-From toilet/BSC: Min A (steadying Pt. > 75%)  FIM - Banker Devices: Walker;Arm rests Bed/Chair Transfer: 7: Supine > Sit: No assist;5: Bed > Chair or W/C: Supervision (verbal cues/safety issues);7: Sit > Supine: No assist;5: Chair or W/C > Bed: Supervision (verbal cues/safety issues)  FIM - Locomotion: Wheelchair Distance: 30 Locomotion: Wheelchair: 1: Total Assistance/staff pushes wheelchair (Pt<25%) FIM - Locomotion: Ambulation Locomotion: Ambulation Assistive Devices: Designer, industrial/product Ambulation/Gait Assistance: 5: Supervision Locomotion: Ambulation: 2: Travels 50 - 149 ft with supervision/safety issues  Comprehension Comprehension Mode: Auditory Comprehension: 6-Follows complex conversation/direction: With extra time/assistive device  Expression Expression Mode: Verbal Expression: 6-Expresses complex ideas: With extra time/assistive device  Social Interaction Social Interaction: 6-Interacts appropriately with others with medication or extra time (anti-anxiety, antidepressant).  Problem Solving Problem Solving: 5-Solves complex 90% of the time/cues < 10% of the time  Memory Memory: 5-Recognizes or recalls 90% of the time/requires cueing < 10% of the time  1. 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 DVT Prophylaxis/Anticoagulation: Subcutaneous heparin. Monitor platelet counts any signs of bleeding  3. Diabetes mellitus. Hemoglobin A1c 6.5. Glucophage 500 mg twice a day. Check blood sugars a.c. and at bedtime  4. Neuropsych: This patient is capable of making decisions on his/her own behalf.  5. Hypertension. Coreg 40 mg daily, lisinopril 40 mg daily. Monitor with increased activity. Secondary prevention education  6. Hyperlipidemia. Zocor  7. Blindness right eye  8. Leukocytosis. White blood cell  count 11.8 WBCs normalizing 9.  Constipation due to immobility will add senna 10.  Vomiting give metformin p breakfast.--improving   LOS (Days) 12 A FACE TO FACE EVALUATION WAS PERFORMED  Yuval Nolet T 04/04/2012, 7:16 AM

## 2012-04-05 ENCOUNTER — Inpatient Hospital Stay (HOSPITAL_COMMUNITY): Payer: Medicare Other | Admitting: Physical Therapy

## 2012-04-05 ENCOUNTER — Inpatient Hospital Stay (HOSPITAL_COMMUNITY): Payer: Medicare Other | Admitting: Occupational Therapy

## 2012-04-05 ENCOUNTER — Inpatient Hospital Stay (HOSPITAL_COMMUNITY): Payer: Medicare Other

## 2012-04-05 DIAGNOSIS — Z5189 Encounter for other specified aftercare: Secondary | ICD-10-CM

## 2012-04-05 DIAGNOSIS — I6330 Cerebral infarction due to thrombosis of unspecified cerebral artery: Secondary | ICD-10-CM

## 2012-04-05 DIAGNOSIS — I69993 Ataxia following unspecified cerebrovascular disease: Secondary | ICD-10-CM

## 2012-04-05 LAB — GLUCOSE, CAPILLARY
Glucose-Capillary: 125 mg/dL — ABNORMAL HIGH (ref 70–99)
Glucose-Capillary: 132 mg/dL — ABNORMAL HIGH (ref 70–99)

## 2012-04-05 NOTE — Progress Notes (Signed)
Occupational Therapy Session Note  Patient Details  Name: Lynn Weeks MRN: [id number] Date of Birth: September 16, 1918  Today's Date: 04/05/2012 Time: 0730-0815 Time Calculation (min): 45 min  Short Term Goals: Week 2:  OT Short Term Goal 1 (Week 2): STG = LTGs, supervision overall  Skilled Therapeutic Interventions/Progress Updates:    Pt seen for ADL retraining with focus on functional mobility with RW, visual compensation strategies to decrease dizziness, and increased independence with self-care tasks.  Pt completed functional transfers with close supervision and grooming and dressing tasks with supervision.  Pt with 1 bout of severe nausea with dry heaving but no vomiting. Pt's granddaughter present for most of session and education both on gaze stabilization with self-care tasks and taking breaks throughout tasks if dizziness and nausea increases.  Therapy Documentation Precautions:  Precautions Precautions: Fall Precaution Comments: vestibular Restrictions Weight Bearing Restrictions: No Pain: Pain Assessment Pain Assessment: No/denies pain Pain Score: 0-No pain  See FIM for current functional status  Therapy/Group: Individual Therapy  Leonette Monarch 04/05/2012, 8:52 AM

## 2012-04-05 NOTE — Progress Notes (Signed)
Occupational Therapy Session Note  Patient Details  Name: Lynn Weeks MRN: [id number] Date of Birth: 04-Jun-1919  Today's Date: 04/05/2012 Time: 1040-1110 Time Calculation (min): 30 min  Short Term Goals: Week 2:  OT Short Term Goal 1 (Week 2): STG = LTGs, supervision overall  Skilled Therapeutic Interventions/Progress Updates:  Patient complained of back pain but was agreeable to participate in tx session. Patient donned shoes with supervision with assist to fasten. Patient worked on standing balance while brushing her hair at sink with RW. Patient ambulated from bedroom to gym and back with RW at supervision. Educated patient on walking speed to help control walker. -2lb weighted bar; Bil UE; 2 sets; 10 reps; seated; to increase strength. Rest breaks when needed.  Therapy Documentation Precautions:  Precautions Precautions: Fall Precaution Comments: vestibular Restrictions Weight Bearing Restrictions: No Pain: Pain Assessment Pain Assessment: No/denies pain Pain Score:   3 (returning from therapy) Pain Type: Acute pain Pain Location: Back Pain Descriptors: Aching Patients Stated Pain Goal: 3 Pain Intervention(s): Medication (See eMAR)  See FIM for current functional status  Therapy/Group: Individual Therapy   Limmie Patricia, OTR/L 04/05/2012, 11:38 AM

## 2012-04-05 NOTE — Progress Notes (Signed)
Patient ID: Lynn Weeks, female   DOB: March 18, 1919, 78 y.o.   MRN: [id number] Patient ID: Lynn Weeks, female   DOB: Jan 22, 1919, 78 y.o.   MRN: [id number] Lynn Weeks is a 78 y.o. right-handed female with history of type 2 diabetes mellitus, hypertension, coronary artery disease with pacemaker as well his history of inner ear problems. Patient lives alone and was independent prior to admission driving short distances. Admitted 03/20/2012 with near syncope and dizziness. Cranial CT scan with no acute changes. Echocardiogram pending. Cardiac panel was negative. Carotid Dopplers with 40-59% right ICA stenosis.Marland Kitchen MRI of the brain not done secondary to pacemaker. Noted admission white blood cell count of 19,200 as well as review old records showing white blood cell count 19,800 in October of 2012. Chest x-ray was negative for infiltrate and urinalysis study negative with latest white blood cell count 15,500 on 03/20/2012 and monitored. Placed on scheduled Antivert 25 mg twice a day for dizziness. Neurology services consulted suspect possible right cerebellar infarcts not seen on initial cranial CT scan. Placed on Plavix therapy  Subjective/Complaints: Vomiting and nausea gradually improving. No complaints today. Slept well. Review of Systems  Neurological: Positive for dizziness.  All other systems reviewed and are negative.    Objective: Vital Signs: Blood pressure 169/59, pulse 68, temperature 97.7 F (36.5 C), temperature source Oral, resp. rate 18, weight 43.8 kg (96 lb 9 oz), SpO2 96.00%. No results found. Results for orders placed during the hospital encounter of 03/23/12 (from the past 72 hour(s))  GLUCOSE, CAPILLARY     Status: Abnormal   Collection Time   04/02/12 11:25 AM      Component Value Range Comment   Glucose-Capillary 125 (*) 70 - 99 mg/dL   GLUCOSE, CAPILLARY     Status: Abnormal   Collection Time   04/02/12  4:03 PM      Component Value Range Comment   Glucose-Capillary  125 (*) 70 - 99 mg/dL    Comment 1 Notify RN     GLUCOSE, CAPILLARY     Status: Abnormal   Collection Time   04/02/12  9:09 PM      Component Value Range Comment   Glucose-Capillary 119 (*) 70 - 99 mg/dL    Comment 1 Notify RN     GLUCOSE, CAPILLARY     Status: Abnormal   Collection Time   04/03/12  7:25 AM      Component Value Range Comment   Glucose-Capillary 115 (*) 70 - 99 mg/dL   GLUCOSE, CAPILLARY     Status: Abnormal   Collection Time   04/03/12 11:10 AM      Component Value Range Comment   Glucose-Capillary 152 (*) 70 - 99 mg/dL    Comment 1 Notify RN     GLUCOSE, CAPILLARY     Status: Normal   Collection Time   04/03/12  4:47 PM      Component Value Range Comment   Glucose-Capillary 93  70 - 99 mg/dL   GLUCOSE, CAPILLARY     Status: Abnormal   Collection Time   04/03/12  8:37 PM      Component Value Range Comment   Glucose-Capillary 112 (*) 70 - 99 mg/dL    Comment 1 Notify RN     GLUCOSE, CAPILLARY     Status: Abnormal   Collection Time   04/04/12  8:54 AM      Component Value Range Comment   Glucose-Capillary 152 (*) 70 - 99 mg/dL  GLUCOSE, CAPILLARY     Status: Abnormal   Collection Time   04/04/12 11:47 AM      Component Value Range Comment   Glucose-Capillary 108 (*) 70 - 99 mg/dL   GLUCOSE, CAPILLARY     Status: Abnormal   Collection Time   04/04/12  5:09 PM      Component Value Range Comment   Glucose-Capillary 146 (*) 70 - 99 mg/dL   GLUCOSE, CAPILLARY     Status: Abnormal   Collection Time   04/04/12  8:36 PM      Component Value Range Comment   Glucose-Capillary 103 (*) 70 - 99 mg/dL    Comment 1 Notify RN     GLUCOSE, CAPILLARY     Status: Abnormal   Collection Time   04/05/12  7:21 AM      Component Value Range Comment   Glucose-Capillary 125 (*) 70 - 99 mg/dL    Comment 1 Notify RN        HEENT: R lens opacification Cardio: RRR Resp: CTA B/L GI: BS positive Extremity:  No Edema Skin:   Intact Neuro: Alert/Oriented, Anxious, Cranial Nerve II-XII  normal, Normal Sensory, Normal Motor and Other fair static sitting balance. Mild right limb ataxia Musc/Skel:  Other Bilateral shoulder contracture   Assessment/Plan: 1. Functional deficits secondary to Thrombotic R cerebellar infarct which require 3+ hours per day of interdisciplinary therapy in a comprehensive inpatient rehab setting. Physiatrist is providing close team supervision and 24 hour management of active medical problems listed below. Physiatrist and rehab team continue to assess barriers to discharge/monitor patient progress toward functional and medical goals. FIM: FIM - Bathing Bathing Steps Patient Completed: Chest;Right Arm;Left Arm;Abdomen;Front perineal area;Buttocks;Right upper leg;Left upper leg;Right lower leg (including foot);Left lower leg (including foot) Bathing: 5: Supervision: Safety issues/verbal cues  FIM - Upper Body Dressing/Undressing Upper body dressing/undressing steps patient completed: Thread/unthread right bra strap;Thread/unthread left bra strap;Hook/unhook bra;Thread/unthread right sleeve of pullover shirt/dresss;Thread/unthread left sleeve of pullover shirt/dress;Put head through opening of pull over shirt/dress;Pull shirt over trunk Upper body dressing/undressing: 5: Supervision: Safety issues/verbal cues FIM - Lower Body Dressing/Undressing Lower body dressing/undressing steps patient completed: Thread/unthread right underwear leg;Thread/unthread left underwear leg;Pull underwear up/down;Thread/unthread right pants leg;Thread/unthread left pants leg;Pull pants up/down;Don/Doff right sock;Don/Doff left sock;Don/Doff right shoe;Don/Doff left shoe;Fasten/unfasten right shoe;Fasten/unfasten left shoe Lower body dressing/undressing: 5: Supervision: Safety issues/verbal cues  FIM - Toileting Toileting steps completed by patient: Adjust clothing prior to toileting;Performs perineal hygiene;Adjust clothing after toileting Toileting Assistive Devices: Grab bar  or rail for support Toileting: 5: Supervision: Safety issues/verbal cues  FIM - Diplomatic Services operational officer Devices: Best boy Transfers: 5-To toilet/BSC: Supervision (verbal cues/safety issues);5-From toilet/BSC: Supervision (verbal cues/safety issues)  FIM - Banker Devices: Walker;Arm rests Bed/Chair Transfer: 5: Bed > Chair or W/C: Supervision (verbal cues/safety issues);7: Supine > Sit: No assist  FIM - Locomotion: Wheelchair Distance: 30 Locomotion: Wheelchair: 1: Total Assistance/staff pushes wheelchair (Pt<25%) FIM - Locomotion: Ambulation Locomotion: Ambulation Assistive Devices: Designer, industrial/product Ambulation/Gait Assistance: 5: Supervision Locomotion: Ambulation: 2: Travels 50 - 149 ft with supervision/safety issues  Comprehension Comprehension Mode: Auditory Comprehension: 6-Follows complex conversation/direction: With extra time/assistive device  Expression Expression Mode: Verbal Expression: 6-Expresses complex ideas: With extra time/assistive device  Social Interaction Social Interaction: 6-Interacts appropriately with others with medication or extra time (anti-anxiety, antidepressant).  Problem Solving Problem Solving: 5-Solves complex 90% of the time/cues < 10% of the time  Memory Memory: 5-Recognizes or recalls 90%  of the time/requires cueing < 10% of the time  1. 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 DVT Prophylaxis/Anticoagulation: Subcutaneous heparin. Monitor platelet counts any signs of bleeding  3. Diabetes mellitus.Controlled Hemoglobin A1c 6.5. Glucophage 500 mg twice a day. Check blood sugars BID 4. Neuropsych: This patient is capable of making decisions on his/her own behalf.  5. Hypertension. Coreg 40 mg daily, lisinopril 40 mg daily. Monitor with increased activity. Secondary prevention education  6. Hyperlipidemia. Zocor  7.  Blindness right eye  8. Leukocytosis. White blood cell count 11.8 WBCs normalizing 9.  Constipation due to immobility will add senna 10.  Vomiting give metformin p breakfast.--improving   LOS (Days) 13 A FACE TO FACE EVALUATION WAS PERFORMED  KIRSTEINS,ANDREW E 04/05/2012, 8:56 AM

## 2012-04-05 NOTE — Progress Notes (Signed)
Physical Therapy Session Note  Patient Details  Name: Lynn Weeks MRN: [id number] Date of Birth: [birth year]  Today's Date: 04/05/2012 Time: 4098-1191 Time Calculation (min): 56 min  Second Treatment:  13:30-14:29 Time:  59 min  S:  Pt reporting that she does not feel right since vomiting episode this morning during B&D.  Pt repetitively reported that she felt dizzy during the session but always associated it to getting sick earlier. Second Treatment:  Pt reporting that she felt better, but would occasionally refer to feeling bad due to episode from the am. Short Term Goals: See LTGs Skilled Therapeutic Interventions/Progress Updates:   Granddaughter present for all sessions.  Focused on high level dynamic balance activities, including head turns and eye movements during both sessions.   Therapy Documentation Precautions:  Precautions Precautions: Fall Precaution Comments: vestibular Restrictions Weight Bearing Restrictions: No Vital Signs:  First session: BP:  160/78 Pain: Pain Assessment Pain Assessment: No/denies pain Pain Score:   3 (returning from therapy) Pain Type: Acute pain Pain Location: Back Pain Descriptors: Aching Patients Stated Pain Goal: 3 Pain Intervention(s): Medication (See eMAR) Mobility:  Sit to stand with supervision with RW, including transfer to toilet. Locomotion :   Steps with one rail on right x 5 with min@.  Gait on unit 150+' with RW with supervision. Second treatment:  Gait in community environment of gift shop negotiating obstacles with RW and supervision. Balance:  Dynamic standing balance to fold towels and assess dizziness issues while performing head turns and eye movements to perform.  Performed for 3 min without LOB or increase in dizziness symptoms.  Dynamic gait and balance to make up the bed, pt performed with supervision without difficulty. Second Treatment:  Dynamic standing balance x 9 min with table for support while playing  checkers.  Used [employer] for balance training, performing limits of stability and random control without UE support with close supervision. Other Treatments:   Had pt pick objects up from floor in sitting to see if increased dizziness, it did not.  Then performed tracking with head and eyes and symptoms increased with head  And eyes to the right horizontally. See FIM for current functional status  Therapy/Group: Individual Therapy  Georges Mouse 04/05/2012, 12:00 PM

## 2012-04-06 ENCOUNTER — Encounter (HOSPITAL_COMMUNITY): Payer: Medicare Other | Admitting: Occupational Therapy

## 2012-04-06 ENCOUNTER — Inpatient Hospital Stay (HOSPITAL_COMMUNITY): Payer: Medicare Other | Admitting: *Deleted

## 2012-04-06 DIAGNOSIS — I6330 Cerebral infarction due to thrombosis of unspecified cerebral artery: Secondary | ICD-10-CM

## 2012-04-06 DIAGNOSIS — I69993 Ataxia following unspecified cerebrovascular disease: Secondary | ICD-10-CM

## 2012-04-06 DIAGNOSIS — Z5189 Encounter for other specified aftercare: Secondary | ICD-10-CM

## 2012-04-06 LAB — CBC WITH DIFFERENTIAL/PLATELET
Basophils Absolute: 0 10*3/uL (ref 0.0–0.1)
Basophils Relative: 0 % (ref 0–1)
Eosinophils Relative: 1 % (ref 0–5)
HCT: 42.6 % (ref 36.0–46.0)
MCHC: 34 g/dL (ref 30.0–36.0)
Monocytes Absolute: 0.6 10*3/uL (ref 0.1–1.0)
Neutro Abs: 10.4 10*3/uL — ABNORMAL HIGH (ref 1.7–7.7)
Platelets: 75 10*3/uL — ABNORMAL LOW (ref 150–400)
RDW: 13.7 % (ref 11.5–15.5)
WBC: 12.4 10*3/uL — ABNORMAL HIGH (ref 4.0–10.5)

## 2012-04-06 LAB — GLUCOSE, CAPILLARY: Glucose-Capillary: 153 mg/dL — ABNORMAL HIGH (ref 70–99)

## 2012-04-06 MED ORDER — MECLIZINE HCL 25 MG PO TABS: 25.0000 mg | ORAL_TABLET | Freq: Two times a day (BID) | ORAL | Status: AC

## 2012-04-06 MED ORDER — CLOPIDOGREL BISULFATE 75 MG PO TABS: 75.0000 mg | ORAL_TABLET | Freq: Every day | ORAL | Status: AC

## 2012-04-06 NOTE — Progress Notes (Signed)
Recreational Therapy Discharge Summary Patient Details  Name: Lynn Weeks MRN: [id number] Date of Birth: Jun 23, 1919 Today's Date: 04/06/2012  Long term goals set: 1  Long term goals met: 1  Comments on progress toward goals: Pt has made good progress toward goal and is ready for discharge home with family to provide 24 hour care.  Pt continues to require verbal cues for safety during mobility and for compensatory strategies due to vestibular disturbance.  Pt's granddaughter has been present and participatory in therapy and demonstrates appropriate cuing/assist.  Reasons for discharge: discharge from hospital  Patient/family agrees with progress made and goals achieved: Yes  Lynn Weeks 04/06/2012, 5:18 PM

## 2012-04-06 NOTE — Progress Notes (Signed)
Recreational Therapy Session Note  Patient Details  Name: Lynn Weeks MRN: [id number] Date of Birth: 06/13/1919 Today's Date: 04/06/2012 Time: 1030-1230 Pain: no c/o Skilled Therapeutic Interventions/Progress Updates: Pt participated in community reintegration/outing to [employer] ambulatory level using RW with close supervision-min assist focusing on community mobility, identification and negotiation of obstacles, energy conservation techniques, accessing public restroom, and compensatory strategies for vestibular issues.  See outing goal sheet in shadow chart for full details.  Therapy/Group: [employer]  Raghad Lorenz 04/06/2012, 5:14 PM

## 2012-04-06 NOTE — Progress Notes (Signed)
Social Work Discharge Note Discharge Note  The overall goal for the admission was met for:   Discharge location: Yes-HOME WITH GRANDDAUGHTER PROVIDING CARE  Length of Stay: Yes-14 DAYS  Discharge activity level: Yes-SUPERVISION/MIN LEVEL  Home/community participation: Yes  Services provided included: MD, RD, PT, OT, RN, CM, TR, Pharmacy and SW  Financial Services: Medicare and Private Insurance: Event organiser SECONDARY  Follow-up services arranged: Home Health: [facility]-PT,OT,RN, DME: [facility] ROLLING WLAKER, WHEELCHAIR, BSC, TUB BENCH and Patient/Family request agency HH: [facility], DME: [facility]  Comments (or additional information):PT AND FAMILY REQUEST LEAVING TODAY INSTEAD OF TOMORROW, FINISHED FAMILY EDUCATION OUTING WENT WELL  Patient/Family verbalized understanding of follow-up arrangements: Yes  Individual responsible for coordination of the follow-up plan: CARMEN-GRANDDAUGHTER  Confirmed correct DME delivered: Lucy Chris 04/06/2012    Lucy Chris

## 2012-04-06 NOTE — Discharge Summary (Signed)
Lynn Weeks, Lynn Weeks            ACCOUNT NO.:  [id number]  MEDICAL RECORD NO.:  [id number]  LOCATION:  [id number]                         FACILITY:  [facility]  PHYSICIAN:  Lynn Weeks.DATE OF BIRTH:  27-May-1919  DATE OF ADMISSION:  03/23/2012 DATE OF DISCHARGE:  04/06/2012                              DISCHARGE SUMMARY   DISCHARGE DIAGNOSES: 1. Right cerebellar thrombotic infarction with right limb and truncal     ataxia. 2. Subcutaneous heparin for deep vein thrombosis prophylaxis. 3. Diabetes mellitus. 4. Hypertension. 5. Hyperlipidemia. 6. Blindness, right eye.  HISTORY OF PRESENT ILLNESS:  This is a 78 year old right-handed female with diabetes mellitus, peripheral neuropathy, coronary artery disease with pacemaker, who lives alone, was independent prior to admission, driving short distances.  Admitted on March 20, 2012, with near syncope and dizziness.  Cranial CT scan with no acute changes.  Echocardiogram was pending.  Cardiac panel negative.  Carotid Dopplers with 40-59% right ICA stenosis.  MRI of the brain not done secondary to pacemaker. Noted admission white count of 19,200.  Chest x-ray negative. Urinalysis study negative.  White count latest 15,500, monitor, remaining afebrile.  Placed on scheduled Antivert for dizziness. Neurology consulted, suspect possible right cerebellar infarct, not seen on initial cranial CT scan, and placed on Plavix therapy.  The patient had been on aspirin prior to admission.  Subcutaneous heparin added for DVT prophylaxis.  Followup cranial CT scan on March 23, 2012, confirmed acute infarct involving the right cerebellar hemisphere.  No hemorrhage seen.  The patient was admitted for comprehensive rehab program.  PAST MEDICAL HISTORY:  See discharge diagnoses.  SOCIAL HISTORY:  The patient lives alone.  She has assistance as needed at home.  One level home, 4 steps to entry.  FUNCTIONAL HISTORY:  Prior to admission was  independent, driving.  She is retired.  FUNCTIONAL STATUS:  Upon admission to rehab services was +2 total assist ambulate 20 feet with a rolling walker.  PHYSICAL EXAMINATION:  VITAL SIGNS:  Blood pressure 131/73, pulse 92, temperature 99, respirations 18. GENERAL:  This was an alert female, oriented x3.  Noted blindness to right eye. CARDIAC:  Rate controlled. ABDOMEN:  Soft, nontender.  Good bowel sounds. LUNGS:  Clear to auscultation. NEUROLOGIC:  She was hard of hearing.  Her speech was clear.  She followed full commands.  Cognitively, she was intact.  REHABILITATION HOSPITAL COURSE:  The patient was admitted to Inpatient Rehab Services with therapies initiated on a 3-hour daily basis, consisting of physical therapy, occupational therapy, and rehabilitation nursing.  The following issues were addressed during the patient's rehabilitation stay.  Pertaining to Ms. Maliszewski right cerebellar thrombotic infarction, remained stable, maintained on Plavix therapy. Subcutaneous heparin ongoing for DVT prophylaxis with no bleeding episodes.  She did have a history of diabetes mellitus with hemoglobin A1c of 6.5, maintained on Glucophage.  Blood pressures controlled on Coreg as well as lisinopril with no orthostatic hypotension.  She remained on Zocor for history of hyperlipidemia.  She did have some initial leukocytosis with full workup negative, latest white count of 11.8, remaining afebrile.  The patient received weekly collaborative interdisciplinary team conferences to discuss estimated length of stay, family teaching, and any barriers  to discharge.  She was continent of bowel and bladder, required minimal steady assist for bathing, dressing, supervision, grooming, handheld assist with ambulation and transfers. She was overall minimal assist at an ambulatory level.  Full family teaching was completed and plan will be discharged home with ongoing therapies dictated as per [employer].  DISCHARGE MEDICATIONS:  At the time of dictation included: 1. Coreg CR 40 mg daily. 2. Plavix 75 mg daily. 3. Lisinopril 40 mg daily. 4. Antivert 25 mg b.i.d. 5. Glucophage 500 mg b.i.d. 6. Ocuflox 0.3% ophthalmic solution 1 drop daily. 7. Senokot tablets 2 at bedtime. 8. Zocor 20 mg at bedtime.  DIET:  Diabetic diet.  SPECIAL INSTRUCTIONS:  Continue therapies as advised per [employer]. Follow up with Dr. Claudette Weeks at the [facility] on May 03, 2012, appointment made.  Follow up with Dr. Burnell Weeks, medical management, as directed medical care.     Lynn Weeks, P.A.   ______________________________ Lynn Weeks.    DA/MEDQ  D:  04/06/2012  T:  04/06/2012  Job:  [id number]  cc:   Dr. Noel Christmas Lynn Blanks, MD

## 2012-04-06 NOTE — Progress Notes (Signed)
Pt vomited times one, zofran given PO per MD order with relief

## 2012-04-06 NOTE — Progress Notes (Signed)
Social Work Patient ID: Lynn Weeks, female   DOB: 08-28-1918, 78 y.o.   MRN: [id number] Lisa-RT reports family and pt would like to go home today instead of tomorrow since already here and done with all family education. Talked with Jesusita Oka and Dr Wynn Banker who has okayed it.  Team informed.  All pleased with the plan of discharge today instead of tomorrow.

## 2012-04-06 NOTE — Progress Notes (Signed)
Occupational Therapy Session Note  Patient Details  Name: Lynn Weeks MRN: [id number] Date of Birth: Jul 12, 1919  Today's Date: 04/06/2012 Time: 0730-0815 Time Calculation (min): 45 min  Short Term Goals: Week 2:  OT Short Term Goal 1 (Week 2): STG = LTGs, supervision overall  Skilled Therapeutic Interventions/Progress Updates:    Pt completed ADL retraining at overall supervision level.  Pt requires close supervision with functional mobility with RW and cues for gait speed and to slow down with turns and remember to use gaze stabilization with head movements to decrease dizziness and nausea.  Pt with 1 episode of nausea with dry heaves and no vomiting post bathing and dressing.  Pt reported not feeling well and began dry heaving, RN notified.  Further education on gaze stabilization with self-care tasks and taking breaks throughout tasks to decrease dizziness and nausea.  Therapy Documentation Precautions:  Precautions Precautions: Fall Precaution Comments: vestibular issues Restrictions Weight Bearing Restrictions: No Pain: Pain Assessment Pain Assessment: No/denies pain Pain Score:   8 Pain Type: Acute pain Pain Location: Head Pain Descriptors: Headache Pain Intervention(s): Medication (See eMAR) ADL: ADL Eating: Set up Where Assessed-Eating: Chair Grooming: Supervision/safety Where Assessed-Grooming: Standing at sink Upper Body Bathing: Supervision/safety Where Assessed-Upper Body Bathing: Shower Lower Body Bathing: Supervision/safety Where Assessed-Lower Body Bathing: Shower Upper Body Dressing: Supervision/safety Where Assessed-Upper Body Dressing: Edge of bed Lower Body Dressing: Supervision/safety Where Assessed-Lower Body Dressing: Edge of bed Toileting: Supervision/safety Where Assessed-Toileting: Teacher, adult education: Close supervision Toilet Transfer Method: Ambulating (with RW) Acupuncturist: Grab bars Tub/Shower Transfer: Close  supervison Web designer Method: Ship broker: Insurance underwriter: Close supervision Film/video editor Method: Designer, industrial/product: Emergency planning/management officer ADL Comments: Pt with c/o dizziness and nausea during ADL sessions, pt with decreased ability to recall visual compensation strategies to decrease dizziness.  See FIM for current functional status  Therapy/Group: Individual Therapy  Leonette Monarch 04/06/2012, 10:01 AM

## 2012-04-06 NOTE — Progress Notes (Signed)
Patient ID: Lynn Weeks, female   DOB: [birth year], 78 y.o.   MRN: [id number] Lynn Weeks is a 78 y.o. right-handed female with history of type 2 diabetes mellitus, hypertension, coronary artery disease with pacemaker as well his history of inner ear problems. Patient lives alone and was independent prior to admission driving short distances. Admitted 03/20/2012 with near syncope and dizziness. Cranial CT scan with no acute changes. Echocardiogram pending. Cardiac panel was negative. Carotid Dopplers with 40-59% right ICA stenosis.Marland Kitchen MRI of the brain not done secondary to pacemaker. Noted admission white blood cell count of 19,200 as well as review old records showing white blood cell count 19,800 in October of 2012. Chest x-ray was negative for infiltrate and urinalysis study negative with latest white blood cell count 15,500 on 03/20/2012 and monitored. Placed on scheduled Antivert 25 mg twice a day for dizziness. Neurology services consulted suspect possible right cerebellar infarcts not seen on initial cranial CT scan. Placed on Plavix therapy  Occ vomiting no abd pain, no diarrhea Subjective/Complaints: Vomiting and nausea gradually improving. No complaints today. Slept well. Review of Systems  Neurological: Positive for dizziness.  All other systems reviewed and are negative.    Objective: Vital Signs: Blood pressure 157/68, pulse 74, temperature 97.5 F (36.4 C), temperature source Oral, resp. rate 19, weight 43.8 kg (96 lb 9 oz), SpO2 95.00%. No results found. Results for orders placed during the hospital encounter of 03/23/12 (from the past 72 hour(s))  GLUCOSE, CAPILLARY     Status: Abnormal   Collection Time   04/03/12 11:10 AM      Component Value Range Comment   Glucose-Capillary 152 (*) 70 - 99 mg/dL    Comment 1 Notify RN     GLUCOSE, CAPILLARY     Status: Normal   Collection Time   04/03/12  4:47 PM      Component Value Range Comment   Glucose-Capillary 93  70 - 99 mg/dL     GLUCOSE, CAPILLARY     Status: Abnormal   Collection Time   04/03/12  8:37 PM      Component Value Range Comment   Glucose-Capillary 112 (*) 70 - 99 mg/dL    Comment 1 Notify RN     GLUCOSE, CAPILLARY     Status: Abnormal   Collection Time   04/04/12  8:54 AM      Component Value Range Comment   Glucose-Capillary 152 (*) 70 - 99 mg/dL   GLUCOSE, CAPILLARY     Status: Abnormal   Collection Time   04/04/12 11:47 AM      Component Value Range Comment   Glucose-Capillary 108 (*) 70 - 99 mg/dL   GLUCOSE, CAPILLARY     Status: Abnormal   Collection Time   04/04/12  5:09 PM      Component Value Range Comment   Glucose-Capillary 146 (*) 70 - 99 mg/dL   GLUCOSE, CAPILLARY     Status: Abnormal   Collection Time   04/04/12  8:36 PM      Component Value Range Comment   Glucose-Capillary 103 (*) 70 - 99 mg/dL    Comment 1 Notify RN     GLUCOSE, CAPILLARY     Status: Abnormal   Collection Time   04/05/12  7:21 AM      Component Value Range Comment   Glucose-Capillary 125 (*) 70 - 99 mg/dL    Comment 1 Notify RN     GLUCOSE, CAPILLARY     Status:  Abnormal   Collection Time   04/05/12  4:10 PM      Component Value Range Comment   Glucose-Capillary 132 (*) 70 - 99 mg/dL   CBC WITH DIFFERENTIAL     Status: Abnormal   Collection Time   04/06/12  5:45 AM      Component Value Range Comment   WBC 12.4 (*) 4.0 - 10.5 K/uL    RBC 4.70  3.87 - 5.11 MIL/uL    Hemoglobin 14.5  12.0 - 15.0 g/dL    HCT 21.3  08.6 - 57.8 %    MCV 90.6  78.0 - 100.0 fL    MCH 30.9  26.0 - 34.0 pg    MCHC 34.0  30.0 - 36.0 g/dL    RDW 46.9  62.9 - 52.8 %    Platelets 75 (*) 150 - 400 K/uL    Neutrophils Relative 84 (*) 43 - 77 %    Neutro Abs 10.4 (*) 1.7 - 7.7 K/uL    Lymphocytes Relative 10 (*) 12 - 46 %    Lymphs Abs 1.3  0.7 - 4.0 K/uL    Monocytes Relative 5  3 - 12 %    Monocytes Absolute 0.6  0.1 - 1.0 K/uL    Eosinophils Relative 1  0 - 5 %    Eosinophils Absolute 0.1  0.0 - 0.7 K/uL    Basophils Relative 0  0  - 1 %    Basophils Absolute 0.0  0.0 - 0.1 K/uL   GLUCOSE, CAPILLARY     Status: Abnormal   Collection Time   04/06/12  7:22 AM      Component Value Range Comment   Glucose-Capillary 153 (*) 70 - 99 mg/dL    Comment 1 Notify RN        HEENT: R lens opacification Cardio: RRR Resp: CTA B/L GI: BS positive Extremity:  No Edema Skin:   Intact Neuro: Alert/Oriented, Anxious, Cranial Nerve II-XII normal, Normal Sensory, Normal Motor and Other fair static sitting balance. Mild right limb ataxia Musc/Skel:  Other Bilateral shoulder contracture   Assessment/Plan: 1. Functional deficits secondary to Thrombotic R cerebellar infarct should be stable for D/C in am.FIM: FIM - Bathing Bathing Steps Patient Completed: Chest;Right Arm;Left Arm;Abdomen;Front perineal area;Buttocks;Right upper leg;Left upper leg;Right lower leg (including foot);Left lower leg (including foot) Bathing: 5: Supervision: Safety issues/verbal cues  FIM - Upper Body Dressing/Undressing Upper body dressing/undressing steps patient completed: Thread/unthread right bra strap;Thread/unthread left bra strap;Hook/unhook bra;Thread/unthread right sleeve of pullover shirt/dresss;Thread/unthread left sleeve of pullover shirt/dress;Put head through opening of pull over shirt/dress;Pull shirt over trunk Upper body dressing/undressing: 5: Supervision: Safety issues/verbal cues FIM - Lower Body Dressing/Undressing Lower body dressing/undressing steps patient completed: Thread/unthread right underwear leg;Thread/unthread left underwear leg;Pull underwear up/down;Thread/unthread right pants leg;Thread/unthread left pants leg;Pull pants up/down;Don/Doff right sock;Don/Doff left sock;Don/Doff right shoe;Don/Doff left shoe;Fasten/unfasten right shoe;Fasten/unfasten left shoe Lower body dressing/undressing: 5: Supervision: Safety issues/verbal cues  FIM - Toileting Toileting steps completed by patient: Adjust clothing prior to  toileting;Performs perineal hygiene;Adjust clothing after toileting Toileting Assistive Devices: Grab bar or rail for support Toileting: 5: Supervision: Safety issues/verbal cues  FIM - Diplomatic Services operational officer Devices: Grab bars;Walker Toilet Transfers: 5-To toilet/BSC: Supervision (verbal cues/safety issues);5-From toilet/BSC: Supervision (verbal cues/safety issues)  FIM - Banker Devices: Walker;Arm rests Bed/Chair Transfer: 5: Supine > Sit: Supervision (verbal cues/safety issues);5: Bed > Chair or W/C: Supervision (verbal cues/safety issues)  FIM - Locomotion: Wheelchair Distance: 30 Locomotion:  Wheelchair: 1: Total Assistance/staff pushes wheelchair (Pt<25%) FIM - Locomotion: Ambulation Locomotion: Ambulation Assistive Devices: Walker - Rolling Ambulation/Gait Assistance: 5: Supervision Locomotion: Ambulation: 5: Travels 150 ft or more with supervision/safety issues  Comprehension Comprehension Mode: Auditory Comprehension: 6-Follows complex conversation/direction: With extra time/assistive device  Expression Expression Mode: Verbal Expression: 6-Expresses complex ideas: With extra time/assistive device  Social Interaction Social Interaction: 6-Interacts appropriately with others with medication or extra time (anti-anxiety, antidepressant).  Problem Solving Problem Solving: 5-Solves complex 90% of the time/cues < 10% of the time  Memory Memory: 5-Recognizes or recalls 90% of the time/requires cueing < 10% of the time  1. 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 DVT Prophylaxis/Anticoagulation: Subcutaneous heparin. Monitor platelet counts any signs of bleeding  3. Diabetes mellitus.Controlled Hemoglobin A1c 6.5. Glucophage 500 mg twice a day. Check blood sugars BID 4. Neuropsych: This patient is capable of making decisions on his/her own behalf.  5. Hypertension.  Coreg 40 mg daily, lisinopril 40 mg daily. Monitor with increased activity. Secondary prevention education  6. Hyperlipidemia. Zocor  7. Blindness right eye  8. Leukocytosis. White blood cell count stable with mild chronic elevation f/u with PCP 9.  Constipation due to immobility will add senna 10.  Vomiting give metformin p breakfast.--improving, likely is multifactorial, cerebellar CVA   LOS (Days) 14 A FACE TO FACE EVALUATION WAS PERFORMED  Josie Mesa E 04/06/2012, 8:27 AM

## 2012-04-06 NOTE — Progress Notes (Addendum)
Physical Therapy Discharge Summary  Patient Details  Name: Lynn Weeks MRN: [id number] Date of Birth: 14-Apr-1919  Today's Date: 04/06/2012 Time:  - [id number]      Patient has met 6 of 6 long term goals due to improved activity tolerance, improved balance, improved postural control, decreased pain and ability to compensate for deficits and vestibular issues.  Patient to discharge at an ambulatory level Supervision with RW. Patient verbalizes understanding of their increased fall risk and agrees to use assistive device for mobility as instructed until told otherwise by follow up PT.   Patient's care partner is independent to provide the necessary physical assistance at discharge. Pt still with vestibular issues that are difficult to pin down but she has intermittent nausea.  Leaning to R has significantly improved/resolved.    Reasons goals not met: n/a  Recommendation:  Patient will benefit from ongoing skilled PT services in home health setting to continue to advance safe functional mobility, address ongoing impairments in standing balance, vestibular issues, and minimize fall risk.  Pt was I without a device PTA.  Equipment: youth rw, w/c  Reasons for discharge: treatment goals met  Patient/family agrees with progress made and goals achieved: Yes  PT Discharge Precautions/Restrictions Precautions Precautions: Fall Precaution Comments: vestibular issues Vital Signs 85 with gait/steps Vision/Perception    blind R eye Cognition  decreased understanding of stroke but functional for tasks Sensation Sensation Light Touch: Appears Intact Proprioception: Appears Intact Motor  Motor Motor: Abnormal postural alignment and control Motor - Discharge Observations: decreased balance strategies in standing  Mobility Bed Mobility Rolling Right: 7: Independent Rolling Left: 7: Independent Supine to Sit: 7: Independent Sit to Supine: 7: Independent Transfers Sit to Stand: 6:  Modified independent (Device/Increase time);With armrests;With upper extremity assist Stand to Sit: 5: Supervision (decreased safety with fatigue) Stand Pivot Transfers: 5: Supervision Stand Pivot Transfer Details (indicate cue type and reason): S with fatigue. decreased contoll on descent but can be S with fatigue Locomotion  Ambulation Ambulation/Gait Assistance: 5: Supervision Ambulation Distance (Feet): 160 Feet Assistive device: Rolling walker Gait Gait: Yes Gait velocity: 1.82 ft/sec with RW  Trunk/Postural Assessment  Cervical Assessment Cervical Assessment: Within Functional Limits Thoracic Assessment Thoracic Assessment: Within Functional Limits Lumbar Assessment Lumbar Assessment: Within Functional Limits  Balance Balance Balance Assessed: Yes Static Sitting Balance Static Sitting - Level of Assistance: 7: Independent Dynamic Sitting Balance Dynamic Sitting - Level of Assistance: 7: Independent Static Standing Balance Static Standing - Balance Support: Bilateral upper extremity supported Static Standing - Level of Assistance: 6: Modified independent (Device/Increase time) Dynamic Standing Balance Dynamic Standing - Level of Assistance: 5: Stand by assistance Extremity Assessment      RLE Assessment RLE Assessment: Exceptions to [facility] same strength as LLE, coordination WFL, pt states she thinks she has a leg length discrepency   Skilled intervention - Reeval, therapeutic activity-curb training with RW x 2 S, balance training with perturbations- early stepping strategy with posterior LOB, Nu step level 4 15 min with MET 2 for activity tolerance/pt request this activity- feels it gives her energy. Denied pain initally but c/o upset stoach and that she had dry heaved after OT. Did report slight headache by end of PT session but declined intervention except rest.  Ready for her outing today, has sunglasses to decrease light sensitivity. See FIM for current functional  status  Michaelene Song 04/06/2012, 9:12 AM

## 2012-04-06 NOTE — Progress Notes (Signed)
Social Work Patient ID: Lynn Weeks, female   DOB: 08/24/1918, 78 y.o.   MRN: [id number] Met with pt and granddaughter to confirm discharge plans for tomorrow.  Both are going on the outing today. Discussed follow up needs, Home health and DME.  Both have no preference.  Pt glad to be going home tomorrow.

## 2012-04-06 NOTE — Discharge Summary (Signed)
  Discharge summary job [phone]

## 2012-04-06 NOTE — Progress Notes (Signed)
Pt discharged to home with family at 1330. Pt has all belongings with family. Discharge instructions given to pt and no further questions by pt or family.

## 2012-04-06 NOTE — Progress Notes (Signed)
Occupational Therapy Community Outing Group Note  Patient Details  Name: [name] MRN: [id number] Date of Birth: [birth year] Today's Date: 04/06/2012 1040-1220, 100 minutes co-treat with TR  Please refer to Community Outing Goal sheet located in shadow chart for details.  Lequita Meadowcroft 04/06/2012, 4:42 PM

## 2012-04-06 NOTE — Plan of Care (Signed)
Problem: RH SAFETY Goal: RH STG DEMO UNDERSTANDING HOME SAFETY PRECAUTIONS Outcome: Not Applicable Date Met:  04/06/12 Family involved in pts care and are the care giver at home.

## 2012-04-06 NOTE — Progress Notes (Signed)
Occupational Therapy Discharge Summary  Patient Details  Name: Lynn Weeks MRN: [id number] Date of Birth: [birth year]  Today's Date: 04/06/2012  Patient has met 9 of 9 long term goals due to improved activity tolerance, improved balance, postural control, ability to compensate for deficits and improved awareness.  Patient to discharge at overall Supervision level.  Patient's care partner is independent to provide the necessary physical and cognitive assistance at discharge.  Patient continues to exhibit signs of dizziness and visual instability, secondary to location of stroke, and continues to require cues for safety and adhering to gaze stabilization techniques to decrease frequency of dizziness and the resultant nausea.  Pt's granddaughter has demonstrated safety with transfers and appropriate cues to provide to increase pt's safety and independence with self-care tasks.  Reasons goals not met: N/A  Recommendation:  Patient will benefit from ongoing skilled OT services in home health setting to continue to advance functional skills in the area of BADL, iADL and Reduce care partner burden.  Equipment: tub bench and 3 in 1 BSC  Reasons for discharge: treatment goals met and discharge from hospital  Patient/family agrees with progress made and goals achieved: Yes  OT Discharge Precautions/Restrictions  Precautions Precautions: Fall Precaution Comments: vestibular issues Pain Pain Assessment Pain Assessment: No/denies pain ADL ADL Eating: Set up Where Assessed-Eating: Chair Grooming: Supervision/safety Where Assessed-Grooming: Standing at sink Upper Body Bathing: Supervision/safety Where Assessed-Upper Body Bathing: Shower Lower Body Bathing: Supervision/safety Where Assessed-Lower Body Bathing: Shower Upper Body Dressing: Supervision/safety Where Assessed-Upper Body Dressing: Edge of bed Lower Body Dressing: Supervision/safety Where Assessed-Lower Body Dressing: Edge of  bed Toileting: Supervision/safety Where Assessed-Toileting: Teacher, adult education: Close supervision Toilet Transfer Method: Ambulating (with RW) Acupuncturist: Grab bars Tub/Shower Transfer: Close supervison Web designer Method: Ship broker: Insurance underwriter: Close supervision Film/video editor Method: Designer, industrial/product: Emergency planning/management officer ADL Comments: Pt with c/o dizziness and nausea during ADL sessions, pt with decreased ability to recall visual compensation strategies to decrease dizziness. Vision/Perception  Vision - History Baseline Vision: Wears glasses all the time (blind in Rt eye) Visual History: Cataracts Patient Visual Report: Nausea/blurring vision with head movement  Cognition Overall Cognitive Status: Appears within functional limits for tasks assessed Arousal/Alertness: Awake/alert Orientation Level: Oriented X4 Sensation Sensation Light Touch: Appears Intact Hot/Cold: Appears Intact Proprioception: Appears Intact Coordination Gross Motor Movements are Fluid and Coordinated: No Fine Motor Movements are Fluid and Coordinated: Yes Coordination and Movement Description: occasional leans to Rt during dynamic tasks, much improved from eval Extremity/Trunk Assessment RUE Assessment RUE Assessment: Within Functional Limits LUE Assessment LUE Assessment: Exceptions to [facility] LUE AROM (degrees) LUE Overall AROM Comments: previous left RTC injury, shoulder flexion to 100, able to use arm in functional tasks  See FIM for current functional status  Leonette Monarch 04/06/2012, 3:23 PM

## 2012-04-10 DIAGNOSIS — E119 Type 2 diabetes mellitus without complications: Secondary | ICD-10-CM | POA: Diagnosis not present

## 2012-04-10 DIAGNOSIS — R279 Unspecified lack of coordination: Secondary | ICD-10-CM | POA: Diagnosis not present

## 2012-04-10 DIAGNOSIS — I10 Essential (primary) hypertension: Secondary | ICD-10-CM | POA: Diagnosis not present

## 2012-04-10 DIAGNOSIS — M6281 Muscle weakness (generalized): Secondary | ICD-10-CM | POA: Diagnosis not present

## 2012-04-10 DIAGNOSIS — I69998 Other sequelae following unspecified cerebrovascular disease: Secondary | ICD-10-CM | POA: Diagnosis not present

## 2012-04-12 DIAGNOSIS — I10 Essential (primary) hypertension: Secondary | ICD-10-CM | POA: Diagnosis not present

## 2012-04-12 DIAGNOSIS — R279 Unspecified lack of coordination: Secondary | ICD-10-CM | POA: Diagnosis not present

## 2012-04-12 DIAGNOSIS — M6281 Muscle weakness (generalized): Secondary | ICD-10-CM | POA: Diagnosis not present

## 2012-04-12 DIAGNOSIS — I69998 Other sequelae following unspecified cerebrovascular disease: Secondary | ICD-10-CM | POA: Diagnosis not present

## 2012-04-12 DIAGNOSIS — E119 Type 2 diabetes mellitus without complications: Secondary | ICD-10-CM | POA: Diagnosis not present

## 2012-04-14 DIAGNOSIS — I69998 Other sequelae following unspecified cerebrovascular disease: Secondary | ICD-10-CM | POA: Diagnosis not present

## 2012-04-14 DIAGNOSIS — R279 Unspecified lack of coordination: Secondary | ICD-10-CM | POA: Diagnosis not present

## 2012-04-14 DIAGNOSIS — E119 Type 2 diabetes mellitus without complications: Secondary | ICD-10-CM | POA: Diagnosis not present

## 2012-04-14 DIAGNOSIS — I10 Essential (primary) hypertension: Secondary | ICD-10-CM | POA: Diagnosis not present

## 2012-04-14 DIAGNOSIS — M6281 Muscle weakness (generalized): Secondary | ICD-10-CM | POA: Diagnosis not present

## 2012-04-19 DIAGNOSIS — I69998 Other sequelae following unspecified cerebrovascular disease: Secondary | ICD-10-CM | POA: Diagnosis not present

## 2012-04-19 DIAGNOSIS — Z8673 Personal history of transient ischemic attack (TIA), and cerebral infarction without residual deficits: Secondary | ICD-10-CM | POA: Diagnosis not present

## 2012-04-19 DIAGNOSIS — R279 Unspecified lack of coordination: Secondary | ICD-10-CM | POA: Diagnosis not present

## 2012-04-19 DIAGNOSIS — I10 Essential (primary) hypertension: Secondary | ICD-10-CM | POA: Diagnosis not present

## 2012-04-19 DIAGNOSIS — M6281 Muscle weakness (generalized): Secondary | ICD-10-CM | POA: Diagnosis not present

## 2012-04-19 DIAGNOSIS — Z23 Encounter for immunization: Secondary | ICD-10-CM | POA: Diagnosis not present

## 2012-04-19 DIAGNOSIS — E119 Type 2 diabetes mellitus without complications: Secondary | ICD-10-CM | POA: Diagnosis not present

## 2012-04-22 DIAGNOSIS — M6281 Muscle weakness (generalized): Secondary | ICD-10-CM | POA: Diagnosis not present

## 2012-04-22 DIAGNOSIS — I69998 Other sequelae following unspecified cerebrovascular disease: Secondary | ICD-10-CM | POA: Diagnosis not present

## 2012-04-22 DIAGNOSIS — R279 Unspecified lack of coordination: Secondary | ICD-10-CM | POA: Diagnosis not present

## 2012-04-22 DIAGNOSIS — I10 Essential (primary) hypertension: Secondary | ICD-10-CM | POA: Diagnosis not present

## 2012-04-22 DIAGNOSIS — E119 Type 2 diabetes mellitus without complications: Secondary | ICD-10-CM | POA: Diagnosis not present

## 2012-04-26 DIAGNOSIS — R279 Unspecified lack of coordination: Secondary | ICD-10-CM | POA: Diagnosis not present

## 2012-04-26 DIAGNOSIS — I10 Essential (primary) hypertension: Secondary | ICD-10-CM | POA: Diagnosis not present

## 2012-04-26 DIAGNOSIS — M6281 Muscle weakness (generalized): Secondary | ICD-10-CM | POA: Diagnosis not present

## 2012-04-26 DIAGNOSIS — I69998 Other sequelae following unspecified cerebrovascular disease: Secondary | ICD-10-CM | POA: Diagnosis not present

## 2012-04-26 DIAGNOSIS — E119 Type 2 diabetes mellitus without complications: Secondary | ICD-10-CM | POA: Diagnosis not present

## 2012-04-28 DIAGNOSIS — M6281 Muscle weakness (generalized): Secondary | ICD-10-CM | POA: Diagnosis not present

## 2012-04-28 DIAGNOSIS — R279 Unspecified lack of coordination: Secondary | ICD-10-CM | POA: Diagnosis not present

## 2012-04-28 DIAGNOSIS — I69998 Other sequelae following unspecified cerebrovascular disease: Secondary | ICD-10-CM | POA: Diagnosis not present

## 2012-04-28 DIAGNOSIS — E119 Type 2 diabetes mellitus without complications: Secondary | ICD-10-CM | POA: Diagnosis not present

## 2012-04-28 DIAGNOSIS — I10 Essential (primary) hypertension: Secondary | ICD-10-CM | POA: Diagnosis not present

## 2012-05-03 ENCOUNTER — Encounter: Payer: Self-pay | Admitting: Physical Medicine & Rehabilitation

## 2012-05-03 ENCOUNTER — Encounter: Payer: Medicare Other | Attending: Physical Medicine & Rehabilitation

## 2012-05-03 ENCOUNTER — Ambulatory Visit (HOSPITAL_BASED_OUTPATIENT_CLINIC_OR_DEPARTMENT_OTHER): Payer: Medicare Other | Admitting: Physical Medicine & Rehabilitation

## 2012-05-03 VITALS — BP 118/67 | HR 90 | Resp 16 | Ht <= 58 in | Wt 99.0 lb

## 2012-05-03 DIAGNOSIS — R42 Dizziness and giddiness: Secondary | ICD-10-CM | POA: Insufficient documentation

## 2012-05-03 DIAGNOSIS — Z950 Presence of cardiac pacemaker: Secondary | ICD-10-CM | POA: Insufficient documentation

## 2012-05-03 DIAGNOSIS — I69393 Ataxia following cerebral infarction: Secondary | ICD-10-CM

## 2012-05-03 DIAGNOSIS — I2510 Atherosclerotic heart disease of native coronary artery without angina pectoris: Secondary | ICD-10-CM | POA: Diagnosis not present

## 2012-05-03 DIAGNOSIS — I69993 Ataxia following unspecified cerebrovascular disease: Secondary | ICD-10-CM

## 2012-05-03 DIAGNOSIS — G609 Hereditary and idiopathic neuropathy, unspecified: Secondary | ICD-10-CM | POA: Insufficient documentation

## 2012-05-03 DIAGNOSIS — E119 Type 2 diabetes mellitus without complications: Secondary | ICD-10-CM | POA: Insufficient documentation

## 2012-05-03 NOTE — Patient Instructions (Signed)
Follow up with Dr Pearlean Brownie, Neurology, for secondary CVA prevention  Referral made to PT,OT at [facility]  See me in 1 month to follow up on therapy progress

## 2012-05-03 NOTE — Progress Notes (Signed)
Subjective:    Patient ID: Lynn Weeks, female    DOB: 15-Mar-1919, 78 y.o.   MRN: [id number]  HPI HISTORY OF PRESENT ILLNESS: This is a 77 year old right-handed female  with diabetes mellitus, peripheral neuropathy, coronary artery disease  with pacemaker, who lives alone, was independent prior to admission,  driving short distances. Admitted on March 20, 2012, with near syncope  and dizziness. Cranial CT scan with no acute changes. Echocardiogram  was pending. Cardiac panel negative. Carotid Dopplers with 40-59%  right ICA stenosis. MRI of the brain not done secondary to pacemaker.  Noted admission white count of 19,200. Chest x-ray negative.  Urinalysis study negative. White count latest 15,500, monitor,  remaining afebrile. Placed on scheduled Antivert for dizziness.  Neurology consulted, suspect possible right cerebellar infarct, not seen  on initial cranial CT scan, and placed on Plavix therapy. The patient  had been on aspirin prior to admission. Subcutaneous heparin added for  DVT prophylaxis. Followup cranial CT scan on March 23, 2012, confirmed  acute infarct involving the right cerebellar hemisphere  HHPT,HHOT twice a week Occ. dizziness Pain Inventory Average Pain 6 Pain Right Now 6 My pain is dull and aching  In the last 24 hours, has pain interfered with the following? General activity 0 Relation with others 0 Enjoyment of life 0 What TIME of day is your pain at its worst? morning Sleep (in general) Fair  Pain is worse with: unsure Pain improves with: rest Relief from Meds: 5  Mobility walk with assistance use a walker ability to climb steps?  yes do you drive?  no Do you have any goals in this area?  yes  Function retired  Neuro/Psych dizziness confusion  Prior Studies Any changes since last visit?  no  Physicians involved in your care Any changes since last visit?  no   Family History  Problem Relation Age of Onset  . Stroke Mother     History   Social History  . Marital Status: Widowed    Spouse Name: N/A    Number of Children: N/A  . Years of Education: N/A   Social History Main Topics  . Smoking status: Never Smoker   . Smokeless tobacco: Never Used  . Alcohol Use: No  . Drug Use: No  . Sexually Active: No   Other Topics Concern  . None   Social History Narrative   Lives alone and independently   Past Surgical History  Procedure Date  . Tonsillectomy   . Left total knee replacement   . Abdominal hysterectomy   . Cataract extraction, bilateral   . Right shoulder hemiarthroplasty 2005    Lynn Weeks  . Pacemaker insertion    Past Medical History  Diagnosis Date  . Complete heart block 05/08/2011  . CAD (coronary artery disease) 05/08/2011  . Hypertensive heart disease without congestive heart failure 05/08/2011  . Hyponatremia 05/08/2011  . Hyperlipemia 05/08/2011  . Type 2 diabetes 05/08/2011  . Murmur, cardiac 05/08/2011  . Osteoarthritis   . Anxiety   . Stroke    BP 118/67  Pulse 90  Resp 16  Ht 4\' 7"  (1.397 m)  Wt 99 lb (44.906 kg)  BMI 23.01 kg/m2  SpO2 94%     Review of Systems  Musculoskeletal: Positive for myalgias and arthralgias.  Neurological: Positive for dizziness.  Psychiatric/Behavioral: Positive for confusion.  All other systems reviewed and are negative.       Objective:   Physical Exam  Constitutional: She is oriented to  person, place, and time. She appears well-developed and well-nourished.  HENT:  Head: Normocephalic and atraumatic.  Eyes: EOM are normal. Pupils are equal, round, and reactive to light.       Opacified right lens  Neck: Normal range of motion.  Neurological: She is alert and oriented to person, place, and time.  Psychiatric: She has a normal mood and affect.  Positive Romberg Negative finger nose to finger testing        Assessment & Plan:  1. Cerebellar infarct On the right. She has residual truncal ataxia but no limb  ataxia She has mild intermittent vestibular symptoms. She would benefit from outpatient therapy at neuro rehabilitation. I have made a referral. Patient's daughter was asking whether the patient needed to be on Plavix long term. She'll need to get a neurology followup with Dr.Sethi I'll see the patient in one month

## 2012-05-04 DIAGNOSIS — R279 Unspecified lack of coordination: Secondary | ICD-10-CM | POA: Diagnosis not present

## 2012-05-04 DIAGNOSIS — M6281 Muscle weakness (generalized): Secondary | ICD-10-CM | POA: Diagnosis not present

## 2012-05-04 DIAGNOSIS — I10 Essential (primary) hypertension: Secondary | ICD-10-CM | POA: Diagnosis not present

## 2012-05-04 DIAGNOSIS — E119 Type 2 diabetes mellitus without complications: Secondary | ICD-10-CM | POA: Diagnosis not present

## 2012-05-04 DIAGNOSIS — I69998 Other sequelae following unspecified cerebrovascular disease: Secondary | ICD-10-CM | POA: Diagnosis not present

## 2012-05-06 DIAGNOSIS — R279 Unspecified lack of coordination: Secondary | ICD-10-CM | POA: Diagnosis not present

## 2012-05-06 DIAGNOSIS — M6281 Muscle weakness (generalized): Secondary | ICD-10-CM | POA: Diagnosis not present

## 2012-05-06 DIAGNOSIS — I69998 Other sequelae following unspecified cerebrovascular disease: Secondary | ICD-10-CM | POA: Diagnosis not present

## 2012-05-06 DIAGNOSIS — I10 Essential (primary) hypertension: Secondary | ICD-10-CM | POA: Diagnosis not present

## 2012-05-06 DIAGNOSIS — E119 Type 2 diabetes mellitus without complications: Secondary | ICD-10-CM | POA: Diagnosis not present

## 2012-05-12 ENCOUNTER — Ambulatory Visit: Payer: Medicare Other | Attending: Physical Medicine & Rehabilitation | Admitting: Physical Therapy

## 2012-05-12 DIAGNOSIS — Z950 Presence of cardiac pacemaker: Secondary | ICD-10-CM | POA: Diagnosis not present

## 2012-05-12 DIAGNOSIS — I252 Old myocardial infarction: Secondary | ICD-10-CM | POA: Diagnosis not present

## 2012-05-12 DIAGNOSIS — M6281 Muscle weakness (generalized): Secondary | ICD-10-CM | POA: Diagnosis not present

## 2012-05-12 DIAGNOSIS — I69998 Other sequelae following unspecified cerebrovascular disease: Secondary | ICD-10-CM | POA: Diagnosis not present

## 2012-05-12 DIAGNOSIS — E785 Hyperlipidemia, unspecified: Secondary | ICD-10-CM | POA: Diagnosis not present

## 2012-05-12 DIAGNOSIS — I10 Essential (primary) hypertension: Secondary | ICD-10-CM | POA: Diagnosis not present

## 2012-05-12 DIAGNOSIS — R269 Unspecified abnormalities of gait and mobility: Secondary | ICD-10-CM | POA: Insufficient documentation

## 2012-05-12 DIAGNOSIS — IMO0001 Reserved for inherently not codable concepts without codable children: Secondary | ICD-10-CM | POA: Insufficient documentation

## 2012-05-12 DIAGNOSIS — E119 Type 2 diabetes mellitus without complications: Secondary | ICD-10-CM | POA: Diagnosis not present

## 2012-05-12 DIAGNOSIS — I2510 Atherosclerotic heart disease of native coronary artery without angina pectoris: Secondary | ICD-10-CM | POA: Diagnosis not present

## 2012-05-12 DIAGNOSIS — I6350 Cerebral infarction due to unspecified occlusion or stenosis of unspecified cerebral artery: Secondary | ICD-10-CM | POA: Diagnosis not present

## 2012-05-12 DIAGNOSIS — K219 Gastro-esophageal reflux disease without esophagitis: Secondary | ICD-10-CM | POA: Diagnosis not present

## 2012-05-19 ENCOUNTER — Ambulatory Visit: Payer: Medicare Other | Admitting: Physical Therapy

## 2012-05-19 DIAGNOSIS — R269 Unspecified abnormalities of gait and mobility: Secondary | ICD-10-CM | POA: Diagnosis not present

## 2012-05-19 DIAGNOSIS — IMO0001 Reserved for inherently not codable concepts without codable children: Secondary | ICD-10-CM | POA: Diagnosis not present

## 2012-05-19 DIAGNOSIS — I69998 Other sequelae following unspecified cerebrovascular disease: Secondary | ICD-10-CM | POA: Diagnosis not present

## 2012-05-19 DIAGNOSIS — M6281 Muscle weakness (generalized): Secondary | ICD-10-CM | POA: Diagnosis not present

## 2012-05-21 ENCOUNTER — Ambulatory Visit: Payer: Medicare Other | Admitting: Physical Therapy

## 2012-05-21 DIAGNOSIS — M6281 Muscle weakness (generalized): Secondary | ICD-10-CM | POA: Diagnosis not present

## 2012-05-21 DIAGNOSIS — I69998 Other sequelae following unspecified cerebrovascular disease: Secondary | ICD-10-CM | POA: Diagnosis not present

## 2012-05-21 DIAGNOSIS — R269 Unspecified abnormalities of gait and mobility: Secondary | ICD-10-CM | POA: Diagnosis not present

## 2012-05-21 DIAGNOSIS — IMO0001 Reserved for inherently not codable concepts without codable children: Secondary | ICD-10-CM | POA: Diagnosis not present

## 2012-05-26 ENCOUNTER — Ambulatory Visit: Payer: Medicare Other | Admitting: Physical Therapy

## 2012-05-26 DIAGNOSIS — IMO0001 Reserved for inherently not codable concepts without codable children: Secondary | ICD-10-CM | POA: Diagnosis not present

## 2012-05-26 DIAGNOSIS — R269 Unspecified abnormalities of gait and mobility: Secondary | ICD-10-CM | POA: Diagnosis not present

## 2012-05-26 DIAGNOSIS — M6281 Muscle weakness (generalized): Secondary | ICD-10-CM | POA: Diagnosis not present

## 2012-05-26 DIAGNOSIS — I69998 Other sequelae following unspecified cerebrovascular disease: Secondary | ICD-10-CM | POA: Diagnosis not present

## 2012-05-28 ENCOUNTER — Ambulatory Visit: Payer: Medicare Other | Attending: Physical Medicine & Rehabilitation | Admitting: Physical Therapy

## 2012-05-28 DIAGNOSIS — IMO0001 Reserved for inherently not codable concepts without codable children: Secondary | ICD-10-CM | POA: Insufficient documentation

## 2012-05-28 DIAGNOSIS — I69998 Other sequelae following unspecified cerebrovascular disease: Secondary | ICD-10-CM | POA: Diagnosis not present

## 2012-05-28 DIAGNOSIS — R269 Unspecified abnormalities of gait and mobility: Secondary | ICD-10-CM | POA: Insufficient documentation

## 2012-05-28 DIAGNOSIS — M6281 Muscle weakness (generalized): Secondary | ICD-10-CM | POA: Insufficient documentation

## 2012-06-02 ENCOUNTER — Ambulatory Visit: Payer: Medicare Other | Admitting: Physical Therapy

## 2012-06-04 ENCOUNTER — Ambulatory Visit: Payer: Medicare Other | Admitting: Physical Medicine & Rehabilitation

## 2012-06-04 ENCOUNTER — Ambulatory Visit: Payer: Medicare Other | Admitting: Physical Therapy

## 2012-06-09 ENCOUNTER — Ambulatory Visit: Payer: Medicare Other | Admitting: Physical Therapy

## 2012-06-11 ENCOUNTER — Ambulatory Visit: Payer: Medicare Other | Admitting: Physical Therapy

## 2012-07-20 DIAGNOSIS — E119 Type 2 diabetes mellitus without complications: Secondary | ICD-10-CM | POA: Diagnosis not present

## 2012-07-20 DIAGNOSIS — E782 Mixed hyperlipidemia: Secondary | ICD-10-CM | POA: Diagnosis not present

## 2012-07-20 DIAGNOSIS — I10 Essential (primary) hypertension: Secondary | ICD-10-CM | POA: Diagnosis not present

## 2012-08-18 DIAGNOSIS — H1045 Other chronic allergic conjunctivitis: Secondary | ICD-10-CM | POA: Diagnosis not present

## 2012-09-04 DIAGNOSIS — Z950 Presence of cardiac pacemaker: Secondary | ICD-10-CM | POA: Diagnosis not present

## 2012-09-04 DIAGNOSIS — I6350 Cerebral infarction due to unspecified occlusion or stenosis of unspecified cerebral artery: Secondary | ICD-10-CM | POA: Diagnosis not present

## 2012-09-07 IMAGING — CR DG CHEST 1V PORT
1 series · 1 of 1 positions shown · non-contrast
Comparison: 05/10/2011

CLINICAL DATA: Dizziness, weakness, possible pneumonia

PORTABLE CHEST - 1 VIEW

[AP]
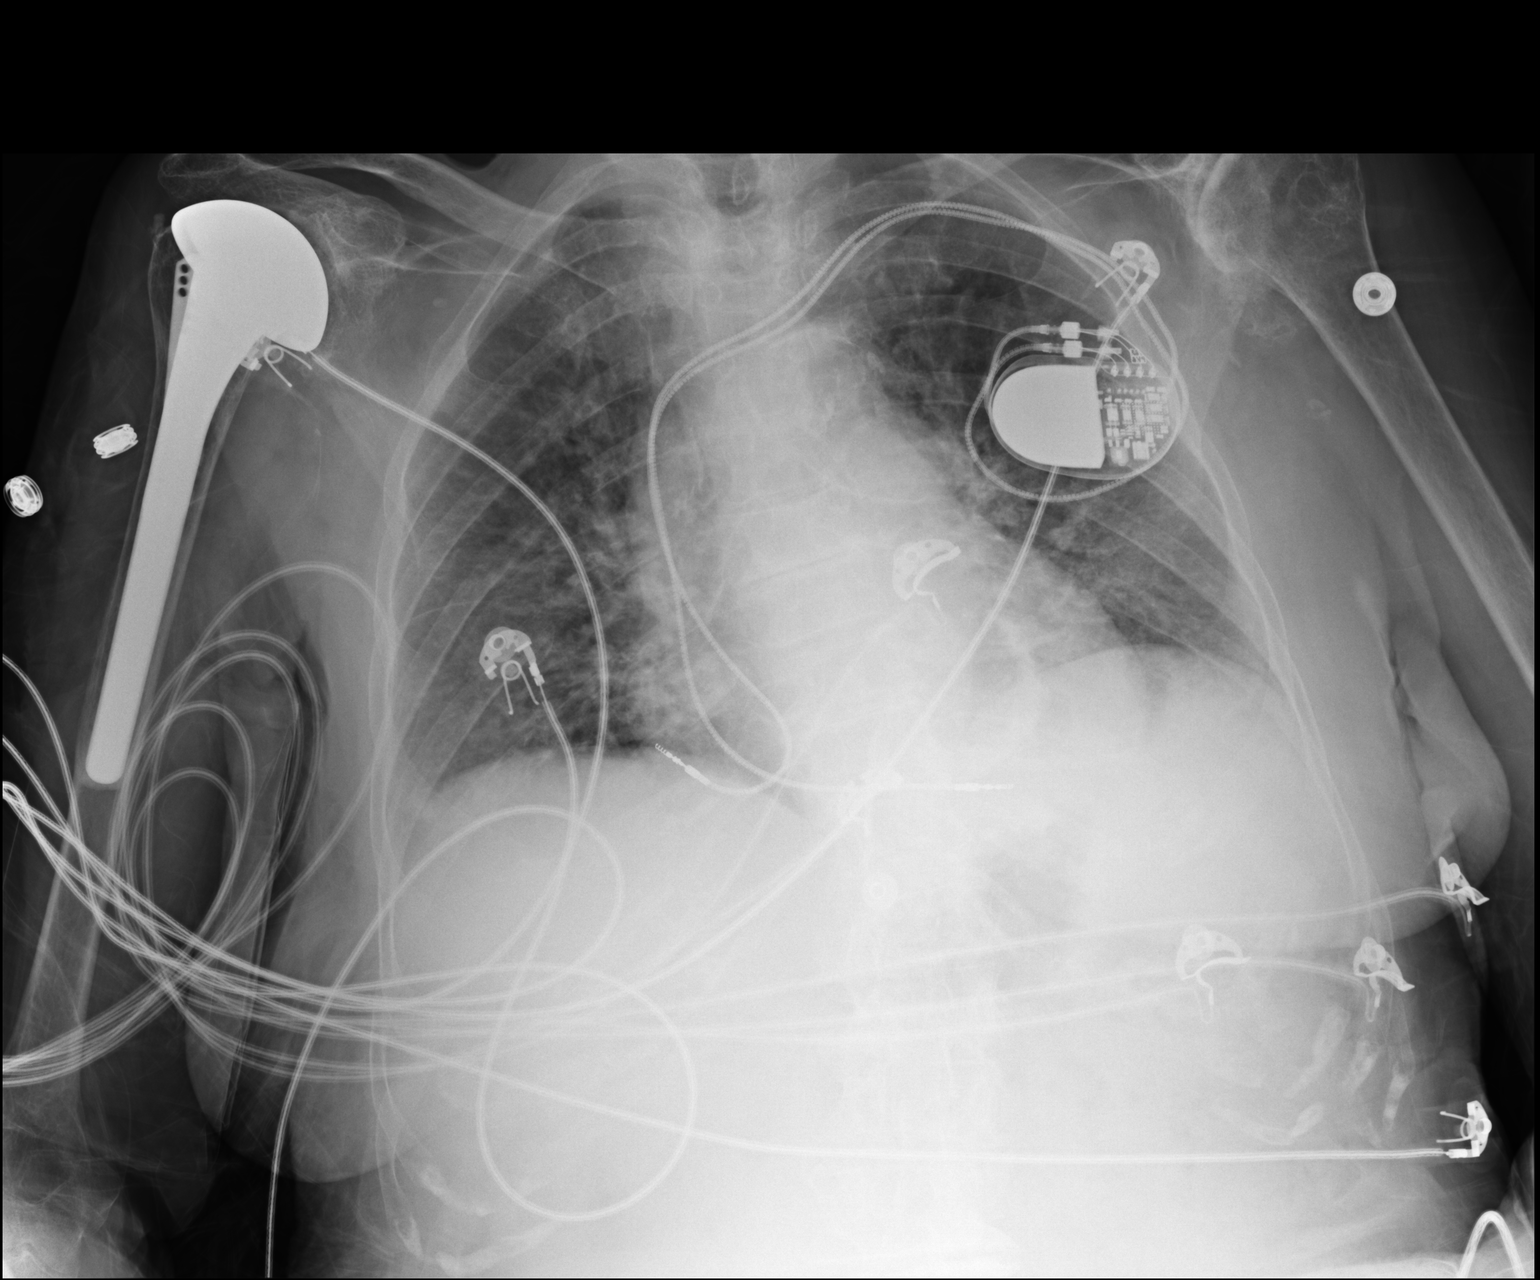

[1 of 1 positions shown; findings below may reference images not displayed]

FINDINGS: Low lung volumes with vascular crowding.  Increased
interstitial markings, likely chronic.  Mild patchy bilateral lower
lobe opacities, favored to reflect atelectasis. Medial right lower
lobe pneumonia is not entirely excluded.  No pleural effusion or
pneumothorax.

Mild cardiomegaly.  Left subclavian pacemaker.

Right shoulder arthroplasty.
IMPRESSION: Mild patchy bilateral lower lobe opacities, favored to reflect
atelectasis.  Medial right lower lobe pneumonia is not entirely
excluded but is considered less likely.

Mild cardiomegaly.

These results were called by telephone on 03/20/2012 at 2444 hrs to
Dr Jhonjaner Rodrigue, who verbally acknowledged these results.

## 2012-12-06 DIAGNOSIS — I10 Essential (primary) hypertension: Secondary | ICD-10-CM | POA: Diagnosis not present

## 2012-12-06 DIAGNOSIS — I2510 Atherosclerotic heart disease of native coronary artery without angina pectoris: Secondary | ICD-10-CM | POA: Diagnosis not present

## 2012-12-06 DIAGNOSIS — I6350 Cerebral infarction due to unspecified occlusion or stenosis of unspecified cerebral artery: Secondary | ICD-10-CM | POA: Diagnosis not present

## 2012-12-06 DIAGNOSIS — K219 Gastro-esophageal reflux disease without esophagitis: Secondary | ICD-10-CM | POA: Diagnosis not present

## 2012-12-06 DIAGNOSIS — E785 Hyperlipidemia, unspecified: Secondary | ICD-10-CM | POA: Diagnosis not present

## 2012-12-06 DIAGNOSIS — I252 Old myocardial infarction: Secondary | ICD-10-CM | POA: Diagnosis not present

## 2012-12-06 DIAGNOSIS — Z950 Presence of cardiac pacemaker: Secondary | ICD-10-CM | POA: Diagnosis not present

## 2012-12-06 DIAGNOSIS — E119 Type 2 diabetes mellitus without complications: Secondary | ICD-10-CM | POA: Diagnosis not present

## 2012-12-17 DIAGNOSIS — E119 Type 2 diabetes mellitus without complications: Secondary | ICD-10-CM | POA: Diagnosis not present

## 2012-12-17 DIAGNOSIS — Z9181 History of falling: Secondary | ICD-10-CM | POA: Diagnosis not present

## 2012-12-17 DIAGNOSIS — IMO0002 Reserved for concepts with insufficient information to code with codable children: Secondary | ICD-10-CM | POA: Diagnosis not present

## 2012-12-17 DIAGNOSIS — E782 Mixed hyperlipidemia: Secondary | ICD-10-CM | POA: Diagnosis not present

## 2012-12-17 DIAGNOSIS — Z1331 Encounter for screening for depression: Secondary | ICD-10-CM | POA: Diagnosis not present

## 2013-03-10 DIAGNOSIS — E785 Hyperlipidemia, unspecified: Secondary | ICD-10-CM | POA: Diagnosis not present

## 2013-03-10 DIAGNOSIS — I6350 Cerebral infarction due to unspecified occlusion or stenosis of unspecified cerebral artery: Secondary | ICD-10-CM | POA: Diagnosis not present

## 2013-03-10 DIAGNOSIS — E119 Type 2 diabetes mellitus without complications: Secondary | ICD-10-CM | POA: Diagnosis not present

## 2013-03-10 DIAGNOSIS — K219 Gastro-esophageal reflux disease without esophagitis: Secondary | ICD-10-CM | POA: Diagnosis not present

## 2013-03-10 DIAGNOSIS — Z950 Presence of cardiac pacemaker: Secondary | ICD-10-CM | POA: Diagnosis not present

## 2013-03-10 DIAGNOSIS — I252 Old myocardial infarction: Secondary | ICD-10-CM | POA: Diagnosis not present

## 2013-03-10 DIAGNOSIS — I2510 Atherosclerotic heart disease of native coronary artery without angina pectoris: Secondary | ICD-10-CM | POA: Diagnosis not present

## 2013-03-10 DIAGNOSIS — I10 Essential (primary) hypertension: Secondary | ICD-10-CM | POA: Diagnosis not present

## 2013-05-11 DIAGNOSIS — Z23 Encounter for immunization: Secondary | ICD-10-CM | POA: Diagnosis not present

## 2013-05-27 DIAGNOSIS — H2510 Age-related nuclear cataract, unspecified eye: Secondary | ICD-10-CM | POA: Diagnosis not present

## 2013-06-27 DIAGNOSIS — I252 Old myocardial infarction: Secondary | ICD-10-CM | POA: Diagnosis not present

## 2013-06-27 DIAGNOSIS — I6350 Cerebral infarction due to unspecified occlusion or stenosis of unspecified cerebral artery: Secondary | ICD-10-CM | POA: Diagnosis not present

## 2013-06-27 DIAGNOSIS — E785 Hyperlipidemia, unspecified: Secondary | ICD-10-CM | POA: Diagnosis not present

## 2013-06-27 DIAGNOSIS — K219 Gastro-esophageal reflux disease without esophagitis: Secondary | ICD-10-CM | POA: Diagnosis not present

## 2013-06-27 DIAGNOSIS — I10 Essential (primary) hypertension: Secondary | ICD-10-CM | POA: Diagnosis not present

## 2013-06-27 DIAGNOSIS — I2510 Atherosclerotic heart disease of native coronary artery without angina pectoris: Secondary | ICD-10-CM | POA: Diagnosis not present

## 2013-06-27 DIAGNOSIS — Z950 Presence of cardiac pacemaker: Secondary | ICD-10-CM | POA: Diagnosis not present

## 2013-06-27 DIAGNOSIS — E119 Type 2 diabetes mellitus without complications: Secondary | ICD-10-CM | POA: Diagnosis not present

## 2013-07-07 DIAGNOSIS — E785 Hyperlipidemia, unspecified: Secondary | ICD-10-CM | POA: Diagnosis not present

## 2013-07-07 DIAGNOSIS — E119 Type 2 diabetes mellitus without complications: Secondary | ICD-10-CM | POA: Diagnosis not present

## 2013-07-07 DIAGNOSIS — I2510 Atherosclerotic heart disease of native coronary artery without angina pectoris: Secondary | ICD-10-CM | POA: Diagnosis not present

## 2013-07-07 DIAGNOSIS — I252 Old myocardial infarction: Secondary | ICD-10-CM | POA: Diagnosis not present

## 2013-07-07 DIAGNOSIS — Z950 Presence of cardiac pacemaker: Secondary | ICD-10-CM | POA: Diagnosis not present

## 2013-07-07 DIAGNOSIS — I10 Essential (primary) hypertension: Secondary | ICD-10-CM | POA: Diagnosis not present

## 2013-07-07 DIAGNOSIS — I6350 Cerebral infarction due to unspecified occlusion or stenosis of unspecified cerebral artery: Secondary | ICD-10-CM | POA: Diagnosis not present

## 2013-07-07 DIAGNOSIS — K219 Gastro-esophageal reflux disease without esophagitis: Secondary | ICD-10-CM | POA: Diagnosis not present

## 2013-08-12 DIAGNOSIS — IMO0002 Reserved for concepts with insufficient information to code with codable children: Secondary | ICD-10-CM | POA: Diagnosis not present

## 2013-08-12 DIAGNOSIS — N183 Chronic kidney disease, stage 3 unspecified: Secondary | ICD-10-CM | POA: Diagnosis not present

## 2013-08-12 DIAGNOSIS — E119 Type 2 diabetes mellitus without complications: Secondary | ICD-10-CM | POA: Diagnosis not present

## 2013-08-12 DIAGNOSIS — Z9181 History of falling: Secondary | ICD-10-CM | POA: Diagnosis not present

## 2013-08-12 DIAGNOSIS — E782 Mixed hyperlipidemia: Secondary | ICD-10-CM | POA: Diagnosis not present

## 2013-08-12 DIAGNOSIS — Z1331 Encounter for screening for depression: Secondary | ICD-10-CM | POA: Diagnosis not present

## 2013-08-12 DIAGNOSIS — I10 Essential (primary) hypertension: Secondary | ICD-10-CM | POA: Diagnosis not present

## 2013-08-12 DIAGNOSIS — IMO0001 Reserved for inherently not codable concepts without codable children: Secondary | ICD-10-CM | POA: Diagnosis not present

## 2013-10-12 DIAGNOSIS — E785 Hyperlipidemia, unspecified: Secondary | ICD-10-CM | POA: Diagnosis not present

## 2013-10-12 DIAGNOSIS — K219 Gastro-esophageal reflux disease without esophagitis: Secondary | ICD-10-CM | POA: Diagnosis not present

## 2013-10-12 DIAGNOSIS — I10 Essential (primary) hypertension: Secondary | ICD-10-CM | POA: Diagnosis not present

## 2013-10-12 DIAGNOSIS — E119 Type 2 diabetes mellitus without complications: Secondary | ICD-10-CM | POA: Diagnosis not present

## 2013-10-12 DIAGNOSIS — Z950 Presence of cardiac pacemaker: Secondary | ICD-10-CM | POA: Diagnosis not present

## 2013-10-12 DIAGNOSIS — I6350 Cerebral infarction due to unspecified occlusion or stenosis of unspecified cerebral artery: Secondary | ICD-10-CM | POA: Diagnosis not present

## 2013-10-12 DIAGNOSIS — I2510 Atherosclerotic heart disease of native coronary artery without angina pectoris: Secondary | ICD-10-CM | POA: Diagnosis not present

## 2013-10-12 DIAGNOSIS — I252 Old myocardial infarction: Secondary | ICD-10-CM | POA: Diagnosis not present

## 2014-02-10 DIAGNOSIS — I10 Essential (primary) hypertension: Secondary | ICD-10-CM | POA: Diagnosis not present

## 2014-02-10 DIAGNOSIS — Z23 Encounter for immunization: Secondary | ICD-10-CM | POA: Diagnosis not present

## 2014-02-10 DIAGNOSIS — IMO0001 Reserved for inherently not codable concepts without codable children: Secondary | ICD-10-CM | POA: Diagnosis not present

## 2014-02-10 DIAGNOSIS — N183 Chronic kidney disease, stage 3 unspecified: Secondary | ICD-10-CM | POA: Diagnosis not present

## 2014-02-10 DIAGNOSIS — E7800 Pure hypercholesterolemia, unspecified: Secondary | ICD-10-CM | POA: Diagnosis not present

## 2014-02-10 DIAGNOSIS — E119 Type 2 diabetes mellitus without complications: Secondary | ICD-10-CM | POA: Diagnosis not present

## 2014-02-28 DIAGNOSIS — J019 Acute sinusitis, unspecified: Secondary | ICD-10-CM | POA: Diagnosis not present

## 2014-02-28 DIAGNOSIS — H109 Unspecified conjunctivitis: Secondary | ICD-10-CM | POA: Diagnosis not present

## 2014-07-04 DIAGNOSIS — Z950 Presence of cardiac pacemaker: Secondary | ICD-10-CM | POA: Diagnosis not present

## 2014-07-04 DIAGNOSIS — I10 Essential (primary) hypertension: Secondary | ICD-10-CM | POA: Diagnosis not present

## 2014-07-04 DIAGNOSIS — K219 Gastro-esophageal reflux disease without esophagitis: Secondary | ICD-10-CM | POA: Diagnosis not present

## 2014-07-04 DIAGNOSIS — I252 Old myocardial infarction: Secondary | ICD-10-CM | POA: Diagnosis not present

## 2014-07-04 DIAGNOSIS — I2510 Atherosclerotic heart disease of native coronary artery without angina pectoris: Secondary | ICD-10-CM | POA: Diagnosis not present

## 2014-07-04 DIAGNOSIS — E119 Type 2 diabetes mellitus without complications: Secondary | ICD-10-CM | POA: Diagnosis not present

## 2014-07-04 DIAGNOSIS — E785 Hyperlipidemia, unspecified: Secondary | ICD-10-CM | POA: Diagnosis not present

## 2014-07-04 DIAGNOSIS — I6359 Cerebral infarction due to unspecified occlusion or stenosis of other cerebral artery: Secondary | ICD-10-CM | POA: Diagnosis not present

## 2014-07-07 DIAGNOSIS — Z950 Presence of cardiac pacemaker: Secondary | ICD-10-CM | POA: Diagnosis not present

## 2014-07-07 DIAGNOSIS — E119 Type 2 diabetes mellitus without complications: Secondary | ICD-10-CM | POA: Diagnosis not present

## 2014-07-07 DIAGNOSIS — I2510 Atherosclerotic heart disease of native coronary artery without angina pectoris: Secondary | ICD-10-CM | POA: Diagnosis not present

## 2014-07-07 DIAGNOSIS — I252 Old myocardial infarction: Secondary | ICD-10-CM | POA: Diagnosis not present

## 2014-07-07 DIAGNOSIS — I6359 Cerebral infarction due to unspecified occlusion or stenosis of other cerebral artery: Secondary | ICD-10-CM | POA: Diagnosis not present

## 2014-07-07 DIAGNOSIS — I10 Essential (primary) hypertension: Secondary | ICD-10-CM | POA: Diagnosis not present

## 2014-07-07 DIAGNOSIS — E785 Hyperlipidemia, unspecified: Secondary | ICD-10-CM | POA: Diagnosis not present

## 2014-07-07 DIAGNOSIS — K219 Gastro-esophageal reflux disease without esophagitis: Secondary | ICD-10-CM | POA: Diagnosis not present

## 2014-08-15 DIAGNOSIS — N183 Chronic kidney disease, stage 3 (moderate): Secondary | ICD-10-CM | POA: Diagnosis not present

## 2014-08-15 DIAGNOSIS — Z23 Encounter for immunization: Secondary | ICD-10-CM | POA: Diagnosis not present

## 2014-08-15 DIAGNOSIS — E119 Type 2 diabetes mellitus without complications: Secondary | ICD-10-CM | POA: Diagnosis not present

## 2014-09-15 DIAGNOSIS — H10023 Other mucopurulent conjunctivitis, bilateral: Secondary | ICD-10-CM | POA: Diagnosis not present

## 2014-09-27 DIAGNOSIS — H10023 Other mucopurulent conjunctivitis, bilateral: Secondary | ICD-10-CM | POA: Diagnosis not present

## 2014-12-23 DIAGNOSIS — Z950 Presence of cardiac pacemaker: Secondary | ICD-10-CM | POA: Diagnosis not present

## 2014-12-23 DIAGNOSIS — E785 Hyperlipidemia, unspecified: Secondary | ICD-10-CM | POA: Diagnosis not present

## 2014-12-23 DIAGNOSIS — I6359 Cerebral infarction due to unspecified occlusion or stenosis of other cerebral artery: Secondary | ICD-10-CM | POA: Diagnosis not present

## 2014-12-23 DIAGNOSIS — I2510 Atherosclerotic heart disease of native coronary artery without angina pectoris: Secondary | ICD-10-CM | POA: Diagnosis not present

## 2014-12-23 DIAGNOSIS — I252 Old myocardial infarction: Secondary | ICD-10-CM | POA: Diagnosis not present

## 2014-12-23 DIAGNOSIS — K219 Gastro-esophageal reflux disease without esophagitis: Secondary | ICD-10-CM | POA: Diagnosis not present

## 2014-12-23 DIAGNOSIS — E119 Type 2 diabetes mellitus without complications: Secondary | ICD-10-CM | POA: Diagnosis not present

## 2014-12-23 DIAGNOSIS — I10 Essential (primary) hypertension: Secondary | ICD-10-CM | POA: Diagnosis not present

## 2015-02-20 DIAGNOSIS — I10 Essential (primary) hypertension: Secondary | ICD-10-CM | POA: Diagnosis not present

## 2015-02-20 DIAGNOSIS — E119 Type 2 diabetes mellitus without complications: Secondary | ICD-10-CM | POA: Diagnosis not present

## 2015-02-20 DIAGNOSIS — J309 Allergic rhinitis, unspecified: Secondary | ICD-10-CM | POA: Diagnosis not present

## 2015-02-20 DIAGNOSIS — D72829 Elevated white blood cell count, unspecified: Secondary | ICD-10-CM | POA: Diagnosis not present

## 2015-02-20 DIAGNOSIS — N183 Chronic kidney disease, stage 3 (moderate): Secondary | ICD-10-CM | POA: Diagnosis not present

## 2015-02-20 DIAGNOSIS — Z6824 Body mass index (BMI) 24.0-24.9, adult: Secondary | ICD-10-CM | POA: Diagnosis not present

## 2015-02-20 DIAGNOSIS — Z1389 Encounter for screening for other disorder: Secondary | ICD-10-CM | POA: Diagnosis not present

## 2015-02-20 DIAGNOSIS — Z9181 History of falling: Secondary | ICD-10-CM | POA: Diagnosis not present

## 2015-04-13 ENCOUNTER — Emergency Department (HOSPITAL_COMMUNITY): Payer: Medicare Other

## 2015-04-13 ENCOUNTER — Inpatient Hospital Stay (HOSPITAL_COMMUNITY)
Admission: EM | Admit: 2015-04-13 | Discharge: 2015-04-18 | DRG: 682 | Disposition: A | Discharge status: Skilled Nursing Facility | Payer: Medicare Other | Attending: Internal Medicine | Admitting: Internal Medicine

## 2015-04-13 DIAGNOSIS — N183 Chronic kidney disease, stage 3 unspecified: Secondary | ICD-10-CM

## 2015-04-13 DIAGNOSIS — Z66 Do not resuscitate: Secondary | ICD-10-CM | POA: Diagnosis present

## 2015-04-13 DIAGNOSIS — Z90710 Acquired absence of both cervix and uterus: Secondary | ICD-10-CM

## 2015-04-13 DIAGNOSIS — M6281 Muscle weakness (generalized): Secondary | ICD-10-CM | POA: Diagnosis not present

## 2015-04-13 DIAGNOSIS — A419 Sepsis, unspecified organism: Secondary | ICD-10-CM

## 2015-04-13 DIAGNOSIS — Z881 Allergy status to other antibiotic agents status: Secondary | ICD-10-CM

## 2015-04-13 DIAGNOSIS — Z96652 Presence of left artificial knee joint: Secondary | ICD-10-CM | POA: Diagnosis present

## 2015-04-13 DIAGNOSIS — R0902 Hypoxemia: Secondary | ICD-10-CM | POA: Diagnosis not present

## 2015-04-13 DIAGNOSIS — N179 Acute kidney failure, unspecified: Principal | ICD-10-CM | POA: Diagnosis present

## 2015-04-13 DIAGNOSIS — I10 Essential (primary) hypertension: Secondary | ICD-10-CM | POA: Diagnosis not present

## 2015-04-13 DIAGNOSIS — E869 Volume depletion, unspecified: Secondary | ICD-10-CM | POA: Diagnosis present

## 2015-04-13 DIAGNOSIS — G9340 Encephalopathy, unspecified: Secondary | ICD-10-CM | POA: Diagnosis not present

## 2015-04-13 DIAGNOSIS — Z8673 Personal history of transient ischemic attack (TIA), and cerebral infarction without residual deficits: Secondary | ICD-10-CM

## 2015-04-13 DIAGNOSIS — T68XXXA Hypothermia, initial encounter: Secondary | ICD-10-CM | POA: Diagnosis not present

## 2015-04-13 DIAGNOSIS — Z9841 Cataract extraction status, right eye: Secondary | ICD-10-CM | POA: Diagnosis not present

## 2015-04-13 DIAGNOSIS — I5031 Acute diastolic (congestive) heart failure: Secondary | ICD-10-CM | POA: Diagnosis not present

## 2015-04-13 DIAGNOSIS — J810 Acute pulmonary edema: Secondary | ICD-10-CM | POA: Diagnosis present

## 2015-04-13 DIAGNOSIS — F0390 Unspecified dementia without behavioral disturbance: Secondary | ICD-10-CM | POA: Diagnosis present

## 2015-04-13 DIAGNOSIS — T68XXXD Hypothermia, subsequent encounter: Secondary | ICD-10-CM | POA: Diagnosis not present

## 2015-04-13 DIAGNOSIS — I2510 Atherosclerotic heart disease of native coronary artery without angina pectoris: Secondary | ICD-10-CM | POA: Diagnosis present

## 2015-04-13 DIAGNOSIS — Z79899 Other long term (current) drug therapy: Secondary | ICD-10-CM

## 2015-04-13 DIAGNOSIS — I1310 Hypertensive heart and chronic kidney disease without heart failure, with stage 1 through stage 4 chronic kidney disease, or unspecified chronic kidney disease: Secondary | ICD-10-CM | POA: Diagnosis present

## 2015-04-13 DIAGNOSIS — Z882 Allergy status to sulfonamides status: Secondary | ICD-10-CM | POA: Diagnosis not present

## 2015-04-13 DIAGNOSIS — D62 Acute posthemorrhagic anemia: Secondary | ICD-10-CM | POA: Diagnosis not present

## 2015-04-13 DIAGNOSIS — S0003XA Contusion of scalp, initial encounter: Secondary | ICD-10-CM | POA: Diagnosis present

## 2015-04-13 DIAGNOSIS — R0602 Shortness of breath: Secondary | ICD-10-CM | POA: Diagnosis not present

## 2015-04-13 DIAGNOSIS — D72829 Elevated white blood cell count, unspecified: Secondary | ICD-10-CM

## 2015-04-13 DIAGNOSIS — S098XXA Other specified injuries of head, initial encounter: Secondary | ICD-10-CM | POA: Diagnosis not present

## 2015-04-13 DIAGNOSIS — W19XXXA Unspecified fall, initial encounter: Secondary | ICD-10-CM | POA: Diagnosis present

## 2015-04-13 DIAGNOSIS — E785 Hyperlipidemia, unspecified: Secondary | ICD-10-CM | POA: Diagnosis present

## 2015-04-13 DIAGNOSIS — I442 Atrioventricular block, complete: Secondary | ICD-10-CM | POA: Diagnosis present

## 2015-04-13 DIAGNOSIS — Z9181 History of falling: Secondary | ICD-10-CM | POA: Diagnosis not present

## 2015-04-13 DIAGNOSIS — Z7902 Long term (current) use of antithrombotics/antiplatelets: Secondary | ICD-10-CM | POA: Diagnosis not present

## 2015-04-13 DIAGNOSIS — I959 Hypotension, unspecified: Secondary | ICD-10-CM | POA: Diagnosis present

## 2015-04-13 DIAGNOSIS — R488 Other symbolic dysfunctions: Secondary | ICD-10-CM | POA: Diagnosis not present

## 2015-04-13 DIAGNOSIS — E876 Hypokalemia: Secondary | ICD-10-CM | POA: Diagnosis not present

## 2015-04-13 DIAGNOSIS — R509 Fever, unspecified: Secondary | ICD-10-CM | POA: Diagnosis not present

## 2015-04-13 DIAGNOSIS — E875 Hyperkalemia: Secondary | ICD-10-CM | POA: Diagnosis present

## 2015-04-13 DIAGNOSIS — I5032 Chronic diastolic (congestive) heart failure: Secondary | ICD-10-CM | POA: Diagnosis not present

## 2015-04-13 DIAGNOSIS — S0003XD Contusion of scalp, subsequent encounter: Secondary | ICD-10-CM | POA: Diagnosis not present

## 2015-04-13 DIAGNOSIS — R2689 Other abnormalities of gait and mobility: Secondary | ICD-10-CM | POA: Diagnosis not present

## 2015-04-13 DIAGNOSIS — I509 Heart failure, unspecified: Secondary | ICD-10-CM | POA: Diagnosis not present

## 2015-04-13 DIAGNOSIS — R4182 Altered mental status, unspecified: Secondary | ICD-10-CM | POA: Diagnosis not present

## 2015-04-13 DIAGNOSIS — Z9842 Cataract extraction status, left eye: Secondary | ICD-10-CM

## 2015-04-13 DIAGNOSIS — E1122 Type 2 diabetes mellitus with diabetic chronic kidney disease: Secondary | ICD-10-CM | POA: Diagnosis present

## 2015-04-13 DIAGNOSIS — R7881 Bacteremia: Secondary | ICD-10-CM | POA: Diagnosis present

## 2015-04-13 DIAGNOSIS — Z823 Family history of stroke: Secondary | ICD-10-CM

## 2015-04-13 DIAGNOSIS — R2681 Unsteadiness on feet: Secondary | ICD-10-CM | POA: Diagnosis not present

## 2015-04-13 DIAGNOSIS — G9349 Other encephalopathy: Secondary | ICD-10-CM | POA: Diagnosis not present

## 2015-04-13 DIAGNOSIS — R58 Hemorrhage, not elsewhere classified: Secondary | ICD-10-CM | POA: Diagnosis not present

## 2015-04-13 DIAGNOSIS — E872 Acidosis: Secondary | ICD-10-CM | POA: Diagnosis present

## 2015-04-13 DIAGNOSIS — R54 Age-related physical debility: Secondary | ICD-10-CM | POA: Diagnosis present

## 2015-04-13 DIAGNOSIS — J9601 Acute respiratory failure with hypoxia: Secondary | ICD-10-CM | POA: Diagnosis present

## 2015-04-13 DIAGNOSIS — I495 Sick sinus syndrome: Secondary | ICD-10-CM | POA: Diagnosis present

## 2015-04-13 DIAGNOSIS — Z888 Allergy status to other drugs, medicaments and biological substances status: Secondary | ICD-10-CM | POA: Diagnosis not present

## 2015-04-13 DIAGNOSIS — F0391 Unspecified dementia with behavioral disturbance: Secondary | ICD-10-CM | POA: Diagnosis not present

## 2015-04-13 LAB — URINALYSIS, ROUTINE W REFLEX MICROSCOPIC
Bilirubin Urine: NEGATIVE
Glucose, UA: NEGATIVE mg/dL
Hgb urine dipstick: NEGATIVE
Ketones, ur: NEGATIVE mg/dL
LEUKOCYTES UA: NEGATIVE
NITRITE: NEGATIVE
PROTEIN: NEGATIVE mg/dL
Specific Gravity, Urine: 1.017 (ref 1.005–1.030)
UROBILINOGEN UA: 1 mg/dL (ref 0.0–1.0)
pH: 6 (ref 5.0–8.0)

## 2015-04-13 LAB — CBC
HCT: 32.9 % — ABNORMAL LOW (ref 36.0–46.0)
HEMOGLOBIN: 10.6 g/dL — AB (ref 12.0–15.0)
MCH: 31.5 pg (ref 26.0–34.0)
MCHC: 32.2 g/dL (ref 30.0–36.0)
MCV: 97.6 fL (ref 78.0–100.0)
PLATELETS: 395 10*3/uL (ref 150–400)
RBC: 3.37 MIL/uL — AB (ref 3.87–5.11)
RDW: 14.3 % (ref 11.5–15.5)
WBC: 18.8 10*3/uL — AB (ref 4.0–10.5)

## 2015-04-13 LAB — CBG MONITORING, ED: Glucose-Capillary: 250 mg/dL — ABNORMAL HIGH (ref 65–99)

## 2015-04-13 LAB — PROTIME-INR
INR: 1.4 (ref 0.00–1.49)
PROTHROMBIN TIME: 17.2 s — AB (ref 11.6–15.2)

## 2015-04-13 LAB — COMPREHENSIVE METABOLIC PANEL
ALBUMIN: 2.7 g/dL — AB (ref 3.5–5.0)
ALT: 8 U/L — ABNORMAL LOW (ref 14–54)
ALT: 9 U/L — ABNORMAL LOW (ref 14–54)
ANION GAP: 9 (ref 5–15)
AST: 28 U/L (ref 15–41)
AST: 19 U/L (ref 15–41)
Albumin: 2.5 g/dL — ABNORMAL LOW (ref 3.5–5.0)
Alkaline Phosphatase: 48 U/L (ref 38–126)
Alkaline Phosphatase: 46 U/L (ref 38–126)
Anion gap: 7 (ref 5–15)
BUN: 30 mg/dL — AB (ref 6–20)
BUN: 30 mg/dL — ABNORMAL HIGH (ref 6–20)
CHLORIDE: 109 mmol/L (ref 101–111)
CO2: 21 mmol/L — AB (ref 22–32)
CO2: 22 mmol/L (ref 22–32)
Calcium: 8.1 mg/dL — ABNORMAL LOW (ref 8.9–10.3)
Calcium: 7.9 mg/dL — ABNORMAL LOW (ref 8.9–10.3)
Chloride: 106 mmol/L (ref 101–111)
Creatinine, Ser: 1.4 mg/dL — ABNORMAL HIGH (ref 0.44–1.00)
Creatinine, Ser: 1.29 mg/dL — ABNORMAL HIGH (ref 0.44–1.00)
GFR calc Af Amer: 36 mL/min — ABNORMAL LOW (ref >60)
GFR calc non Af Amer: 31 mL/min — ABNORMAL LOW (ref >60)
GFR, EST AFRICAN AMERICAN: 40 mL/min — AB (ref >60)
GFR, EST NON AFRICAN AMERICAN: 34 mL/min — AB (ref >60)
GLUCOSE: 298 mg/dL — AB (ref 65–99)
Glucose, Bld: 278 mg/dL — ABNORMAL HIGH (ref 65–99)
POTASSIUM: 6.3 mmol/L — AB (ref 3.5–5.1)
POTASSIUM: 5.6 mmol/L — AB (ref 3.5–5.1)
SODIUM: 136 mmol/L (ref 135–145)
SODIUM: 138 mmol/L (ref 135–145)
Total Bilirubin: 1 mg/dL (ref 0.3–1.2)
Total Bilirubin: 0.9 mg/dL (ref 0.3–1.2)
Total Protein: 4.7 g/dL — ABNORMAL LOW (ref 6.5–8.1)
Total Protein: 5.1 g/dL — ABNORMAL LOW (ref 6.5–8.1)

## 2015-04-13 LAB — BASIC METABOLIC PANEL
ANION GAP: 10 (ref 5–15)
BUN: 30 mg/dL — ABNORMAL HIGH (ref 6–20)
CHLORIDE: 106 mmol/L (ref 101–111)
CO2: 19 mmol/L — ABNORMAL LOW (ref 22–32)
Calcium: 7.6 mg/dL — ABNORMAL LOW (ref 8.9–10.3)
Creatinine, Ser: 1.18 mg/dL — ABNORMAL HIGH (ref 0.44–1.00)
GFR, EST AFRICAN AMERICAN: 44 mL/min — AB (ref >60)
GFR, EST NON AFRICAN AMERICAN: 38 mL/min — AB (ref >60)
Glucose, Bld: 289 mg/dL — ABNORMAL HIGH (ref 65–99)
POTASSIUM: 5.1 mmol/L (ref 3.5–5.1)
SODIUM: 135 mmol/L (ref 135–145)

## 2015-04-13 LAB — GRAM STAIN: SPECIAL REQUESTS: NORMAL

## 2015-04-13 LAB — I-STAT TROPONIN, ED: Troponin i, poc: 0 ng/mL (ref 0.00–0.08)

## 2015-04-13 LAB — I-STAT CG4 LACTIC ACID, ED
LACTIC ACID, VENOUS: 2.22 mmol/L — AB (ref 0.5–2.0)
Lactic Acid, Venous: 2.79 mmol/L (ref 0.5–2.0)

## 2015-04-13 LAB — CK: Total CK: 57 U/L (ref 38–234)

## 2015-04-13 LAB — GLUCOSE, CAPILLARY: Glucose-Capillary: 179 mg/dL — ABNORMAL HIGH (ref 65–99)

## 2015-04-13 LAB — MRSA PCR SCREENING: MRSA by PCR: POSITIVE — AB

## 2015-04-13 LAB — ABO/RH: ABO/RH(D): AB NEG

## 2015-04-13 LAB — APTT: aPTT: 28 seconds (ref 24–37)

## 2015-04-13 MED ORDER — PIPERACILLIN-TAZOBACTAM IN DEX 2-0.25 GM/50ML IV SOLN
2.2500 g | Freq: Three times a day (TID) | INTRAVENOUS | Status: AC
Start: 2015-04-13 — End: 2015-04-16
  Administered 2015-04-13 – 2015-04-16 (×10): 2.25 g via INTRAVENOUS
  Filled 2015-04-13 (×13): qty 50

## 2015-04-13 MED ORDER — PIPERACILLIN-TAZOBACTAM 3.375 G IVPB 30 MIN
3.3750 g | Freq: Once | INTRAVENOUS | Status: AC
  Administered 2015-04-13: 3.375 g via INTRAVENOUS
  Filled 2015-04-13: qty 50

## 2015-04-13 MED ORDER — INSULIN ASPART 100 UNIT/ML ~~LOC~~ SOLN: 0.0000 [IU] | Freq: Three times a day (TID) | SUBCUTANEOUS | Status: DC

## 2015-04-13 MED ORDER — SODIUM CHLORIDE 0.9 % IV BOLUS (SEPSIS)
1000.0000 mL | Freq: Once | INTRAVENOUS | Status: AC
  Administered 2015-04-13: 1000 mL via INTRAVENOUS

## 2015-04-13 MED ORDER — VANCOMYCIN HCL 500 MG IV SOLR
500.0000 mg | INTRAVENOUS | Status: DC
  Administered 2015-04-15: 500 mg via INTRAVENOUS
  Filled 2015-04-13: qty 500

## 2015-04-13 MED ORDER — OFLOXACIN 0.3 % OP SOLN: 1.0000 [drp] | Freq: Every day | OPHTHALMIC | Status: DC

## 2015-04-13 MED ORDER — SODIUM CHLORIDE 0.9 % IV SOLN
INTRAVENOUS | Status: DC
  Administered 2015-04-13: 20:00:00 via INTRAVENOUS

## 2015-04-13 MED ORDER — SODIUM CHLORIDE 0.9 % IJ SOLN
3.0000 mL | Freq: Two times a day (BID) | INTRAMUSCULAR | Status: DC
  Administered 2015-04-14 – 2015-04-17 (×6): 3 mL via INTRAVENOUS

## 2015-04-13 MED ORDER — SODIUM CHLORIDE 0.9 % IV BOLUS (SEPSIS): 500.0000 mL | INTRAVENOUS | Status: DC

## 2015-04-13 MED ORDER — VANCOMYCIN HCL IN DEXTROSE 1-5 GM/200ML-% IV SOLN
1000.0000 mg | Freq: Once | INTRAVENOUS | Status: AC
  Administered 2015-04-13: 1000 mg via INTRAVENOUS
  Filled 2015-04-13: qty 200

## 2015-04-13 MED ORDER — ACETAMINOPHEN 650 MG RE SUPP: 650.0000 mg | Freq: Four times a day (QID) | RECTAL | Status: DC | PRN

## 2015-04-13 MED ORDER — ACETAMINOPHEN 325 MG PO TABS
650.0000 mg | ORAL_TABLET | Freq: Four times a day (QID) | ORAL | Status: DC | PRN
  Administered 2015-04-13 – 2015-04-15 (×4): 650 mg via ORAL
  Filled 2015-04-13 (×4): qty 2

## 2015-04-13 NOTE — ED Notes (Addendum)
Pt in from home via [facility], per report pt was found by RN that comes to her home to check on her this morning, pt was found in her bed with a puddle of blood in the bathroom floor, pt reported to  Have crawled to the bed, unknown time of fall, pt last seen by same nurse last night @ 20:30, pt has hematoma to posterior head, with bleeding controlled at this time, pt takes Plavix, pt ventricular paced in route, Per EMS pt was answering questions appropriately in route, upon arrival to ED pt is not answering questions appropriately, pts c spine secured with towel rolls by EMS

## 2015-04-13 NOTE — ED Notes (Signed)
Report called to floor but inpatient nurse reports that no bed is available at this time but she will call when new bed is assigned and cleaned.

## 2015-04-13 NOTE — ED Notes (Signed)
Pt placed in gown and in bed. Pt monitored by pulse ox, bp cuff, and 12-lead. 

## 2015-04-13 NOTE — ED Notes (Signed)
CBG = 250  Erin RN informed of result.

## 2015-04-13 NOTE — ED Notes (Signed)
Attempted to call report to floor.  Nurse unavailable but will return phone call

## 2015-04-13 NOTE — ED Notes (Signed)
Pt remains on bare hugger at this time.

## 2015-04-13 NOTE — Progress Notes (Signed)
ANTIBIOTIC CONSULT NOTE - INITIAL  Pharmacy Consult for vanc/zosyn Indication: rule out sepsis  Allergies  Allergen Reactions  . Ciprofloxacin Nausea And Vomiting  . Crestor [Rosuvastatin Calcium] Swelling  . Escitalopram Oxalate Other (See Comments)    Yawning and insomnia   . Lipitor [Atorvastatin Calcium] Other (See Comments)    myalgia  . Metronidazole Nausea And Vomiting  . Sulfa Antibiotics Swelling    Patient Measurements:     Vital Signs: Temp: 96.3 F (35.7 C) (09/16 1139) Temp Source: Oral (09/16 1139) BP: 94/44 mmHg (09/16 1139) Pulse Rate: 74 (09/16 1200) Intake/Output from previous day:   Intake/Output from this shift:    Labs:  Recent Labs  04/13/15 1209  WBC 18.8*  HGB 10.6*  PLT 395  CREATININE 1.40*   CrCl cannot be calculated (Unknown ideal weight.). No results for input(s): VANCOTROUGH, VANCOPEAK, VANCORANDOM, GENTTROUGH, GENTPEAK, GENTRANDOM, TOBRATROUGH, TOBRAPEAK, TOBRARND, AMIKACINPEAK, AMIKACINTROU, AMIKACIN in the last 72 hours.   Microbiology: No results found for this or any previous visit (from the past 720 hour(s)).  Medical History: Past Medical History  Diagnosis Date  . Complete heart block 05/08/2011  . CAD (coronary artery disease) 05/08/2011  . Hypertensive heart disease without congestive heart failure 05/08/2011  . Hyponatremia 05/08/2011  . Hyperlipemia 05/08/2011  . Type 2 diabetes 05/08/2011  . Murmur, cardiac 05/08/2011  . Osteoarthritis   . Anxiety   . Stroke     Assessment: 6 yof found in bed with puddle of blood, has hematoma to posterior head, bleeding controlled. Plavix pta. Pharmacy consulted to dose vanc/zosyn for sepsis. Low temps, wbc 18.8 on admit. SCr 1.4 on admit (baseline 0.8-0.9), CrCl~17. Wt~45kg. 1x doses given in ED  9/16 vanc>> 9/16 zosyn>>  9/16 BCx2>>   Goal of Therapy:  Vancomycin trough level 15-20 mcg/ml  Plan:  Vanc 1g IV x 1 dose; then Vanc  IV q48h Zosyn 2.25 IV  q8h Mon clinical progress, c/s, renal function, abx plan VT@SS  as indicated  Babs Bertin, PharmD Clinical Pharmacist Pager [phone] 04/13/2015 12:48 PM

## 2015-04-13 NOTE — ED Provider Notes (Signed)
CSN: [id number]     Arrival date & time 04/13/15  1120 History   First MD Initiated Contact with Patient 04/13/15 1137     Chief Complaint  Patient presents with  . Fall    Patient is a 81 y.o. female presenting with general illness. The history is provided by the patient. No language interpreter was used.  Illness Location:  NA Quality:  Fall Severity:  Moderate Onset quality:  Sudden Timing:  Unable to specify Progression:  Unable to specify Chronicity:  New Context:  PMHx of HLD, HTN, DM, CAD, CVA, & dementia presenting from home with fall. Lives at home alone. Nurse living close by checks on patient daily. Last known normal in good health yesterday evening. Patient believes she awoke to use restroom yesterday evening and fell in bathroom. Was found in bed this morning by nurse. EMS called. Patient alert and oriented for EMS. Initial BP 80s over palp. Given normal saline in route. Taking Plavix. Associated symptoms: headaches   Associated symptoms: no abdominal pain, no chest pain, no cough, no fever, no nausea, no shortness of breath and no vomiting  Loss of consciousness: unknown.     Past Medical History  Diagnosis Date  . Complete heart block 05/08/2011  . CAD (coronary artery disease) 05/08/2011  . Hypertensive heart disease without congestive heart failure 05/08/2011  . Hyponatremia 05/08/2011  . Hyperlipemia 05/08/2011  . Type 2 diabetes 05/08/2011  . Murmur, cardiac 05/08/2011  . Osteoarthritis   . Anxiety   . Stroke    Past Surgical History  Procedure Laterality Date  . Tonsillectomy    . Left total knee replacement    . Abdominal hysterectomy    . Cataract extraction, bilateral    . Right shoulder hemiarthroplasty  2005    [name]  . Pacemaker insertion     Family History  Problem Relation Age of Onset  . Stroke Mother    Social History  Substance Use Topics  . Smoking status: Never Smoker   . Smokeless tobacco: Never Used  . Alcohol Use: No    OB History    No data available     Review of Systems  Constitutional: Negative for fever and chills.  Respiratory: Negative for cough and shortness of breath.   Cardiovascular: Negative for chest pain.  Gastrointestinal: Negative for nausea, vomiting and abdominal pain.  Skin: Positive for wound.  Neurological: Positive for headaches. Loss of consciousness: unknown.  All other systems reviewed and are negative.   Allergies  Ciprofloxacin; Crestor; Escitalopram oxalate; Lipitor; Metronidazole; and Sulfa antibiotics  Home Medications   Prior to Admission medications   Medication Sig Start Date End Date Taking? Authorizing Provider  acetaminophen (TYLENOL) 500 MG tablet Take 500 mg by mouth every 6 (six) hours as needed for mild pain.   Yes Historical Provider, MD  bisacodyl (DULCOLAX) 5 MG EC tablet Take 10 mg by mouth daily as needed. For constipation   Yes Historical Provider, MD  carvedilol (COREG CR) 40 MG 24 hr capsule Take 40 mg by mouth daily.     Yes Historical Provider, MD  ciprofloxacin (CILOXAN) 0.3 % ophthalmic solution Place 1 drop into both eyes 4 (four) times daily - after meals and at bedtime. 03/14/15  Yes Historical Provider, MD  clopidogrel (PLAVIX) 75 MG tablet Take 75 mg by mouth daily.   Yes Historical Provider, MD  fluticasone (FLONASE) 50 MCG/ACT nasal spray Place 1 spray into both nostrils daily.   Yes Historical Provider, MD  lisinopril (PRINIVIL,ZESTRIL) 40 MG tablet Take 40 mg by mouth daily.     Yes Historical Provider, MD  meclizine (ANTIVERT) 25 MG tablet Take 25 mg by mouth daily.   Yes Historical Provider, MD  metFORMIN (GLUCOPHAGE) 500 MG tablet Take 500 mg by mouth 2 (two) times daily with a meal.    Yes Historical Provider, MD   BP 135/55 mmHg  Pulse 69  Temp(Src) 97.7 F (36.5 C) (Rectal)  Resp 18  SpO2 96%   Physical Exam  Constitutional: No distress.  Elderly frail female lying in stretcher  HENT:  Right Ear: External ear normal.   Left Ear: External ear normal.  Pressure dressing in place of scalp, large hematoma in to occiput with surrounding dried blood, unable to identify open wound or active bleeding  Eyes: Conjunctivae are normal. Pupils are equal, round, and reactive to light.  Neck: Normal range of motion. Neck supple.  Cardiovascular: Normal rate and intact distal pulses.   BP 80s initially 90's/40's  Pulmonary/Chest: Effort normal. No respiratory distress.  Abdominal: Soft. Bowel sounds are normal. She exhibits no distension. There is no tenderness.  Musculoskeletal: Normal range of motion.  Neurological: She is alert.  Oriented to self  Skin: Skin is warm and dry. She is not diaphoretic.  Hematoma dried blood to occiput as noted above  Nursing note and vitals reviewed.   ED Course  Procedures   Labs Review Labs Reviewed  CBC - Abnormal; Notable for the following:    WBC 18.8 (*)    RBC 3.37 (*)    Hemoglobin 10.6 (*)    HCT 32.9 (*)    All other components within normal limits  PROTIME-INR - Abnormal; Notable for the following:    Prothrombin Time 17.2 (*)    All other components within normal limits  COMPREHENSIVE METABOLIC PANEL - Abnormal; Notable for the following:    Potassium 6.3 (*)    CO2 21 (*)    Glucose, Bld 298 (*)    BUN 30 (*)    Creatinine, Ser 1.40 (*)    Calcium 8.1 (*)    Total Protein 4.7 (*)    Albumin 2.7 (*)    ALT 8 (*)    GFR calc non Af Amer 31 (*)    GFR calc Af Amer 36 (*)    All other components within normal limits  COMPREHENSIVE METABOLIC PANEL - Abnormal; Notable for the following:    Potassium 5.6 (*)    Glucose, Bld 278 (*)    BUN 30 (*)    Creatinine, Ser 1.29 (*)    Calcium 7.9 (*)    Total Protein 5.1 (*)    Albumin 2.5 (*)    ALT 9 (*)    GFR calc non Af Amer 34 (*)    GFR calc Af Amer 40 (*)    All other components within normal limits  BASIC METABOLIC PANEL - Abnormal; Notable for the following:    CO2 19 (*)    Glucose, Bld 289 (*)     BUN 30 (*)    Creatinine, Ser 1.18 (*)    Calcium 7.6 (*)    GFR calc non Af Amer 38 (*)    GFR calc Af Amer 44 (*)    All other components within normal limits  I-STAT CG4 LACTIC ACID, ED - Abnormal; Notable for the following:    Lactic Acid, Venous 2.79 (*)    All other components within normal limits  CBG MONITORING, ED -  Abnormal; Notable for the following:    Glucose-Capillary 250 (*)    All other components within normal limits  I-STAT CG4 LACTIC ACID, ED - Abnormal; Notable for the following:    Lactic Acid, Venous 2.22 (*)    All other components within normal limits  CULTURE, BLOOD (ROUTINE X 2)  GRAM STAIN  CULTURE, BLOOD (ROUTINE X 2)  CK  APTT  URINALYSIS, ROUTINE W REFLEX MICROSCOPIC (NOT AT [facility])  I-STAT TROPOININ, ED  TYPE AND SCREEN  ABO/RH   Imaging Review Ct Head Wo Contrast  04/13/2015   CLINICAL DATA:  Fall today, hematoma left posterior head, decreased level of consciousness  EXAM: CT HEAD WITHOUT CONTRAST  CT CERVICAL SPINE WITHOUT CONTRAST  TECHNIQUE: Multidetector CT imaging of the head and cervical spine was performed following the standard protocol without intravenous contrast. Multiplanar CT image reconstructions of the cervical spine were also generated.  COMPARISON:  03/23/2012  FINDINGS: CT HEAD FINDINGS  Posterior left scalp hematoma. No intracranial hemorrhage or extra-axial fluid. No evidence of vascular territory infarct. Severe atrophy and white matter disease. Probable lacunar infarct left thalamus and left basal ganglia, with a subacute to chronic appearance but not 1,013.  No skull fracture.  CT CERVICAL SPINE FINDINGS  Levoscoliosis. Severe degenerative disc disease throughout the cervical spine. Severe facet disease also present. No fracture identified. Mild anterior listhesis of C4 on C5. Mild anterior listhesis of C7 on T1. Severe degenerative disc disease at C5-6 and C6-7.  IMPRESSION: Severe chronic change in both the head and cervical spine with  no acute traumatic injury other than left scalp hematoma.   Electronically Signed   By: Esperanza Heir M.D.   On: 04/13/2015 13:10   Ct Cervical Spine Wo Contrast  04/13/2015   CLINICAL DATA:  Fall today, hematoma left posterior head, decreased level of consciousness  EXAM: CT HEAD WITHOUT CONTRAST  CT CERVICAL SPINE WITHOUT CONTRAST  TECHNIQUE: Multidetector CT imaging of the head and cervical spine was performed following the standard protocol without intravenous contrast. Multiplanar CT image reconstructions of the cervical spine were also generated.  COMPARISON:  03/23/2012  FINDINGS: CT HEAD FINDINGS  Posterior left scalp hematoma. No intracranial hemorrhage or extra-axial fluid. No evidence of vascular territory infarct. Severe atrophy and white matter disease. Probable lacunar infarct left thalamus and left basal ganglia, with a subacute to chronic appearance but not 1,013.  No skull fracture.  CT CERVICAL SPINE FINDINGS  Levoscoliosis. Severe degenerative disc disease throughout the cervical spine. Severe facet disease also present. No fracture identified. Mild anterior listhesis of C4 on C5. Mild anterior listhesis of C7 on T1. Severe degenerative disc disease at C5-6 and C6-7.  IMPRESSION: Severe chronic change in both the head and cervical spine with no acute traumatic injury other than left scalp hematoma.   Electronically Signed   By: Esperanza Heir M.D.   On: 04/13/2015 13:10   Dg Chest Portable 1 View  04/13/2015   CLINICAL DATA:  Altered mental status, fall  EXAM: PORTABLE CHEST - 1 VIEW  COMPARISON:  12/15/2011  FINDINGS: Mild cardiac enlargement stable. Atherosclerotic aortic calcifications stable. Cardiac pacer stable. Bronchitic change stable. No consolidation or effusion.  IMPRESSION: Stable chronic findings with no acute process   Electronically Signed   By: Esperanza Heir M.D.   On: 04/13/2015 12:34   I have personally reviewed and evaluated these images and lab results as part of  my medical decision-making.   EKG Interpretation None  MDM  Ms. Dubas is a 81 yo female w/ PMHx of HLD, HTN, DM, CAD, CVA, & dementia presenting from home with fall. Lives at home alone. Nurse (granddaughter) living close by checks on patient multiple times per day. Last known normal in good health yesterday evening. Patient believes she awoke to use restroom yesterday evening and fell in bathroom. Was found in bed this morning by nurse with blood around her hed. EMS called. Patient alert for EMS. Initial BP 80's over palp. Given normal saline in route. Taking Plavix.  Exam above notable for elderly frail female lying in stretcher. Hypothermic to 96.70F - blankets and bear hugger placed. Pressure dressing to his scalp. Large hematoma to occiput with surrounding dried blood but no active bleeding. Breathing well on RA and maintaining saturations without supplemental oxygen. Lungs CTAB. Abd benign. CV RRR. Remainder of examination unremarkable.  POCT glucose 250. EKG showing no ST elevation/depression. I-STAT troponin undetectable. I-STAT lactic acid 2.79. WBC 18.8. Given the associated hypothermia and hypotension, code sepsis called. Blood and urine cultures drawn. Patient covered with empiric broad-spectrum antibiotics (Vancomycin and Zosyn). IV fluids given repeat lactic acid 2.2. Creatinine 1.4 - chart review revealing baseline at 0.8 to 0.9, representing an AKI. BUN 30. Hemoglobin 10.6 - chart review revealing baseline at 14, type and screen sent but does not meet criteria for acute transfusion. CK 57 - no concern for rhabdomyolysis at this time. INR 1.4 (patient taking Plavix at home). CT head showing no acute intracranial abnormality but noting large scalp hematoma. Laboratory and imaging results were personally reviewed by myself and used in the medical decision making of this patient's treatment and disposition.  She admitted to hospitalist service for further evaluation and  management of hypotension, hypothermia, lactic acidosis, and leukocytosis with concern for sepsis versus severe hypovolemia secondary to hemorrhaging. Patient and family understand and agree with the plan have no further questions or concerns this time.  Pt care discussed with and followed by my attending, Dr. Rolan Bucco  Angelina Ok, MD Pager [phone]  Final diagnoses:  Fall, initial encounter  Scalp hematoma, initial encounter  Sepsis, due to unspecified organism  Leukocytosis    Angelina Ok, MD 04/13/15 1639  Rolan Bucco, MD 04/14/15 4098  Rolan Bucco, MD 04/14/15 1191

## 2015-04-13 NOTE — H&P (Addendum)
[facility] History and Physical  Lynn Weeks ZOX:[id number] DOB: Feb 06, 1919 DOA: 04/13/2015  Referring physician:  PCP: Ailene Ravel, MD  Specialists:   Chief Complaint: fall   HPI: Lynn Weeks is a 81 y.o. female with PMH of  DM, HPL, h/o CVA, SSS s/p PPM presented after a fall at home. Patient lives alone, last know well normal health yesterday evening. Per her granddaughter (who is a Engineer, civil (consulting)), patient tried to use restroom yesterday evening and fell in bathroom. Was found in bed this morning by nurse. EMS called. Patient is pleasantly demented. She denies acute chest pains, no SOB, no cough, no nausea, vomiting no abdominal pains, but reports some mild diarrhea. She also denies any focal weakness. -ED: patient found to have large occipital hematoma. Hypothermic. She is started on IV atx for possible sepsis     Review of Systems: The patient denies anorexia, fever, weight loss,, vision loss, decreased hearing, hoarseness, chest pain, syncope, dyspnea on exertion, peripheral edema, balance deficits, hemoptysis, abdominal pain, melena, hematochezia, severe indigestion/heartburn, hematuria, incontinence, genital sores, muscle weakness, suspicious skin lesions, transient blindness, difficulty walking, depression, unusual weight change, abnormal bleeding, enlarged lymph nodes, angioedema, and breast masses.    Past Medical History  Diagnosis Date  . Complete heart block 05/08/2011  . CAD (coronary artery disease) 05/08/2011  . Hypertensive heart disease without congestive heart failure 05/08/2011  . Hyponatremia 05/08/2011  . Hyperlipemia 05/08/2011  . Type 2 diabetes 05/08/2011  . Murmur, cardiac 05/08/2011  . Osteoarthritis   . Anxiety   . Stroke    Past Surgical History  Procedure Laterality Date  . Tonsillectomy    . Left total knee replacement    . Abdominal hysterectomy    . Cataract extraction, bilateral    . Right shoulder hemiarthroplasty  2005     [name]  . Pacemaker insertion     Social History:  reports that she has never smoked. She has never used smokeless tobacco. She reports that she does not drink alcohol or use illicit drugs. Home;  where does patient live--home, ALF, SNF? and with whom if at home? Noe;  Can patient participate in ADLs?  Allergies  Allergen Reactions  . Ciprofloxacin Nausea And Vomiting  . Crestor [Rosuvastatin Calcium] Swelling  . Escitalopram Oxalate Other (See Comments)    Yawning and insomnia   . Lipitor [Atorvastatin Calcium] Other (See Comments)    myalgia  . Metronidazole Nausea And Vomiting  . Sulfa Antibiotics Swelling    Family History  Problem Relation Age of Onset  . Stroke Mother     (be sure to complete)  Prior to Admission medications   Medication Sig Start Date End Date Taking? Authorizing Provider  amLODipine (NORVASC) 5 MG tablet Take 5 mg by mouth daily.    Historical Provider, MD  bisacodyl (DULCOLAX) 5 MG EC tablet Take 10 mg by mouth daily as needed. For constipation    Historical Provider, MD  carvedilol (COREG CR) 40 MG 24 hr capsule Take 40 mg by mouth daily.      Historical Provider, MD  lisinopril (PRINIVIL,ZESTRIL) 40 MG tablet Take 40 mg by mouth daily.      Historical Provider, MD  metFORMIN (GLUCOPHAGE) 500 MG tablet Take 500 mg by mouth 2 (two) times daily with a meal.     Historical Provider, MD  ofloxacin (OCUFLOX) 0.3 % ophthalmic solution Place 1 drop into both eyes daily.     Historical Provider, MD  simvastatin (ZOCOR) 20 MG tablet  Take 20 mg by mouth at bedtime.      Historical Provider, MD   Physical Exam: Filed Vitals:   04/13/15 1500  BP: 112/41  Pulse: 64  Temp:   Resp: 16     General:  Alert, confused at baseline   Eyes: L eye blind   ENT: no oral ulcers   Neck: supple   Cardiovascular: s1,s2 rrr  Respiratory: CTA BL  Abdomen: soft, nt,nd   Skin: few ecchymosis   Musculoskeletal: no leg edema   Psychiatric: no hallucinations    Neurologic: CN 2-12 intact. Motor is symmetric BL  Labs on Admission:  Basic Metabolic Panel:  Recent Labs Lab 04/13/15 1209 04/13/15 1250  NA 136 138  K 6.3* 5.6*  CL 106 109  CO2 21* 22  GLUCOSE 298* 278*  BUN 30* 30*  CREATININE 1.40* 1.29*  CALCIUM 8.1* 7.9*   Liver Function Tests:  Recent Labs Lab 04/13/15 1209 04/13/15 1250  AST 28 19  ALT 8* 9*  ALKPHOS 48 46  BILITOT 1.0 0.9  PROT 4.7* 5.1*  ALBUMIN 2.7* 2.5*   No results for input(s): LIPASE, AMYLASE in the last 168 hours. No results for input(s): AMMONIA in the last 168 hours. CBC:  Recent Labs Lab 04/13/15 1209  WBC 18.8*  HGB 10.6*  HCT 32.9*  MCV 97.6  PLT 395   Cardiac Enzymes:  Recent Labs Lab 04/13/15 1209  CKTOTAL 57    BNP (last 3 results) No results for input(s): BNP in the last 8760 hours.  ProBNP (last 3 results) No results for input(s): PROBNP in the last 8760 hours.  CBG:  Recent Labs Lab 04/13/15 1201  GLUCAP 250*    Radiological Exams on Admission: Ct Head Wo Contrast  04/13/2015   CLINICAL DATA:  Fall today, hematoma left posterior head, decreased level of consciousness  EXAM: CT HEAD WITHOUT CONTRAST  CT CERVICAL SPINE WITHOUT CONTRAST  TECHNIQUE: Multidetector CT imaging of the head and cervical spine was performed following the standard protocol without intravenous contrast. Multiplanar CT image reconstructions of the cervical spine were also generated.  COMPARISON:  03/23/2012  FINDINGS: CT HEAD FINDINGS  Posterior left scalp hematoma. No intracranial hemorrhage or extra-axial fluid. No evidence of vascular territory infarct. Severe atrophy and white matter disease. Probable lacunar infarct left thalamus and left basal ganglia, with a subacute to chronic appearance but not 1,013.  No skull fracture.  CT CERVICAL SPINE FINDINGS  Levoscoliosis. Severe degenerative disc disease throughout the cervical spine. Severe facet disease also present. No fracture identified.  Mild anterior listhesis of C4 on C5. Mild anterior listhesis of C7 on T1. Severe degenerative disc disease at C5-6 and C6-7.  IMPRESSION: Severe chronic change in both the head and cervical spine with no acute traumatic injury other than left scalp hematoma.   Electronically Signed   By: Esperanza Heir M.D.   On: 04/13/2015 13:10   Ct Cervical Spine Wo Contrast  04/13/2015   CLINICAL DATA:  Fall today, hematoma left posterior head, decreased level of consciousness  EXAM: CT HEAD WITHOUT CONTRAST  CT CERVICAL SPINE WITHOUT CONTRAST  TECHNIQUE: Multidetector CT imaging of the head and cervical spine was performed following the standard protocol without intravenous contrast. Multiplanar CT image reconstructions of the cervical spine were also generated.  COMPARISON:  03/23/2012  FINDINGS: CT HEAD FINDINGS  Posterior left scalp hematoma. No intracranial hemorrhage or extra-axial fluid. No evidence of vascular territory infarct. Severe atrophy and white matter disease. Probable lacunar infarct  left thalamus and left basal ganglia, with a subacute to chronic appearance but not 1,013.  No skull fracture.  CT CERVICAL SPINE FINDINGS  Levoscoliosis. Severe degenerative disc disease throughout the cervical spine. Severe facet disease also present. No fracture identified. Mild anterior listhesis of C4 on C5. Mild anterior listhesis of C7 on T1. Severe degenerative disc disease at C5-6 and C6-7.  IMPRESSION: Severe chronic change in both the head and cervical spine with no acute traumatic injury other than left scalp hematoma.   Electronically Signed   By: Esperanza Heir M.D.   On: 04/13/2015 13:10   Dg Chest Portable 1 View  04/13/2015   CLINICAL DATA:  Altered mental status, fall  EXAM: PORTABLE CHEST - 1 VIEW  COMPARISON:  12/15/2011  FINDINGS: Mild cardiac enlargement stable. Atherosclerotic aortic calcifications stable. Cardiac pacer stable. Bronchitic change stable. No consolidation or effusion.  IMPRESSION:  Stable chronic findings with no acute process   Electronically Signed   By: Esperanza Heir M.D.   On: 04/13/2015 12:34    EKG: Independently reviewed.   Assessment/Plan Active Problems:   Sepsis   Hypothermia  81 y.o. female with PMH of  DM, HTN, HPL, h/o CVA, SSS s/p PPM presented after a fall at home.  -admitted with fall, scalp hematoma. possible sepsis, hypothermia, AKI   1. Hypothermia, leukocytosis, hypotensive with elevated lactic acid. Possible sepsis. No clear course of infection at this time. CXR: no clear infiltrates. abd exam is unremarkable.  -cont empiric IV atx, check UA. F/u cultures. Deescalate atx in 24-48 hrs if stable. But WBC is also chronically elevated ? underlying myeloproliferative disorder.    2. Fall. Suspected mechanical. Neuro exam is non focal . CT head: no acute infarcts, but probable old vs subacute thalamic, lacunar infarcts -obtain PT/OT. Patient is not on ASA. She is not a candidate for antiplatelets with large hematoma at this time  3. DM. Check ha1c. Cont ISS. Discontinue metformin due to acidosis, renal issues in elderly patient  4. AKI likely prerenal,  lisinopril. Hyperkalemia. Cont gentle IVF. Monitor I/O, urine output. Labs in AM  5. HTN. Hold BP meds due to hypotension   D/w patient, confirmed with her family. Patient is DNR  None.  if consultant consulted, please document name and whether formally or informally consulted  Code Status: DNR (must indicate code status--if unknown or must be presumed, indicate so) Family Communication: d/w patient, her son, family  (indicate person spoken with, if applicable, with phone number if by telephone) Disposition Plan: pend PT (indicate anticipated LOS)  Time spent: >45 minutes   Esperanza Sheets [facility] Pager [phone]  If 7PM-7AM, please contact night-coverage [email or web address] Password [email or web address] 04/13/2015, 3:12 PM

## 2015-04-14 ENCOUNTER — Inpatient Hospital Stay (HOSPITAL_COMMUNITY): Payer: Medicare Other

## 2015-04-14 ENCOUNTER — Encounter (HOSPITAL_COMMUNITY): Payer: Self-pay | Admitting: *Deleted

## 2015-04-14 DIAGNOSIS — N183 Chronic kidney disease, stage 3 unspecified: Secondary | ICD-10-CM

## 2015-04-14 DIAGNOSIS — S0003XA Contusion of scalp, initial encounter: Secondary | ICD-10-CM

## 2015-04-14 DIAGNOSIS — N179 Acute kidney failure, unspecified: Secondary | ICD-10-CM

## 2015-04-14 DIAGNOSIS — T68XXXD Hypothermia, subsequent encounter: Secondary | ICD-10-CM

## 2015-04-14 DIAGNOSIS — S0003XD Contusion of scalp, subsequent encounter: Secondary | ICD-10-CM

## 2015-04-14 DIAGNOSIS — G9340 Encephalopathy, unspecified: Secondary | ICD-10-CM

## 2015-04-14 DIAGNOSIS — I495 Sick sinus syndrome: Secondary | ICD-10-CM

## 2015-04-14 LAB — AMMONIA: Ammonia: <9 umol/L — ABNORMAL LOW (ref 9–35)

## 2015-04-14 LAB — CBC
HCT: 23.6 % — ABNORMAL LOW (ref 36.0–46.0)
Hemoglobin: 7.8 g/dL — ABNORMAL LOW (ref 12.0–15.0)
MCH: 32.4 pg (ref 26.0–34.0)
MCHC: 33.1 g/dL (ref 30.0–36.0)
MCV: 97.9 fL (ref 78.0–100.0)
PLATELETS: 293 10*3/uL (ref 150–400)
RBC: 2.41 MIL/uL — AB (ref 3.87–5.11)
RDW: 14.7 % (ref 11.5–15.5)
WBC: 14.1 10*3/uL — AB (ref 4.0–10.5)

## 2015-04-14 LAB — VITAMIN B12: VITAMIN B 12: 638 pg/mL (ref 180–914)

## 2015-04-14 LAB — FIBRINOGEN: FIBRINOGEN: 256 mg/dL (ref 204–475)

## 2015-04-14 LAB — IRON AND TIBC
Iron: 20 ug/dL — ABNORMAL LOW (ref 28–170)
SATURATION RATIOS: 8 % — AB (ref 10.4–31.8)
TIBC: 255 ug/dL (ref 250–450)
UIBC: 235 ug/dL

## 2015-04-14 LAB — BASIC METABOLIC PANEL
ANION GAP: 6 (ref 5–15)
BUN: 27 mg/dL — ABNORMAL HIGH (ref 6–20)
CALCIUM: 7.4 mg/dL — AB (ref 8.9–10.3)
CO2: 21 mmol/L — ABNORMAL LOW (ref 22–32)
Chloride: 109 mmol/L (ref 101–111)
Creatinine, Ser: 1.28 mg/dL — ABNORMAL HIGH (ref 0.44–1.00)
GFR, EST AFRICAN AMERICAN: 40 mL/min — AB (ref >60)
GFR, EST NON AFRICAN AMERICAN: 35 mL/min — AB (ref >60)
GLUCOSE: 152 mg/dL — AB (ref 65–99)
POTASSIUM: 4.1 mmol/L (ref 3.5–5.1)
SODIUM: 136 mmol/L (ref 135–145)

## 2015-04-14 LAB — GLUCOSE, CAPILLARY: GLUCOSE-CAPILLARY: 159 mg/dL — AB (ref 65–99)

## 2015-04-14 LAB — HEMOGLOBIN A1C
HEMOGLOBIN A1C: 6.8 % — AB (ref 4.8–5.6)
MEAN PLASMA GLUCOSE: 148 mg/dL

## 2015-04-14 LAB — FERRITIN: FERRITIN: 28 ng/mL (ref 11–307)

## 2015-04-14 LAB — TSH: TSH: 1.706 u[IU]/mL (ref 0.350–4.500)

## 2015-04-14 MED ORDER — MUPIROCIN 2 % EX OINT
1.0000 | TOPICAL_OINTMENT | Freq: Two times a day (BID) | CUTANEOUS | Status: AC
Start: 2015-04-14 — End: 2015-04-18
  Administered 2015-04-14 – 2015-04-18 (×10): 1 via NASAL
  Filled 2015-04-14 (×3): qty 22

## 2015-04-14 MED ORDER — SODIUM CHLORIDE 0.9 % IV SOLN
INTRAVENOUS | Status: AC
  Administered 2015-04-14: 21:00:00 via INTRAVENOUS

## 2015-04-14 MED ORDER — CHLORHEXIDINE GLUCONATE CLOTH 2 % EX PADS
6.0000 | MEDICATED_PAD | Freq: Every day | CUTANEOUS | Status: AC
  Administered 2015-04-14 – 2015-04-18 (×5): 6 via TOPICAL

## 2015-04-14 NOTE — Progress Notes (Signed)
PROGRESS NOTE  Lynn Weeks ZOX:[id number] DOB: Oct 09, 1918 DOA: 04/13/2015 PCP: Ailene Ravel, MD Brief history 81 year old female with a history of abuse mellitus, hyperlipidemia, stroke, sick sinus syndrome, dementia, complete heart block presented after an unwitnessed mechanical fall.  Due to the patient's acute encephalopathy. She is not able to provide any history. Apparently, it was surmised by the patient's granddaughter who is an Charity fundraiser that the patient had a fall in the bathroom on the evening of 04/12/2015. The patient was found lying in her bed awake with blood dripping from her posterior scalp that tracked from the bathroom on the morning of 04/13/2015 when she was found by her granddaughter. EMS was activated. In the emergency department, the patient was noted to be hypothermic. The patient was also noted to have lactic acidosis of 2.79. She was treated for presumptive sepsis and started on empiric antibiotics. Assessment/Plan: Hypothermia -Resolved -Check a.m. Cortisol -Continue antibiotics pending culture data -Chest x-ray negative for acute infiltrates or pulmonary edema -Urinalysis negative for pyuria -EKG is without any concerning ischemic changes  -Check TSH  Acute encephalopathy  -Multifactorial including acute on chronic renal failure, possible infectious process, hypotension, metabolic arrangements including hyperkalemia, and day-not reversion from hospitalization in the setting of underlying dementia  -Repeat CT brain to rule out delayed subdural hematoma  -Check ammonia, TSH, B12 , RBC folate -Urinalysis negative for pyuria Diabetes mellitus type 2, controlled -04/13/2015 hemoglobin A1c 6.8  -Discontinue sliding scale insulin  -Discontinue CBG checks  -Allow for liberal glycemic control  Lactic acidosis -Likely due to volume depletion as well as metformin use in the setting of baseline creatinine clearance approximately 30 -will not restart metformin  after discharge Acute on chronic renal failure (CKD stage IIII-V) -Baseline creatinine 0.7-1.0  -Likely due to volume depletion  -Continue IV fluids  -Discontinue lisinopril Normocytic anemia  -Unfortunately, the patient has not had any blood work noted for 3 years  -Hemoglobin September 2013 was 14 which appeared to be her previous baseline  -The patient may also have acute blood loss anemia secondary to her scalp hematoma -Check iron, B12, RBC folate -Fecal hemeoccult test  -Transfuse for hemoglobin less than 7  -Drop in hemoglobin partly due to dilution from intravenous fluids  History of hypertension  -Discontinue lisinopril, carvedilol  History of stroke -Hold Plavix for now given the patient's recent fall and scalp hematoma SSS/Complete HB -keep telemetry another 24 hours if possible  Family Communication:   Family updated at beside Disposition Plan:   SNF in 2 days if stable       Procedures/Studies: Ct Head Wo Contrast  04/13/2015   CLINICAL DATA:  Fall today, hematoma left posterior head, decreased level of consciousness  EXAM: CT HEAD WITHOUT CONTRAST  CT CERVICAL SPINE WITHOUT CONTRAST  TECHNIQUE: Multidetector CT imaging of the head and cervical spine was performed following the standard protocol without intravenous contrast. Multiplanar CT image reconstructions of the cervical spine were also generated.  COMPARISON:  03/23/2012  FINDINGS: CT HEAD FINDINGS  Posterior left scalp hematoma. No intracranial hemorrhage or extra-axial fluid. No evidence of vascular territory infarct. Severe atrophy and white matter disease. Probable lacunar infarct left thalamus and left basal ganglia, with a subacute to chronic appearance but not 1,013.  No skull fracture.  CT CERVICAL SPINE FINDINGS  Levoscoliosis. Severe degenerative disc disease throughout the cervical spine. Severe facet disease also present. No fracture identified. Mild anterior listhesis of C4 on C5. Mild  anterior  listhesis of C7 on T1. Severe degenerative disc disease at C5-6 and C6-7.  IMPRESSION: Severe chronic change in both the head and cervical spine with no acute traumatic injury other than left scalp hematoma.   Electronically Signed   By: Esperanza Heir M.D.   On: 04/13/2015 13:10   Ct Cervical Spine Wo Contrast  04/13/2015   CLINICAL DATA:  Fall today, hematoma left posterior head, decreased level of consciousness  EXAM: CT HEAD WITHOUT CONTRAST  CT CERVICAL SPINE WITHOUT CONTRAST  TECHNIQUE: Multidetector CT imaging of the head and cervical spine was performed following the standard protocol without intravenous contrast. Multiplanar CT image reconstructions of the cervical spine were also generated.  COMPARISON:  03/23/2012  FINDINGS: CT HEAD FINDINGS  Posterior left scalp hematoma. No intracranial hemorrhage or extra-axial fluid. No evidence of vascular territory infarct. Severe atrophy and white matter disease. Probable lacunar infarct left thalamus and left basal ganglia, with a subacute to chronic appearance but not 1,013.  No skull fracture.  CT CERVICAL SPINE FINDINGS  Levoscoliosis. Severe degenerative disc disease throughout the cervical spine. Severe facet disease also present. No fracture identified. Mild anterior listhesis of C4 on C5. Mild anterior listhesis of C7 on T1. Severe degenerative disc disease at C5-6 and C6-7.  IMPRESSION: Severe chronic change in both the head and cervical spine with no acute traumatic injury other than left scalp hematoma.   Electronically Signed   By: Esperanza Heir M.D.   On: 04/13/2015 13:10   Dg Chest Portable 1 View  04/13/2015   CLINICAL DATA:  Altered mental status, fall  EXAM: PORTABLE CHEST - 1 VIEW  COMPARISON:  12/15/2011  FINDINGS: Mild cardiac enlargement stable. Atherosclerotic aortic calcifications stable. Cardiac pacer stable. Bronchitic change stable. No consolidation or effusion.  IMPRESSION: Stable chronic findings with no acute process    Electronically Signed   By: Esperanza Heir M.D.   On: 04/13/2015 12:34         Subjective: Patient is presently confused. Denies any chest pain, short of breath, vomiting. There are no reports of uncontrolled pain, increased work of breathing, diarrhea.   Objective: Filed Vitals:   04/14/15 0100 04/14/15 0453 04/14/15 0721 04/14/15 1157  BP: 141/28 124/42 100/37 130/35  Pulse: 71  79   Temp:  97.9 F (36.6 C) 98.1 F (36.7 C) 98 F (36.7 C)  TempSrc:  Oral Oral Oral  Resp: 21 29 22 20   Height:      Weight:      SpO2: 90% 93% 100% 100%    Intake/Output Summary (Last 24 hours) at 04/14/15 1509 Last data filed at 04/14/15 1200  Gross per 24 hour  Intake 1561.25 ml  Output    350 ml  Net 1211.25 ml   Weight change:  Exam:   General:  Pt is alert, follows commands appropriately, not in acute distress  HEENT: No icterus, No thrush, No neck mass, [place]/AT  Cardiovascular: RRR, S1/S2, no rubs, no gallops  Respiratory: Bibasilar crackles. No wheezing. Good air movement.   Abdomen: Soft/+BS, non tender, non distended, no guarding; no hepatosplenomegaly   Extremities: No edema, No lymphangitis, No petechiae, No rashes, no synovitis; no cyanosis or clubbing  Data Reviewed: Basic Metabolic Panel:  Recent Labs Lab 04/13/15 1209 04/13/15 1250 04/13/15 1459 04/14/15 0558  NA 136 138 135 136  K 6.3* 5.6* 5.1 4.1  CL 106 109 106 109  CO2 21* 22 19* 21*  GLUCOSE 298* 278* 289* 152*  BUN  30* 30* 30* 27*  CREATININE 1.40* 1.29* 1.18* 1.28*  CALCIUM 8.1* 7.9* 7.6* 7.4*   Liver Function Tests:  Recent Labs Lab 04/13/15 1209 04/13/15 1250  AST 28 19  ALT 8* 9*  ALKPHOS 48 46  BILITOT 1.0 0.9  PROT 4.7* 5.1*  ALBUMIN 2.7* 2.5*   No results for input(s): LIPASE, AMYLASE in the last 168 hours. No results for input(s): AMMONIA in the last 168 hours. CBC:  Recent Labs Lab 04/13/15 1209 04/14/15 0558  WBC 18.8* 14.1*  HGB 10.6* 7.8*  HCT 32.9* 23.6*  MCV  97.6 97.9  PLT 395 293   Cardiac Enzymes:  Recent Labs Lab 04/13/15 1209  CKTOTAL 57   BNP: Invalid input(s): POCBNP CBG:  Recent Labs Lab 04/13/15 1201 04/13/15 2213 04/14/15 0717  GLUCAP 250* 179* 159*    Recent Results (from the past 240 hour(s))  Culture, blood (routine x 2)     Status: None (Preliminary result)   Collection Time: 04/13/15 11:58 AM  Result Value Ref Range Status   Specimen Description BLOOD LEFT ANTECUBITAL  Final   Special Requests BOTTLES DRAWN AEROBIC ONLY 4CC  Final   Culture NO GROWTH 1 DAY  Final   Report Status PENDING  Incomplete  Culture, blood (routine x 2)     Status: None (Preliminary result)   Collection Time: 04/13/15 12:50 PM  Result Value Ref Range Status   Specimen Description BLOOD LEFT WRIST  Final   Special Requests BOTTLES DRAWN AEROBIC ONLY 3CC  Final   Culture NO GROWTH 1 DAY  Final   Report Status PENDING  Incomplete  Gram stain     Status: None   Collection Time: 04/13/15  3:39 PM  Result Value Ref Range Status   Specimen Description URINE, CATHETERIZED  Final   Special Requests Normal  Final   Gram Stain   Final    CYTOSPIN SMEAR WBC PRESENT,BOTH PMN AND MONONUCLEAR NO ORGANISMS SEEN    Report Status 04/13/2015 FINAL  Final  MRSA PCR Screening     Status: Abnormal   Collection Time: 04/13/15  6:25 PM  Result Value Ref Range Status   MRSA by PCR POSITIVE (A) NEGATIVE Final    Comment:        The GeneXpert MRSA Assay (FDA approved for NASAL specimens only), is one component of a comprehensive MRSA colonization surveillance program. It is not intended to diagnose MRSA infection nor to guide or monitor treatment for MRSA infections. RESULT CALLED TO, READ BACK BY AND VERIFIED WITH: S.STOWE,RN AT 2124 BY L.PITT 04/13/15      Scheduled Meds: . Chlorhexidine Gluconate Cloth  6 each Topical Q0600  . mupirocin ointment  1 application Nasal BID  . piperacillin-tazobactam (ZOSYN)  IV  2.25 g Intravenous Q8H  .  sodium chloride  3 mL Intravenous Q12H  . [START ON 04/15/2015] vancomycin  500 mg Intravenous Q48H   Continuous Infusions: . sodium chloride 75 mL/hr at 04/13/15 1943     TAT, DAVID, DO  [facility] Pager [phone]  If 7PM-7AM, please contact night-coverage [email or web address] Password [email or web address] 04/14/2015, 3:09 PM   LOS: 1 day

## 2015-04-14 NOTE — Evaluation (Signed)
Physical Therapy Evaluation Patient Details Name: Lynn Weeks MRN: [id number] DOB: Jan 08, 1919 Today's Date: 04/14/2015   History of Present Illness  Lynn Weeks is a 81 y.o. female with PMH of DM, HPL, h/o CVA, SSS s/p PPM presented after a fall at home. Patient lives alone, last know well normal health yesterday evening. Per her granddaughter (who is a Engineer, civil (consulting)), patient tried to use restroom yesterday evening and fell in bathroom. Was found in bed this morning by nurse. EMS called. Patient is pleasantly demented. She denies acute chest pains, no SOB, no cough, no nausea, vomiting no abdominal pains, but reports some mild diarrhea. She also denies any focal weakness.Large occipital hematoma.    Clinical Impression  Pt admitted with above diagnosis. Pt currently with functional limitations due to the deficits listed below (see PT Problem List). Pt very lethargic today and needed cues and +2 assist to ambulate.  Granddaughter states that pt is much worse currently and agrees that pt may need SNF for therapy prior to d/c home.  Granddaughter prefers [facility] [place] or [facility] in [place].  Continue PT acutely.   Pt will benefit from skilled PT to increase their independence and safety with mobility to allow discharge to the venue listed below.      Follow Up Recommendations SNF;Supervision/Assistance - 24 hour    Equipment Recommendations  None recommended by PT    Recommendations for Other Services       Precautions / Restrictions Precautions Precautions: Fall Restrictions Weight Bearing Restrictions: No      Mobility  Bed Mobility Overal bed mobility: Needs Assistance;+2 for physical assistance Bed Mobility: Supine to Sit     Supine to sit: Max assist;Total assist;+2 for physical assistance     General bed mobility comments: Pt very sleepy and did not assist too much to come to EOB.  Pt didnot really want to get up and resisted initially but then did agree  to try.   Transfers Overall transfer level: Needs assistance Equipment used: 2 person hand held assist Transfers: Sit to/from Stand Sit to Stand: Max assist;Mod assist;+2 physical assistance         General transfer comment: Pt needed max encouragement and assist to stand.  Heavy posterior lean initially but did improve a litttle once up.  PT and tech giving assist with pt placing weight on bil UEs with bil UE support.    Ambulation/Gait Ambulation/Gait assistance: Mod assist;+2 physical assistance Ambulation Distance (Feet): 35 Feet Assistive device: 2 person hand held assist Gait Pattern/deviations: Step-to pattern;Decreased stride length;Decreased step length - right;Decreased step length - left;Shuffle;Staggering left;Staggering right;Drifts right/left;Trunk flexed;Narrow base of support;Leaning posteriorly   Gait velocity interpretation: Below normal speed for age/gender General Gait Details: Pt needed bil UE support to ambulate just outside door.  Pt kept eyes closed which granddaughter states she does at home.  Posterior lean throughout.  Pt needed mod assist to maneuver with assist to weight shift as well.  Pt taking short little steps as well.    Stairs            Wheelchair Mobility    Modified Rankin (Stroke Patients Only) Modified Rankin (Stroke Patients Only) Pre-Morbid Rankin Score: Moderate disability Modified Rankin: Severe disability     Balance Overall balance assessment: Needs assistance;History of Falls Sitting-balance support: Bilateral upper extremity supported;Feet supported Sitting balance-Leahy Scale: Poor Sitting balance - Comments: posterior lean Postural control: Posterior lean Standing balance support: Bilateral upper extremity supported;During functional activity Standing balance-Leahy  Scale: Poor Standing balance comment: Pt requires UE support with significant posterior lean.               High level balance activites: Direction  changes;Turns;Sudden stops High Level Balance Comments: Needed mod assist for high level activity             Pertinent Vitals/Pain Pain Assessment: Faces Faces Pain Scale: Hurts even more Pain Location: all over per pt Pain Descriptors / Indicators: Aching;Moaning Pain Intervention(s): Limited activity within patient's tolerance;Monitored during session;Repositioned  VSS    Home Living Family/patient expects to be discharged to:: Private residence Living Arrangements: Alone Available Help at Discharge: Family;Available PRN/intermittently Type of Home: House Home Access: Stairs to enter Entrance Stairs-Rails: Right Entrance Stairs-Number of Steps: 4 Home Layout: One level Home Equipment: Cane - quad;Cane - single point;Wheelchair - manual;Walker - standard;Tub bench;Bedside commode Additional Comments: Granddaughter is caregiver.  She gets pt on weekends and brings her to her house to give a good bath and sponge bathes sitting on toilet.  Granddaughter inititally stated she was going to try and line up 24 hour care however after PT discussed options and after pt required +2 assist family would like SNF with therapy and then home with caregivers.     Prior Function Level of Independence: Independent               Hand Dominance   Dominant Hand: Right    Extremity/Trunk Assessment   Upper Extremity Assessment: Defer to OT evaluation           Lower Extremity Assessment: Generalized weakness      Cervical / Trunk Assessment: Kyphotic  Communication   Communication: HOH  Cognition Arousal/Alertness: Lethargic Behavior During Therapy: Flat affect Overall Cognitive Status: History of cognitive impairments - at baseline       Memory: Decreased short-term memory              General Comments General comments (skin integrity, edema, etc.): Noted bright red blood on pad under pts head once she got OOB.  Pt has hematoma.  Nurse and MD aware.  Changed pad  prior to pt return to bed.      Exercises        Assessment/Plan    PT Assessment Patient needs continued PT services  PT Diagnosis Generalized weakness;Acute pain   PT Problem List Decreased activity tolerance;Decreased balance;Decreased mobility;Decreased knowledge of use of DME;Decreased safety awareness;Decreased knowledge of precautions;Pain  PT Treatment Interventions DME instruction;Gait training;Stair training;Functional mobility training;Therapeutic activities;Balance training;Therapeutic exercise;Patient/family education   PT Goals (Current goals can be found in the Care Plan section) Acute Rehab PT Goals Patient Stated Goal: to go home after rehab at SNF PT Goal Formulation: With patient/family Time For Goal Achievement: 04/28/15 Potential to Achieve Goals: Good    Frequency Min 3X/week   Barriers to discharge Decreased caregiver support (family may try to arrange 24 hour care )      Co-evaluation               End of Session Equipment Utilized During Treatment: Gait belt Activity Tolerance: Patient limited by fatigue Patient left: in bed;with call bell/phone within reach;with bed alarm set;with family/visitor present Nurse Communication: Mobility status         Time: 1610-9604 PT Time Calculation (min) (ACUTE ONLY): 24 min   Charges:   PT Evaluation $Initial PT Evaluation Tier I: 1 Procedure PT Treatments $Gait Training: 8-22 mins   PT G Codes:  Tawni Millers F 04/14/2015, 10:59 AM [facility] [phone] (pager)

## 2015-04-14 NOTE — Progress Notes (Signed)
Patient positive for MRSA in nares. Standing orders initiated. Patient placed in contact isolation

## 2015-04-15 ENCOUNTER — Inpatient Hospital Stay (HOSPITAL_COMMUNITY): Payer: Medicare Other

## 2015-04-15 DIAGNOSIS — Z5189 Encounter for other specified aftercare: Secondary | ICD-10-CM

## 2015-04-15 DIAGNOSIS — R0902 Hypoxemia: Secondary | ICD-10-CM

## 2015-04-15 DIAGNOSIS — D72829 Elevated white blood cell count, unspecified: Secondary | ICD-10-CM

## 2015-04-15 DIAGNOSIS — D62 Acute posthemorrhagic anemia: Secondary | ICD-10-CM

## 2015-04-15 LAB — CBC
HEMATOCRIT: 22.4 % — AB (ref 36.0–46.0)
HEMOGLOBIN: 7.3 g/dL — AB (ref 12.0–15.0)
MCH: 31.9 pg (ref 26.0–34.0)
MCHC: 32.6 g/dL (ref 30.0–36.0)
MCV: 97.8 fL (ref 78.0–100.0)
Platelets: 296 10*3/uL (ref 150–400)
RBC: 2.29 MIL/uL — AB (ref 3.87–5.11)
RDW: 14.8 % (ref 11.5–15.5)
WBC: 18 10*3/uL — AB (ref 4.0–10.5)

## 2015-04-15 LAB — BLOOD GAS, ARTERIAL
Acid-base deficit: 3.1 mmol/L — ABNORMAL HIGH (ref 0.0–2.0)
BICARBONATE: 20.4 meq/L (ref 20.0–24.0)
DRAWN BY: 10552
O2 CONTENT: 1 L/min
O2 Saturation: 94.5 %
PH ART: 7.436 (ref 7.350–7.450)
Patient temperature: 98.6
TCO2: 21.4 mmol/L (ref 0–100)
pCO2 arterial: 30.9 mmHg — ABNORMAL LOW (ref 35.0–45.0)
pO2, Arterial: 68.2 mmHg — ABNORMAL LOW (ref 80.0–100.0)

## 2015-04-15 LAB — BASIC METABOLIC PANEL
Anion gap: 6 (ref 5–15)
BUN: 20 mg/dL (ref 6–20)
CHLORIDE: 112 mmol/L — AB (ref 101–111)
CO2: 22 mmol/L (ref 22–32)
Calcium: 7.9 mg/dL — ABNORMAL LOW (ref 8.9–10.3)
Creatinine, Ser: 1.08 mg/dL — ABNORMAL HIGH (ref 0.44–1.00)
GFR calc non Af Amer: 43 mL/min — ABNORMAL LOW (ref >60)
GFR, EST AFRICAN AMERICAN: 49 mL/min — AB (ref >60)
Glucose, Bld: 138 mg/dL — ABNORMAL HIGH (ref 65–99)
POTASSIUM: 4.1 mmol/L (ref 3.5–5.1)
SODIUM: 140 mmol/L (ref 135–145)

## 2015-04-15 LAB — LACTIC ACID, PLASMA: LACTIC ACID, VENOUS: 1.6 mmol/L (ref 0.5–2.0)

## 2015-04-15 LAB — PREPARE RBC (CROSSMATCH)

## 2015-04-15 LAB — CORTISOL-AM, BLOOD: Cortisol - AM: 17 ug/dL (ref 6.7–22.6)

## 2015-04-15 MED ORDER — FUROSEMIDE 10 MG/ML IJ SOLN
40.0000 mg | Freq: Every day | INTRAMUSCULAR | Status: DC
  Administered 2015-04-15 – 2015-04-16 (×2): 40 mg via INTRAVENOUS
  Filled 2015-04-15 (×2): qty 4

## 2015-04-15 MED ORDER — SODIUM CHLORIDE 0.9 % IV SOLN
125.0000 mg | Freq: Once | INTRAVENOUS | Status: AC
  Administered 2015-04-15: 125 mg via INTRAVENOUS
  Filled 2015-04-15: qty 10

## 2015-04-15 MED ORDER — DOCUSATE SODIUM 100 MG PO CAPS
100.0000 mg | ORAL_CAPSULE | Freq: Two times a day (BID) | ORAL | Status: DC
  Administered 2015-04-15 – 2015-04-18 (×3): 100 mg via ORAL
  Filled 2015-04-15 (×4): qty 1

## 2015-04-15 MED ORDER — SODIUM CHLORIDE 0.9 % IV SOLN
Freq: Once | INTRAVENOUS | Status: AC
  Administered 2015-04-15: 11:00:00 via INTRAVENOUS

## 2015-04-15 MED ORDER — SENNA 8.6 MG PO TABS
2.0000 | ORAL_TABLET | Freq: Every day | ORAL | Status: DC
  Administered 2015-04-15 – 2015-04-18 (×2): 17.2 mg via ORAL
  Filled 2015-04-15 (×4): qty 2

## 2015-04-15 MED ORDER — FUROSEMIDE 10 MG/ML IJ SOLN
20.0000 mg | Freq: Once | INTRAMUSCULAR | Status: AC
  Administered 2015-04-15: 20 mg via INTRAVENOUS
  Filled 2015-04-15: qty 2

## 2015-04-15 NOTE — Progress Notes (Signed)
Updated pt's family on labs and new CXR and explained medical plan. CXR suggests pulm edema Start IV Lasix  daily Daily weights Lasix  between each unit PRBC  DTat

## 2015-04-15 NOTE — Progress Notes (Signed)
Patient's family refused bed alarm while they are in the room.

## 2015-04-15 NOTE — Progress Notes (Signed)
PROGRESS NOTE  Lynn Weeks UJW:[id number] DOB: March 09, 1919 DOA: 04/13/2015 PCP: Ailene Ravel, MD   Brief history 81 year old female with a history of abuse mellitus, hyperlipidemia, stroke, sick sinus syndrome, dementia, complete heart block presented after an unwitnessed mechanical fall. Due to the patient's acute encephalopathy. She is not able to provide any history. Apparently, it was surmised by the patient's granddaughter who is an Charity fundraiser that the patient had a fall in the bathroom on the evening of 04/12/2015. The patient was found lying in her bed awake with blood dripping from her posterior scalp that tracked from the bathroom on the morning of 04/13/2015 when she was found by her granddaughter. EMS was activated. In the emergency department, the patient was noted to be hypothermic. The patient was also noted to have lactic acidosis of 2.79. She was treated for presumptive sepsis and started on empiric antibiotics. Assessment/Plan: Hypothermia -Resolved -Check a.m. Cortisol -Continue antibiotics pending culture data -Chest x-ray negative for acute infiltrates or pulmonary edema -Urinalysis negative for pyuria -EKG is without any concerning ischemic changes  -Check TSH --1.706 Acute encephalopathy  -Multifactorial including ABLA, acute on chronic renal failure, possible infectious process, hypotension, metabolic arrangements including hyperkalemia, and day-not reversion from hospitalization in the setting of underlying dementia  -Repeat CT brain--neg subdural hematoma  -Check ammonia--<9 -TSH--1.706 -Urinalysis negative for pyuria Diabetes mellitus type 2, controlled -04/13/2015 hemoglobin A1c 6.8  -Discontinue sliding scale insulin  -Discontinue CBG checks  -Allow for liberal glycemic control  Lactic acidosis -Likely due to volume depletion as well as metformin use in the setting of baseline creatinine clearance approximately 30 -will not restart  metformin after discharge Acute on chronic renal failure (CKD stage IIII-V) -Baseline creatinine 0.7-1.0  -Likely due to volume depletion  -Continue IV fluids  -Discontinue lisinopril Normocytic anemia /Acute Blood Loss Anemia -Unfortunately, the patient has not had any blood work noted for 3 years  -Hemoglobin September 2013 was 14 which appeared to be her previous baseline  -The patient may also have acute blood loss anemia secondary to her scalp hematoma -iron sat 8%, ferritin 28--give dose of nulecit and eventually start po iron -B12--638 -RBC folate--pending -Fecal hemeoccult test  -Transfuse 2 units PRBC -Drop in hemoglobin partly due to dilution from intravenous fluids  Leukocytosis -WBC increased -Remains afebrile and hemodynamically stable -Continue intravenous antibiotics pending culture data -Granddaughter states that patient has had chronic the baby's elevation, 13-14 range -Repeat chest x-ray given abnormal physical exam findings History of hypertension  -Discontinue lisinopril, carvedilol  History of stroke -Hold Plavix for now given the patient's recent fall and scalp hematoma SSS/Complete HB -keep telemetry another 24 hours if possible  Family Communication: Family updated at beside Disposition Plan: SNF in 1-2 days if stable   Procedures/Studies: Ct Head Wo Contrast  04/14/2015   CLINICAL DATA:  Acute encephalopathy.  The patient was found lying in her bed awake with blood dripping from her posterior scalp that tracked from the bathroom on the morning of 04/13/2015 when she was found by her granddaughter. EMS was activated. In the emergency department, the patient was noted to be hypothermic. The patient was also noted to have lactic acidosis of 2.79. She was treated for presumptive sepsis and started on empiric antibiotics. F/u yesterdays scan today  EXAM: CT HEAD WITHOUT CONTRAST  TECHNIQUE: Contiguous axial images were obtained from the base of the  skull through the vertex without intravenous contrast.  COMPARISON:  04/13/2015  FINDINGS: Ventricles are normal configuration. There is ventricular and sulcal enlargement reflecting moderate atrophy. No hydrocephalus.  There are no parenchymal masses or mass effect. There is no evidence of a recent cortical infarct. There is an old infarct involving the inferior right cerebellum. Small lacune infarcts noted in the left base a ganglia, left thalamus and right caudate nucleus head. Patchy white matter hypoattenuation is noted in the periventricular white matter consistent with mild to moderate chronic microvascular ischemic change.  There are no extra-axial masses or abnormal fluid collections.  There is no intracranial hemorrhage.  Visualized sinuses are clear other than minor posterior right maxillary sinus mucosal thickening.  IMPRESSION: 1. No acute intracranial abnormalities. No change from the previous day's study. 2. Atrophy, small old lacune infarcts and mild to moderate chronic microvascular ischemic change.   Electronically Signed   By: Amie Portland M.D.   On: 04/14/2015 18:49   Ct Head Wo Contrast  04/13/2015   CLINICAL DATA:  Fall today, hematoma left posterior head, decreased level of consciousness  EXAM: CT HEAD WITHOUT CONTRAST  CT CERVICAL SPINE WITHOUT CONTRAST  TECHNIQUE: Multidetector CT imaging of the head and cervical spine was performed following the standard protocol without intravenous contrast. Multiplanar CT image reconstructions of the cervical spine were also generated.  COMPARISON:  03/23/2012  FINDINGS: CT HEAD FINDINGS  Posterior left scalp hematoma. No intracranial hemorrhage or extra-axial fluid. No evidence of vascular territory infarct. Severe atrophy and white matter disease. Probable lacunar infarct left thalamus and left basal ganglia, with a subacute to chronic appearance but not 1,013.  No skull fracture.  CT CERVICAL SPINE FINDINGS  Levoscoliosis. Severe degenerative disc  disease throughout the cervical spine. Severe facet disease also present. No fracture identified. Mild anterior listhesis of C4 on C5. Mild anterior listhesis of C7 on T1. Severe degenerative disc disease at C5-6 and C6-7.  IMPRESSION: Severe chronic change in both the head and cervical spine with no acute traumatic injury other than left scalp hematoma.   Electronically Signed   By: Esperanza Heir M.D.   On: 04/13/2015 13:10   Ct Cervical Spine Wo Contrast  04/13/2015   CLINICAL DATA:  Fall today, hematoma left posterior head, decreased level of consciousness  EXAM: CT HEAD WITHOUT CONTRAST  CT CERVICAL SPINE WITHOUT CONTRAST  TECHNIQUE: Multidetector CT imaging of the head and cervical spine was performed following the standard protocol without intravenous contrast. Multiplanar CT image reconstructions of the cervical spine were also generated.  COMPARISON:  03/23/2012  FINDINGS: CT HEAD FINDINGS  Posterior left scalp hematoma. No intracranial hemorrhage or extra-axial fluid. No evidence of vascular territory infarct. Severe atrophy and white matter disease. Probable lacunar infarct left thalamus and left basal ganglia, with a subacute to chronic appearance but not 1,013.  No skull fracture.  CT CERVICAL SPINE FINDINGS  Levoscoliosis. Severe degenerative disc disease throughout the cervical spine. Severe facet disease also present. No fracture identified. Mild anterior listhesis of C4 on C5. Mild anterior listhesis of C7 on T1. Severe degenerative disc disease at C5-6 and C6-7.  IMPRESSION: Severe chronic change in both the head and cervical spine with no acute traumatic injury other than left scalp hematoma.   Electronically Signed   By: Esperanza Heir M.D.   On: 04/13/2015 13:10   Dg Chest Portable 1 View  04/13/2015   CLINICAL DATA:  Altered mental status, fall  EXAM: PORTABLE CHEST - 1 VIEW  COMPARISON:  12/15/2011  FINDINGS: Mild cardiac enlargement stable. Atherosclerotic aortic  calcifications  stable. Cardiac pacer stable. Bronchitic change stable. No consolidation or effusion.  IMPRESSION: Stable chronic findings with no acute process   Electronically Signed   By: Esperanza Heir M.D.   On: 04/13/2015 12:34         Subjective: Patient is pleasantly confused. No reports of vomiting, respiratory distress, increased work of breathing, rectal pain, diarrhea. Patient denies any pain or headache. No fevers or chills.  Objective: Filed Vitals:   04/14/15 1839 04/14/15 2018 04/15/15 0456 04/15/15 0500  BP: 132/37 142/51 133/57   Pulse:  73 84   Temp: 98.1 F (36.7 C) 97.6 F (36.4 C) 97.7 F (36.5 C)   TempSrc: Oral Oral Oral   Resp: 22     Height:    (1.397 m)   Weight:   45.178 kg (99 lb 9.6 oz)   SpO2: 98% 97% 92% 94%    Intake/Output Summary (Last 24 hours) at 04/15/15 1003 Last data filed at 04/15/15 0800  Gross per 24 hour  Intake   1085 ml  Output    950 ml  Net    135 ml   Weight change: 0.278 kg (9.8 oz) Exam:   General:  Pt is alert, follows commands appropriately, not in acute distress  HEENT: No icterus, No thrush, No neck mass, [place]/AT  Cardiovascular: RRR, S1/S2, no rubs, no gallops  Respiratory: Bibasilar crackles, right greater than left. No wheezes  Abdomen: Soft/+BS, non tender, non distended, no guarding no hepatosplenomegaly;  Extremities: No edema, No lymphangitis, No petechiae, No rashes, no synovitis; no cyanosis or clubbing  Data Reviewed: Basic Metabolic Panel:  Recent Labs Lab 04/13/15 1209 04/13/15 1250 04/13/15 1459 04/14/15 0558 04/15/15 0315  NA 136 138 135 136 140  K 6.3* 5.6* 5.1 4.1 4.1  CL 106 109 106 109 112*  CO2 21* 22 19* 21* 22  GLUCOSE 298* 278* 289* 152* 138*  BUN 30* 30* 30* 27* 20  CREATININE 1.40* 1.29* 1.18* 1.28* 1.08*  CALCIUM 8.1* 7.9* 7.6* 7.4* 7.9*   Liver Function Tests:  Recent Labs Lab 04/13/15 1209 04/13/15 1250  AST 28 19  ALT 8* 9*  ALKPHOS 48 46  BILITOT 1.0 0.9  PROT 4.7*  5.1*  ALBUMIN 2.7* 2.5*   No results for input(s): LIPASE, AMYLASE in the last 168 hours.  Recent Labs Lab 04/14/15 1710  AMMONIA <9*   CBC:  Recent Labs Lab 04/13/15 1209 04/14/15 0558 04/15/15 0315  WBC 18.8* 14.1* 18.0*  HGB 10.6* 7.8* 7.3*  HCT 32.9* 23.6* 22.4*  MCV 97.6 97.9 97.8  PLT 395 293 296   Cardiac Enzymes:  Recent Labs Lab 04/13/15 1209  CKTOTAL 57   BNP: Invalid input(s): POCBNP CBG:  Recent Labs Lab 04/13/15 1201 04/13/15 2213 04/14/15 0717  GLUCAP 250* 179* 159*    Recent Results (from the past 240 hour(s))  Culture, blood (routine x 2)     Status: None (Preliminary result)   Collection Time: 04/13/15 11:58 AM  Result Value Ref Range Status   Specimen Description BLOOD LEFT ANTECUBITAL  Final   Special Requests BOTTLES DRAWN AEROBIC ONLY 4CC  Final   Culture NO GROWTH 1 DAY  Final   Report Status PENDING  Incomplete  Culture, blood (routine x 2)     Status: None (Preliminary result)   Collection Time: 04/13/15 12:50 PM  Result Value Ref Range Status   Specimen Description BLOOD LEFT WRIST  Final   Special Requests BOTTLES DRAWN AEROBIC ONLY 3CC  Final   Culture NO GROWTH 1 DAY  Final   Report Status PENDING  Incomplete  Gram stain     Status: None   Collection Time: 04/13/15  3:39 PM  Result Value Ref Range Status   Specimen Description URINE, CATHETERIZED  Final   Special Requests Normal  Final   Gram Stain   Final    CYTOSPIN SMEAR WBC PRESENT,BOTH PMN AND MONONUCLEAR NO ORGANISMS SEEN    Report Status 04/13/2015 FINAL  Final  MRSA PCR Screening     Status: Abnormal   Collection Time: 04/13/15  6:25 PM  Result Value Ref Range Status   MRSA by PCR POSITIVE (A) NEGATIVE Final    Comment:        The GeneXpert MRSA Assay (FDA approved for NASAL specimens only), is one component of a comprehensive MRSA colonization surveillance program. It is not intended to diagnose MRSA infection nor to guide or monitor treatment  for MRSA infections. RESULT CALLED TO, READ BACK BY AND VERIFIED WITH: S.STOWE,RN AT 2124 BY L.PITT 04/13/15      Scheduled Meds: . sodium chloride   Intravenous Once  . Chlorhexidine Gluconate Cloth  6 each Topical Q0600  . docusate sodium  100 mg Oral BID  . ferric gluconate (FERRLECIT/NULECIT) IV  125 mg Intravenous Once  . mupirocin ointment  1 application Nasal BID  . piperacillin-tazobactam (ZOSYN)  IV  2.25 g Intravenous Q8H  . senna  2 tablet Oral Daily  . sodium chloride  3 mL Intravenous Q12H  . vancomycin  500 mg Intravenous Q48H   Continuous Infusions:    TAT, DAVID, DO  [facility] Pager [phone]  If 7PM-7AM, please contact night-coverage [email or web address] Password [email or web address] 04/15/2015, 10:03 AM   LOS: 2 days

## 2015-04-15 NOTE — Clinical Social Work Placement (Signed)
   CLINICAL SOCIAL WORK PLACEMENT  NOTE  Date:  04/15/2015  Patient Details  Name: Lynn Weeks MRN: [id number] Date of Birth: 10-Nov-1918  Clinical Social Work is seeking post-discharge placement for this patient at the Skilled  Nursing Facility level of care (*CSW will initial, date and re-position this form in  chart as items are completed):  Yes   Patient/family provided with [facility] Clinical Social Work Department's list of facilities offering this level of care within the geographic area requested by the patient (or if unable, by the patient's family).  Yes   Patient/family informed of their freedom to choose among providers that offer the needed level of care, that participate in Medicare, Medicaid or managed care program needed by the patient, have an available bed and are willing to accept the patient.  Yes   Patient/family informed of [facility]'s ownership interest in [facility] and [facility], as well as of the fact that they are under no obligation to receive care at these facilities.  PASRR submitted to EDS on       PASRR number received on       Existing PASRR number confirmed on 04/15/15     FL2 transmitted to all facilities in geographic area requested by pt/family on 04/15/15     FL2 transmitted to all facilities within larger geographic area on       Patient informed that his/her managed care company has contracts with or will negotiate with certain facilities, including the following:            Patient/family informed of bed offers received.  Patient chooses bed at       Physician recommends and patient chooses bed at      Patient to be transferred to   on  .  Patient to be transferred to facility by       Patient family notified on   of transfer.  Name of family member notified:        PHYSICIAN Please sign FL2, Please sign DNR     Additional Comment:    _______________________________________________ Sharol Harness,  LCSW Weekend CSW [phone]

## 2015-04-15 NOTE — Clinical Social Work Note (Signed)
Clinical Social Work Assessment  Patient Details  Name: Lynn Weeks MRN: [id number] Date of Birth: [birth year]  Date of referral:  04/15/15               Reason for consult:  Facility Placement                Permission sought to share information with:  Facility Industrial/product designer granted to share information::  Yes, Verbal Permission Granted  Name::        Agency::  SNFs for referral purposes  Relationship::     Contact Information:     Housing/Transportation Living arrangements for the past 2 months:  Single Family Home Source of Information:  Adult Children, Other (Comment Required) (Granddaughter) Patient Interpreter Needed:  None Criminal Activity/Legal Involvement Pertinent to Current Situation/Hospitalization:  No - Comment as needed Significant Relationships:  Adult Children, Other Family Members Lives with:  Self Do you feel safe going back to the place where you live?  No Need for family participation in patient care:  Yes (Comment)  Care giving concerns:  Pt family requesting SNF at [facility]   Social Worker assessment / plan:  CSW visited pt room and spoke with pt son and granddaughter at bedside. They informed CSW that prior to admission pt was living home alone. Family was checking in on pt 3-4 time a day however. Pt family feels that at this time pt cannot return home. They would like to pursue SNF for ST rehab. They are unsure though if this will switch to long term care if pt is unable to progress because of mental status. Pt granddaughter works in Teacher, music and is familiar with facilities. She is agreeable to referral being sent to select facilities and family has a strong preference for [facility]. CSW agreeable to calling this facility tomorrow morning when admission staff is in the building.   Employment status:  Retired Health and safety inspector:  [employer] PT Recommendations:  Skilled Nursing Facility Information / Referral to community  resources:  Skilled Nursing Facility  Patient/Family's Response to care:  Pt family in agreement with plan of care  Patient/Family's Understanding of and Emotional Response to Diagnosis, Current Treatment, and Prognosis:  Pt family has a strong understanding of pt condition and treatment. Family emotional response appropriate. Family is concerned for pt ability to recover fully from this hospitalization .  Emotional Assessment Appearance:  Well-Groomed Attitude/Demeanor/Rapport:  Unable to Assess Affect (typically observed):  Unable to Assess Orientation:  Oriented to Self Alcohol / Substance use:  Not Applicable Psych involvement (Current and /or in the community):  No (Comment)  Discharge Needs  Concerns to be addressed:  Discharge Planning Concerns Readmission within the last 30 days:  No Current discharge risk:  Dependent with Mobility, Cognitively Impaired Barriers to Discharge:  Continued Medical Work up   [employer], LCSW Weekend CSW [phone]

## 2015-04-16 ENCOUNTER — Inpatient Hospital Stay (HOSPITAL_COMMUNITY): Payer: Medicare Other

## 2015-04-16 DIAGNOSIS — I5031 Acute diastolic (congestive) heart failure: Secondary | ICD-10-CM

## 2015-04-16 DIAGNOSIS — I509 Heart failure, unspecified: Secondary | ICD-10-CM

## 2015-04-16 LAB — BASIC METABOLIC PANEL
ANION GAP: 10 (ref 5–15)
BUN: 13 mg/dL (ref 6–20)
CALCIUM: 7.8 mg/dL — AB (ref 8.9–10.3)
CHLORIDE: 98 mmol/L — AB (ref 101–111)
CO2: 24 mmol/L (ref 22–32)
CREATININE: 1.13 mg/dL — AB (ref 0.44–1.00)
GFR calc non Af Amer: 40 mL/min — ABNORMAL LOW (ref >60)
GFR, EST AFRICAN AMERICAN: 47 mL/min — AB (ref >60)
Glucose, Bld: 177 mg/dL — ABNORMAL HIGH (ref 65–99)
Potassium: 3.2 mmol/L — ABNORMAL LOW (ref 3.5–5.1)
SODIUM: 132 mmol/L — AB (ref 135–145)

## 2015-04-16 LAB — CBC
HEMATOCRIT: 40.7 % (ref 36.0–46.0)
HEMOGLOBIN: 13.6 g/dL (ref 12.0–15.0)
MCH: 30 pg (ref 26.0–34.0)
MCHC: 33.4 g/dL (ref 30.0–36.0)
MCV: 89.8 fL (ref 78.0–100.0)
Platelets: 198 10*3/uL (ref 150–400)
RBC: 4.53 MIL/uL (ref 3.87–5.11)
RDW: 17.4 % — ABNORMAL HIGH (ref 11.5–15.5)
WBC: 12.3 10*3/uL — AB (ref 4.0–10.5)

## 2015-04-16 LAB — TYPE AND SCREEN
ABO/RH(D): AB NEG
Antibody Screen: NEGATIVE
UNIT DIVISION: 0
Unit division: 0

## 2015-04-16 LAB — BRAIN NATRIURETIC PEPTIDE: B Natriuretic Peptide: 1084.1 pg/mL — ABNORMAL HIGH (ref 0.0–100.0)

## 2015-04-16 LAB — FOLATE RBC
FOLATE, HEMOLYSATE: 389.3 ng/mL
Folate, RBC: 1762 ng/mL (ref >498)
Hematocrit: 22.1 % — ABNORMAL LOW (ref 34.0–46.6)

## 2015-04-16 LAB — PROCALCITONIN: Procalcitonin: 0.23 ng/mL

## 2015-04-16 MED ORDER — AMOXICILLIN-POT CLAVULANATE 500-125 MG PO TABS
1.0000 | ORAL_TABLET | Freq: Two times a day (BID) | ORAL | Status: DC
  Filled 2015-04-16 (×2): qty 1

## 2015-04-16 MED ORDER — LORAZEPAM 2 MG/ML IJ SOLN
0.2500 mg | Freq: Once | INTRAMUSCULAR | Status: AC
  Administered 2015-04-16: 0.25 mg via INTRAVENOUS
  Filled 2015-04-16: qty 1

## 2015-04-16 MED ORDER — QUETIAPINE FUMARATE 25 MG PO TABS
25.0000 mg | ORAL_TABLET | Freq: Every day | ORAL | Status: DC
  Administered 2015-04-16 – 2015-04-17 (×2): 25 mg via ORAL
  Filled 2015-04-16 (×2): qty 1

## 2015-04-16 NOTE — Progress Notes (Signed)
CSW Proofreader) spoke with pt family and provided bed offers. They would like to accept bed offer at [facility]. Facility aware and can accept pt is medically stable for dc tomorrow.  Poonum Ambelal, LCSW [phone]

## 2015-04-16 NOTE — Progress Notes (Signed)
ANTIBIOTIC CONSULT NOTE - INITIAL  Pharmacy Consult for vanc/Zosyn Indication: rule out sepsis  Allergies  Allergen Reactions  . Ciprofloxacin Nausea And Vomiting  . Crestor [Rosuvastatin Calcium] Swelling  . Escitalopram Oxalate Other (See Comments)    Yawning and insomnia   . Lipitor [Atorvastatin Calcium] Other (See Comments)    myalgia  . Metronidazole Nausea And Vomiting  . Sulfa Antibiotics Swelling    Patient Measurements: Height:  (139.7 cm) Weight: 103 lb 3.2 oz (46.811 kg) IBW/kg (Calculated) : 34   Vital Signs: Temp: 99.3 F (37.4 C) (09/19 0855) Temp Source: Axillary (09/19 0855) BP: 143/55 mmHg (09/19 0855) Pulse Rate: 69 (09/19 0855) Intake/Output from previous day: 09/18 0701 - 09/19 0700 In: 1320 [P.O.:100; I.V.:550; Blood:670] Out: 2700 [Urine:2700] Intake/Output from this shift:    Labs:  Recent Labs  04/14/15 0558 04/15/15 0315 04/16/15 0258  WBC 14.1* 18.0* 12.3*  HGB 7.8* 7.3* 13.6  PLT 293 296 198  CREATININE 1.28* 1.08* 1.13*   Estimated Creatinine Clearance: 18.8 mL/min (by C-G formula based on Cr of 1.13). No results for input(s): VANCOTROUGH, VANCOPEAK, VANCORANDOM, GENTTROUGH, GENTPEAK, GENTRANDOM, TOBRATROUGH, TOBRAPEAK, TOBRARND, AMIKACINPEAK, AMIKACINTROU, AMIKACIN in the last 72 hours.   Microbiology: Recent Results (from the past 720 hour(s))  Culture, blood (routine x 2)     Status: None (Preliminary result)   Collection Time: 04/13/15 11:58 AM  Result Value Ref Range Status   Specimen Description BLOOD LEFT ANTECUBITAL  Final   Special Requests BOTTLES DRAWN AEROBIC ONLY 4CC  Final   Culture NO GROWTH 2 DAYS  Final   Report Status PENDING  Incomplete  Culture, blood (routine x 2)     Status: None (Preliminary result)   Collection Time: 04/13/15 12:50 PM  Result Value Ref Range Status   Specimen Description BLOOD LEFT WRIST  Final   Special Requests BOTTLES DRAWN AEROBIC ONLY 3CC  Final   Culture NO GROWTH 2 DAYS   Final   Report Status PENDING  Incomplete  Gram stain     Status: None   Collection Time: 04/13/15  3:39 PM  Result Value Ref Range Status   Specimen Description URINE, CATHETERIZED  Final   Special Requests Normal  Final   Gram Stain   Final    CYTOSPIN SMEAR WBC PRESENT,BOTH PMN AND MONONUCLEAR NO ORGANISMS SEEN    Report Status 04/13/2015 FINAL  Final  MRSA PCR Screening     Status: Abnormal   Collection Time: 04/13/15  6:25 PM  Result Value Ref Range Status   MRSA by PCR POSITIVE (A) NEGATIVE Final    Comment:        The GeneXpert MRSA Assay (FDA approved for NASAL specimens only), is one component of a comprehensive MRSA colonization surveillance program. It is not intended to diagnose MRSA infection nor to guide or monitor treatment for MRSA infections. RESULT CALLED TO, READ BACK BY AND VERIFIED WITH: S.STOWE,RN AT 2124 BY L.PITT 04/13/15     Medical History: Past Medical History  Diagnosis Date  . Complete heart block 05/08/2011  . CAD (coronary artery disease) 05/08/2011  . Hypertensive heart disease without congestive heart failure 05/08/2011  . Hyponatremia 05/08/2011  . Hyperlipemia 05/08/2011  . Type 2 diabetes 05/08/2011  . Murmur, cardiac 05/08/2011  . Osteoarthritis   . Anxiety   . Stroke     Assessment: 81 yo female found in bed with puddle of blood, has hematoma to posterior head, bleeding controlled. Plavix pta. Pharmacy consulted to dose vanc/zosyn  for sepsis. Low temps improving, WBC 18.8 on admit. WBC now improved to 12.3. SCr 1.4 on admit, now 1.13 (baseline 0.8-0.9), CrCl~19. Wt~45kg. 1x doses given in ED.  9/16 vanc>> 9/16 zosyn>>  9/16 BCx2>> NGTD 9/16 MRSA PCR >> positive   Goal of Therapy:  Vancomycin trough level 15-20 mcg/ml  Plan:  - Continue Vanc  IV q48h - Continue Zosyn 2.25 IV q8h - Monitor clinical progress, c/s, renal function, abx plan - VT@SS  as indicated - has only receive 2 doses thus far due to poor renal  function    Juanita Craver, PharmD, BCPS Clinical Pharmacist [phone]

## 2015-04-16 NOTE — Progress Notes (Addendum)
PROGRESS NOTE  Lynn Weeks UEA:[id number] DOB: 1918-08-28 DOA: 04/13/2015 PCP: Ailene Ravel, MD  Brief history 81 year old female with a history of abuse mellitus, hyperlipidemia, stroke, sick sinus syndrome, dementia, complete heart block presented after an unwitnessed mechanical fall. Due to the patient's acute encephalopathy. She is not able to provide any history. Apparently, it was surmised by the patient's granddaughter who is an Charity fundraiser that the patient had a fall in the bathroom on the evening of 04/12/2015. The patient was found lying in her bed awake with blood dripping from her posterior scalp that tracked from the bathroom on the morning of 04/13/2015 when she was found by her granddaughter. EMS was activated. In the emergency department, the patient was noted to be hypothermic. The patient was also noted to have lactic acidosis of 2.79. She was treated for presumptive sepsis and started on empiric antibiotics  An intravenous fluids. Unfortunately, the patient developed volume overload and had to be started on IV furosemide. Assessment/Plan: Hypothermia -Resolved -a.m. Cortisol--17.0 -Discontinue IV abx after today's doses -PCT--0.23 -blood cultures remain negative -Chest x-ray negative for acute infiltrates or pulmonary edema -Urinalysis negative for pyuria -EKG is without any concerning ischemic changes  -Check TSH --1.706 Acute respiratory failure with hypoxia -Secondary to acute diastolic CHF -Neg 2700 with IV Lasix -now stable on RA Acute encephalopathy  -Multifactorial including ABLA, acute on chronic renal failure, possible infectious process, hypotension, fluid overload, metabolic arrangements including hyperkalemia, and day-nite reversion from hospitalization in the setting of underlying dementia  -Repeat CT brain--neg subdural hematoma  -Check ammonia--<9 -TSH--1.706 -Urinalysis negative for pyuria -ABG--7.46/30/68/21 on 1 L -03/1915--mental  status back to baseline -seroquel q hs per family request Acute Diastolic CHF -04/16/2015 echo EF 55-60%, grade 1 diastolic dysfunction,no WMA, mild AS, AI, mod TR -NEG 2700 cc with IV Lasix -d/c lasix after today's dose Diabetes mellitus type 2, controlled -04/13/2015 hemoglobin A1c 6.8  -Discontinue sliding scale insulin  -Discontinue CBG checks  -Allow for liberal glycemic control  Lactic acidosis -Likely due to volume depletion as well as metformin use in the setting of baseline creatinine clearance approximately 30 -will not restart metformin after discharge -now resolved Acute on chronic renal failure (CKD stage IIII-V) -Baseline creatinine 0.7-1.0  -Likely due to volume depletion   -Discontinue lisinopril--will not restart after d/c Normocytic anemia /Acute Blood Loss Anemia -Unfortunately, the patient has not had any blood work noted for 3 years  -Hemoglobin September 2013 was 14 which appeared to be her previous baseline  -The patient may also have acute blood loss anemia secondary to her scalp hematoma -iron sat 8%, ferritin 28--give dose of nulecit and eventually start po iron -B12--638 -RBC folate--1762 -Fecal hemeoccult test  -04/15/15--Transfused 2 units PRBC -Drop in hemoglobin partly due to dilution from intravenous fluids  Leukocytosis -WBC increased from blood transfusion -Remains afebrile and hemodynamically stable -d/c IV abx after today's doses -Granddaughter states that patient has had chronic the baby's elevation, 13-14 range History of hypertension  -Discontinue lisinopril, carvedilol  History of stroke -Hold Plavix for now given the patient's recent fall and scalp hematoma SSS/Complete HB -keep telemetry another 24 hours if possible  Family Communication: Family updated at beside Disposition Plan: SNF 04/17/15 if stable       Procedures/Studies: Ct Head Wo Contrast  04/14/2015   CLINICAL DATA:  Acute encephalopathy.  The  patient was found lying in her bed awake with blood dripping from her posterior scalp that  tracked from the bathroom on the morning of 04/13/2015 when she was found by her granddaughter. EMS was activated. In the emergency department, the patient was noted to be hypothermic. The patient was also noted to have lactic acidosis of 2.79. She was treated for presumptive sepsis and started on empiric antibiotics. F/u yesterdays scan today  EXAM: CT HEAD WITHOUT CONTRAST  TECHNIQUE: Contiguous axial images were obtained from the base of the skull through the vertex without intravenous contrast.  COMPARISON:  04/13/2015  FINDINGS: Ventricles are normal configuration. There is ventricular and sulcal enlargement reflecting moderate atrophy. No hydrocephalus.  There are no parenchymal masses or mass effect. There is no evidence of a recent cortical infarct. There is an old infarct involving the inferior right cerebellum. Small lacune infarcts noted in the left base a ganglia, left thalamus and right caudate nucleus head. Patchy white matter hypoattenuation is noted in the periventricular white matter consistent with mild to moderate chronic microvascular ischemic change.  There are no extra-axial masses or abnormal fluid collections.  There is no intracranial hemorrhage.  Visualized sinuses are clear other than minor posterior right maxillary sinus mucosal thickening.  IMPRESSION: 1. No acute intracranial abnormalities. No change from the previous day's study. 2. Atrophy, small old lacune infarcts and mild to moderate chronic microvascular ischemic change.   Electronically Signed   By: Amie Portland M.D.   On: 04/14/2015 18:49   Ct Head Wo Contrast  04/13/2015   CLINICAL DATA:  Fall today, hematoma left posterior head, decreased level of consciousness  EXAM: CT HEAD WITHOUT CONTRAST  CT CERVICAL SPINE WITHOUT CONTRAST  TECHNIQUE: Multidetector CT imaging of the head and cervical spine was performed following the standard  protocol without intravenous contrast. Multiplanar CT image reconstructions of the cervical spine were also generated.  COMPARISON:  03/23/2012  FINDINGS: CT HEAD FINDINGS  Posterior left scalp hematoma. No intracranial hemorrhage or extra-axial fluid. No evidence of vascular territory infarct. Severe atrophy and white matter disease. Probable lacunar infarct left thalamus and left basal ganglia, with a subacute to chronic appearance but not 1,013.  No skull fracture.  CT CERVICAL SPINE FINDINGS  Levoscoliosis. Severe degenerative disc disease throughout the cervical spine. Severe facet disease also present. No fracture identified. Mild anterior listhesis of C4 on C5. Mild anterior listhesis of C7 on T1. Severe degenerative disc disease at C5-6 and C6-7.  IMPRESSION: Severe chronic change in both the head and cervical spine with no acute traumatic injury other than left scalp hematoma.   Electronically Signed   By: Esperanza Heir M.D.   On: 04/13/2015 13:10   Ct Cervical Spine Wo Contrast  04/13/2015   CLINICAL DATA:  Fall today, hematoma left posterior head, decreased level of consciousness  EXAM: CT HEAD WITHOUT CONTRAST  CT CERVICAL SPINE WITHOUT CONTRAST  TECHNIQUE: Multidetector CT imaging of the head and cervical spine was performed following the standard protocol without intravenous contrast. Multiplanar CT image reconstructions of the cervical spine were also generated.  COMPARISON:  03/23/2012  FINDINGS: CT HEAD FINDINGS  Posterior left scalp hematoma. No intracranial hemorrhage or extra-axial fluid. No evidence of vascular territory infarct. Severe atrophy and white matter disease. Probable lacunar infarct left thalamus and left basal ganglia, with a subacute to chronic appearance but not 1,013.  No skull fracture.  CT CERVICAL SPINE FINDINGS  Levoscoliosis. Severe degenerative disc disease throughout the cervical spine. Severe facet disease also present. No fracture identified. Mild anterior listhesis  of C4 on C5. Mild anterior listhesis  of C7 on T1. Severe degenerative disc disease at C5-6 and C6-7.  IMPRESSION: Severe chronic change in both the head and cervical spine with no acute traumatic injury other than left scalp hematoma.   Electronically Signed   By: Esperanza Heir M.D.   On: 04/13/2015 13:10   Dg Chest Port 1 View  04/15/2015   CLINICAL DATA:  81 year old female with a history of wheezing and shortness of breath. Fall several days prior. Fever.  EXAM: PORTABLE CHEST - 1 VIEW  COMPARISON:  04/13/2015  FINDINGS: Cardiomediastinal silhouette likely unchanged, though partially obscured by overlying lung and pleural disease.  Interstitial opacities with peribronchial cuffing and interlobular septal thickening, significantly increased from the recent comparison.  Dense opacities the bilateral lung bases with obscuration the hemidiaphragms and the heart borders.  Cardiac pacing device on left chest wall unchanged with 2 leads in place.  Osteopenia.  Surgical changes of right shoulder arthroplasty.  Apices of the lungs not well evaluated secondary to overlying soft tissues of the chin.  IMPRESSION: Interval development of interstitial opacities and interlobular septal thickening extending from the bilateral hila, most concerning for edema and bilateral pleural effusions given the short interval change, however, infection difficult to exclude.  Signed,  Yvone Neu. Loreta Ave, DO  [facility]  [facility]   Electronically Signed   By: Gilmer Mor D.O.   On: 04/15/2015 13:37   Dg Chest Portable 1 View  04/13/2015   CLINICAL DATA:  Altered mental status, fall  EXAM: PORTABLE CHEST - 1 VIEW  COMPARISON:  12/15/2011  FINDINGS: Mild cardiac enlargement stable. Atherosclerotic aortic calcifications stable. Cardiac pacer stable. Bronchitic change stable. No consolidation or effusion.  IMPRESSION: Stable chronic findings with no acute process   Electronically Signed    By: Esperanza Heir M.D.   On: 04/13/2015 12:34         Subjective: Patient is pleasantly confused but denies any fevers, chills, chest pain, short of breath, abdominal pain, hematochezia, melena. No headaches or visual disturbance.  Objective: Filed Vitals:   04/16/15 0500 04/16/15 0606 04/16/15 0855 04/16/15 1420  BP: 169/51 152/58 143/55 151/79  Pulse: 71  69 76  Temp: 97.5 F (36.4 C)  99.3 F (37.4 C) 97.7 F (36.5 C)  TempSrc: Oral  Axillary Oral  Resp:   16   Height:      Weight: 46.811 kg (103 lb 3.2 oz)     SpO2: 97%  94% 100%    Intake/Output Summary (Last 24 hours) at 04/16/15 1759 Last data filed at 04/16/15 1503  Gross per 24 hour  Intake    685 ml  Output   3600 ml  Net  -2915 ml   Weight change: 1.633 kg (3 lb 9.6 oz) Exam:   General:  Pt is alert, follows commands appropriately, not in acute distress  HEENT: No icterus, No thrush, No neck mass, [place]/AT  Cardiovascular: RRR, S1/S2, no rubs, no gallops  Respiratory: Fine bibasilar crackles. No wheezing. Good air movement  Abdomen: Soft/+BS, non tender, non distended, no guarding; no hepatosplenomegaly  Extremities: No edema, No lymphangitis, No petechiae, No rashes, no synovitis; no cyanosis or clubbing  Data Reviewed: Basic Metabolic Panel:  Recent Labs Lab 04/13/15 1250 04/13/15 1459 04/14/15 0558 04/15/15 0315 04/16/15 0258  NA 138 135 136 140 132*  K 5.6* 5.1 4.1 4.1 3.2*  CL 109 106 109 112* 98*  CO2 22 19* 21* 22 24  GLUCOSE 278* 289* 152* 138* 177*  BUN 30* 30* 27* 20 13  CREATININE 1.29* 1.18* 1.28* 1.08* 1.13*  CALCIUM 7.9* 7.6* 7.4* 7.9* 7.8*   Liver Function Tests:  Recent Labs Lab 04/13/15 1209 04/13/15 1250  AST 28 19  ALT 8* 9*  ALKPHOS 48 46  BILITOT 1.0 0.9  PROT 4.7* 5.1*  ALBUMIN 2.7* 2.5*   No results for input(s): LIPASE, AMYLASE in the last 168 hours.  Recent Labs Lab 04/14/15 1710  AMMONIA <9*   CBC:  Recent Labs Lab 04/13/15 1209  04/14/15 0558 04/14/15 1710 04/15/15 0315 04/16/15 0258  WBC 18.8* 14.1*  --  18.0* 12.3*  HGB 10.6* 7.8*  --  7.3* 13.6  HCT 32.9* 23.6* 22.1* 22.4* 40.7  MCV 97.6 97.9  --  97.8 89.8  PLT 395 293  --  296 198   Cardiac Enzymes:  Recent Labs Lab 04/13/15 1209  CKTOTAL 57   BNP: Invalid input(s): POCBNP CBG:  Recent Labs Lab 04/13/15 1201 04/13/15 2213 04/14/15 0717  GLUCAP 250* 179* 159*    Recent Results (from the past 240 hour(s))  Culture, blood (routine x 2)     Status: None (Preliminary result)   Collection Time: 04/13/15 11:58 AM  Result Value Ref Range Status   Specimen Description BLOOD LEFT ANTECUBITAL  Final   Special Requests BOTTLES DRAWN AEROBIC ONLY 4CC  Final   Culture NO GROWTH 3 DAYS  Final   Report Status PENDING  Incomplete  Culture, blood (routine x 2)     Status: None (Preliminary result)   Collection Time: 04/13/15 12:50 PM  Result Value Ref Range Status   Specimen Description BLOOD LEFT WRIST  Final   Special Requests BOTTLES DRAWN AEROBIC ONLY 3CC  Final   Culture NO GROWTH 3 DAYS  Final   Report Status PENDING  Incomplete  Gram stain     Status: None   Collection Time: 04/13/15  3:39 PM  Result Value Ref Range Status   Specimen Description URINE, CATHETERIZED  Final   Special Requests Normal  Final   Gram Stain   Final    CYTOSPIN SMEAR WBC PRESENT,BOTH PMN AND MONONUCLEAR NO ORGANISMS SEEN    Report Status 04/13/2015 FINAL  Final  MRSA PCR Screening     Status: Abnormal   Collection Time: 04/13/15  6:25 PM  Result Value Ref Range Status   MRSA by PCR POSITIVE (A) NEGATIVE Final    Comment:        The GeneXpert MRSA Assay (FDA approved for NASAL specimens only), is one component of a comprehensive MRSA colonization surveillance program. It is not intended to diagnose MRSA infection nor to guide or monitor treatment for MRSA infections. RESULT CALLED TO, READ BACK BY AND VERIFIED WITH: S.STOWE,RN AT 2124 BY L.PITT  04/13/15      Scheduled Meds: . Chlorhexidine Gluconate Cloth  6 each Topical Q0600  . docusate sodium  100 mg Oral BID  . furosemide  40 mg Intravenous Daily  . mupirocin ointment  1 application Nasal BID  . piperacillin-tazobactam (ZOSYN)  IV  2.25 g Intravenous Q8H  . QUEtiapine  25 mg Oral QHS  . senna  2 tablet Oral Daily  . sodium chloride  3 mL Intravenous Q12H  . vancomycin  500 mg Intravenous Q48H   Continuous Infusions:    TAT, DAVID, DO  [facility] Pager [phone]  If 7PM-7AM, please contact night-coverage [email or web address] Password [email or web address] 04/16/2015, 5:59 PM   LOS: 3 days

## 2015-04-16 NOTE — Progress Notes (Signed)
OT Cancellation Note  Patient Details Name: Lynn Weeks MRN: [id number] DOB: Feb 07, 1919   Cancelled Treatment:    Reason Eval/Treat Not Completed:  (OT screened)  Pt is Medicare and current D/C plan is SNF. No apparent immediate acute care OT needs, therefore will defer OT to SNF. If OT eval is needed please call Acute Rehab Dept. at [phone] or text page OT at [phone].    Earlie Raveling  OTR/L [phone]   04/16/2015, 4:21 PM

## 2015-04-16 NOTE — Care Management Important Message (Signed)
Important Message  Patient Details  Name: Lynn Weeks MRN: [id number] Date of Birth: 07-Apr-1919   Medicare Important Message Given:  Yes-second notification given    [name] 04/16/2015, 4:40 PM

## 2015-04-16 NOTE — Care Management Note (Addendum)
Case Management Note  Patient Details  Name: Lynn Weeks MRN: [id number] Date of Birth: August 26, 1918  Subjective/Objective:     Pt admitted for Sepsis and Confusion. Pt is from home and plan is for SNF once medically stable.                Action/Plan: CM did speak with family in regards to [name]. CM did provide list for [name] agency and Personal Care Provider List. Family stated pt will benefit from SNF, however wanted information for once pt stable to return home post SNF. No further needs from CM at this time.   Expected Discharge Date:                  Expected Discharge Plan:  Skilled Nursing Facility  In-House Referral:  Clinical Social Work  Discharge planning Services  CM Consult  Post Acute Care Choice:    Choice offered to:     DME Arranged:    DME Agency:     HH Arranged:    HH Agency:     Status of Service:  Completed, signed off  Medicare Important Message Given:  Yes-second notification given Date Medicare IM Given:    Medicare IM give by:    Date Additional Medicare IM Given:    Additional Medicare Important Message give by:     If discussed at Long Length of Stay Meetings, dates discussed:    Additional Comments:  1313 04-18-15 [name], RN,BSN [phone] Plan for d/c to SNF today.   1026 04-18-15 [name], [place] [phone] Pt's choice for SNF would be [facility]- If pt not able to go choice per CSW will be home with [facility] services. CM will continue to monitor.   1208 04-17-15 [name], RN,BSN [phone] Pt with increased WBC count. Initiated on iv Zosyn. Plan will be for SNF once stable. No further needs from CM at this time.  Gala Lewandowsky, RN 04/16/2015, 10:53 AM

## 2015-04-16 NOTE — Progress Notes (Signed)
  Echocardiogram 2D Echocardiogram has been performed.  Janalyn Harder 04/16/2015, 9:57 AM

## 2015-04-16 NOTE — Progress Notes (Signed)
Physical Therapy Treatment Patient Details Name: Lynn Weeks MRN: [id number] DOB: 08-15-1918 Today's Date: 04/16/2015    History of Present Illness Lynn Weeks is a 81 y.o. female with PMH of DM, HPL, h/o CVA, SSS s/p PPM presented after a fall at home. Pt dx with hypothermia, acute encephalopathy, ABLA, acute on chronic renal failure, hypotension, metabolic abnormalities, hyperkalemia, scalp hematoma from fall.  CT negative for subdural, and lactic acidosis.     PT Comments    Pt is progressing well with her mobility and level of alertness today. She requires two person assist for gait, but can be one person assist for sit to stand and transfers.  She is pleasantly confused and generally cooperative.  I would continue to recommend SNF placement at discharge.    Follow Up Recommendations  SNF     Equipment Recommendations  None recommended by PT    Recommendations for Other Services   NA     Precautions / Restrictions Precautions Precautions: Fall Precaution Comments: high fall risk.  Very unsteady on her feet.     Mobility  Bed Mobility Overal bed mobility: Needs Assistance Bed Mobility: Supine to Sit     Supine to sit: Min assist     General bed mobility comments: Min hand held assist to pull up to sitting EOB.   Transfers Overall transfer level: Needs assistance Equipment used: 1 person hand held assist Transfers: Sit to/from Stand Sit to Stand: Mod assist         General transfer comment: One person mod assist to stand from bed.  Pt with posterior lean throughout.  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   Ambulation/Gait Ambulation/Gait assistance: +2 physical assistance;Mod assist Ambulation Distance (Feet): 100 Feet Assistive device: 2 person hand held assist Gait Pattern/deviations: Step-through pattern;Staggering left;Staggering right   Gait velocity interpretation: <1.8 ft/sec, indicative of risk for recurrent falls General Gait Details: Pt  needed two person mod assist for safety during gait. Both hands supported.  Pt with narrow base and almost vaulting type gait pattern, pt reports she is "shorter on one side" ? if this means she has one leg that is shorter.  Weak and shanking legs.         Modified Rankin (Stroke Patients Only) Modified Rankin (Stroke Patients Only) Pre-Morbid Rankin Score: Moderate disability Modified Rankin: Moderately severe disability     Balance Overall balance assessment: Needs assistance Sitting-balance support: Feet supported;Bilateral upper extremity supported Sitting balance-Leahy Scale: Poor   Postural control: Posterior lean Standing balance support: Bilateral upper extremity supported Standing balance-Leahy Scale: Poor                      Cognition Arousal/Alertness: Awake/alert Behavior During Therapy: WFL for tasks assessed/performed Overall Cognitive Status: History of cognitive impairments - at baseline                      Exercises General Exercises - Upper Extremity Shoulder Flexion: AAROM;Both;10 reps;Seated Elbow Flexion: AAROM;Both;10 reps;Seated Elbow Extension: AAROM;Both;10 reps;Seated General Exercises - Lower Extremity Ankle Circles/Pumps: AROM;Both;10 reps;Seated Long Arc Quad: AROM;Both;10 reps;Seated Hip Flexion/Marching: AAROM;Both;10 reps;Seated        Pertinent Vitals/Pain Pain Assessment: Faces Faces Pain Scale: Hurts little more Pain Location: with contact of head to bed/pillow.  Pain Descriptors / Indicators: Grimacing Pain Intervention(s): Limited activity within patient's tolerance;Monitored during session;Repositioned           PT Goals (current goals can now be found in the  care plan section) Acute Rehab PT Goals Patient Stated Goal: to go home Progress towards PT goals: Progressing toward goals    Frequency  Min 2X/week    PT Plan Frequency needs to be updated       End of Session Equipment Utilized During  Treatment: Gait belt Activity Tolerance: Patient tolerated treatment well Patient left: in bed;with call bell/phone within reach;with bed alarm set     Time: 1350-1420 PT Time Calculation (min) (ACUTE ONLY): 30 min  Charges:  $Gait Training: 8-22 mins $Therapeutic Exercise: 8-22 mins                     Rebecca B. Medendorp, PT, DPT [phone]   04/16/2015, 2:38 PM

## 2015-04-17 DIAGNOSIS — A4150 Gram-negative sepsis, unspecified: Secondary | ICD-10-CM

## 2015-04-17 DIAGNOSIS — R7881 Bacteremia: Secondary | ICD-10-CM

## 2015-04-17 LAB — CBC
HEMATOCRIT: 33.9 % — AB (ref 36.0–46.0)
Hemoglobin: 11.1 g/dL — ABNORMAL LOW (ref 12.0–15.0)
MCH: 29.8 pg (ref 26.0–34.0)
MCHC: 32.7 g/dL (ref 30.0–36.0)
MCV: 90.9 fL (ref 78.0–100.0)
Platelets: 288 10*3/uL (ref 150–400)
RBC: 3.73 MIL/uL — ABNORMAL LOW (ref 3.87–5.11)
RDW: 17.5 % — AB (ref 11.5–15.5)
WBC: 14.2 10*3/uL — ABNORMAL HIGH (ref 4.0–10.5)

## 2015-04-17 LAB — BASIC METABOLIC PANEL
Anion gap: 10 (ref 5–15)
BUN: 12 mg/dL (ref 6–20)
CALCIUM: 8.1 mg/dL — AB (ref 8.9–10.3)
CO2: 27 mmol/L (ref 22–32)
Chloride: 100 mmol/L — ABNORMAL LOW (ref 101–111)
Creatinine, Ser: 1.2 mg/dL — ABNORMAL HIGH (ref 0.44–1.00)
GFR calc Af Amer: 43 mL/min — ABNORMAL LOW (ref >60)
GFR, EST NON AFRICAN AMERICAN: 37 mL/min — AB (ref >60)
GLUCOSE: 145 mg/dL — AB (ref 65–99)
Potassium: 3.1 mmol/L — ABNORMAL LOW (ref 3.5–5.1)
Sodium: 137 mmol/L (ref 135–145)

## 2015-04-17 MED ORDER — POTASSIUM CHLORIDE CRYS ER 20 MEQ PO TBCR
40.0000 meq | EXTENDED_RELEASE_TABLET | Freq: Once | ORAL | Status: AC
  Administered 2015-04-17: 40 meq via ORAL
  Filled 2015-04-17: qty 2

## 2015-04-17 MED ORDER — PIPERACILLIN-TAZOBACTAM IN DEX 2-0.25 GM/50ML IV SOLN
2.2500 g | Freq: Three times a day (TID) | INTRAVENOUS | Status: DC
  Administered 2015-04-17 – 2015-04-18 (×4): 2.25 g via INTRAVENOUS
  Filled 2015-04-17 (×8): qty 50

## 2015-04-17 MED ORDER — CARVEDILOL 3.125 MG PO TABS
3.1250 mg | ORAL_TABLET | Freq: Two times a day (BID) | ORAL | Status: DC
  Administered 2015-04-17 – 2015-04-18 (×2): 3.125 mg via ORAL
  Filled 2015-04-17 (×2): qty 1

## 2015-04-17 NOTE — Progress Notes (Signed)
UR Completed Brenda Graves-Bigelow, RN,BSN [phone]  

## 2015-04-17 NOTE — Progress Notes (Signed)
PROGRESS NOTE  Lynn Weeks GNF:[id number] DOB: 1919/05/12 DOA: 04/13/2015 PCP: Ailene Ravel, MD Brief history 81 year old female with a history of abuse mellitus, hyperlipidemia, stroke, sick sinus syndrome, dementia, complete heart block presented after an unwitnessed mechanical fall. Due to the patient's acute encephalopathy. She is not able to provide any history. Apparently, it was surmised by the patient's granddaughter who is an Charity fundraiser that the patient had a fall in the bathroom on the evening of 04/12/2015. The patient was found lying in her bed awake with blood dripping from her posterior scalp that tracked from the bathroom on the morning of 04/13/2015 when she was found by her granddaughter. EMS was activated. In the emergency department, the patient was noted to be hypothermic. The patient was also noted to have lactic acidosis of 2.79. She was treated for presumptive sepsis and started on empiric antibiotics An intravenous fluids. Unfortunately, the patient developed volume overload and had to be started on IV furosemide. The patient had a good clinical response and was weaned off of oxygen. The patient was tentatively planned for discharge on 04/17/2015. However, her blood cultures drawn on 04/13/2015 became positive in early morning of 04/17/2015. Assessment/Plan: Hypothermia/Bacteremia--GNR -Hypothermia Resolved -a.m. Cortisol--17.0 -04/13/15 blood cultures became positive early am 04/17/15-->restart IV abx -adjust abx pending final culture data -PCT--0.23 -initially--Chest x-ray negative for acute infiltrates or pulmonary edema -Urinalysis negative for pyuria -EKG is without any concerning ischemic changes  -Check TSH --1.706 Acute respiratory failure with hypoxia -Secondary to acute diastolic CHF -Neg 4.5 with IV Lasix-->discontinue as pt now appears euvolemic -now stable on RA Acute encephalopathy  -Multifactorial including ABLA, acute on chronic renal  failure, possible infectious process, hypotension, fluid overload, metabolic arrangements including hyperkalemia, and day-nite reversion from hospitalization in the setting of underlying dementia  -Repeat CT brain--neg subdural hematoma  -Check ammonia--<9 -TSH--1.706 -Urinalysis negative for pyuria -ABG--7.46/30/68/21 on 1 L -03/1915--mental status back to baseline -seroquel q hs per family request Acute Diastolic CHF -04/16/2015 echo EF 55-60%, grade 1 diastolic dysfunction,no WMA, mild AS, AI, mod TR -NEG 4525 cc with IV Lasix -d/c lasix after today's dose -pt appears more euvolemic today -BNP 1084 Diabetes mellitus type 2, controlled -04/13/2015 hemoglobin A1c 6.8  -Discontinue sliding scale insulin  -Discontinue CBG checks  -Allow for liberal glycemic control  Lactic acidosis -Likely due to volume depletion as well as metformin use in the setting of baseline creatinine clearance approximately 30 -will not restart metformin after discharge -now resolved Acute on chronic renal failure (CKD stage IIII-V) -Baseline creatinine 0.7-1.0  -Initially due to volume depletion   -Discontinue lisinopril--will not restart after d/c Normocytic anemia /Acute Blood Loss Anemia -Unfortunately, the patient has not had any blood work noted for 3 years  -Hemoglobin September 2013 was 14 which appeared to be her previous baseline  -The patient may also have acute blood loss anemia secondary to her scalp hematoma -iron sat 8%, ferritin 28--given dose of nulecit and eventually start po iron -B12--638 -RBC folate--1762 -Fecal hemeoccult test  -04/15/15--Transfused 2 units PRBC Leukocytosis -WBC increased from blood transfusion -Remains afebrile and hemodynamically stable -d/c IV abx after today's doses -Granddaughter states that patient has had chronic the baby's elevation, 13-14 range hypertension  -Discontinue lisinopril -restart low dose carvedilol in setting of CHF History of  stroke -Hold Plavix for now given the patient's recent fall and scalp hematoma SSS/Complete HB -keep telemetry another 24 hours if possible -s/p PPM Hypokalemia -replete -check  mag Family Communication: Family updated at beside 9/20 Disposition Plan: SNF 04/18/15 or 9.21       Procedures/Studies: Ct Head Wo Contrast  04/14/2015   CLINICAL DATA:  Acute encephalopathy.  The patient was found lying in her bed awake with blood dripping from her posterior scalp that tracked from the bathroom on the morning of 04/13/2015 when she was found by her granddaughter. EMS was activated. In the emergency department, the patient was noted to be hypothermic. The patient was also noted to have lactic acidosis of 2.79. She was treated for presumptive sepsis and started on empiric antibiotics. F/u yesterdays scan today  EXAM: CT HEAD WITHOUT CONTRAST  TECHNIQUE: Contiguous axial images were obtained from the base of the skull through the vertex without intravenous contrast.  COMPARISON:  04/13/2015  FINDINGS: Ventricles are normal configuration. There is ventricular and sulcal enlargement reflecting moderate atrophy. No hydrocephalus.  There are no parenchymal masses or mass effect. There is no evidence of a recent cortical infarct. There is an old infarct involving the inferior right cerebellum. Small lacune infarcts noted in the left base a ganglia, left thalamus and right caudate nucleus head. Patchy white matter hypoattenuation is noted in the periventricular white matter consistent with mild to moderate chronic microvascular ischemic change.  There are no extra-axial masses or abnormal fluid collections.  There is no intracranial hemorrhage.  Visualized sinuses are clear other than minor posterior right maxillary sinus mucosal thickening.  IMPRESSION: 1. No acute intracranial abnormalities. No change from the previous day's study. 2. Atrophy, small old lacune infarcts and mild to moderate chronic  microvascular ischemic change.   Electronically Signed   By: Amie Portland M.D.   On: 04/14/2015 18:49   Ct Head Wo Contrast  04/13/2015   CLINICAL DATA:  Fall today, hematoma left posterior head, decreased level of consciousness  EXAM: CT HEAD WITHOUT CONTRAST  CT CERVICAL SPINE WITHOUT CONTRAST  TECHNIQUE: Multidetector CT imaging of the head and cervical spine was performed following the standard protocol without intravenous contrast. Multiplanar CT image reconstructions of the cervical spine were also generated.  COMPARISON:  03/23/2012  FINDINGS: CT HEAD FINDINGS  Posterior left scalp hematoma. No intracranial hemorrhage or extra-axial fluid. No evidence of vascular territory infarct. Severe atrophy and white matter disease. Probable lacunar infarct left thalamus and left basal ganglia, with a subacute to chronic appearance but not 1,013.  No skull fracture.  CT CERVICAL SPINE FINDINGS  Levoscoliosis. Severe degenerative disc disease throughout the cervical spine. Severe facet disease also present. No fracture identified. Mild anterior listhesis of C4 on C5. Mild anterior listhesis of C7 on T1. Severe degenerative disc disease at C5-6 and C6-7.  IMPRESSION: Severe chronic change in both the head and cervical spine with no acute traumatic injury other than left scalp hematoma.   Electronically Signed   By: Esperanza Heir M.D.   On: 04/13/2015 13:10   Ct Cervical Spine Wo Contrast  04/13/2015   CLINICAL DATA:  Fall today, hematoma left posterior head, decreased level of consciousness  EXAM: CT HEAD WITHOUT CONTRAST  CT CERVICAL SPINE WITHOUT CONTRAST  TECHNIQUE: Multidetector CT imaging of the head and cervical spine was performed following the standard protocol without intravenous contrast. Multiplanar CT image reconstructions of the cervical spine were also generated.  COMPARISON:  03/23/2012  FINDINGS: CT HEAD FINDINGS  Posterior left scalp hematoma. No intracranial hemorrhage or extra-axial fluid. No  evidence of vascular territory infarct. Severe atrophy and white matter disease. Probable lacunar infarct  left thalamus and left basal ganglia, with a subacute to chronic appearance but not 1,013.  No skull fracture.  CT CERVICAL SPINE FINDINGS  Levoscoliosis. Severe degenerative disc disease throughout the cervical spine. Severe facet disease also present. No fracture identified. Mild anterior listhesis of C4 on C5. Mild anterior listhesis of C7 on T1. Severe degenerative disc disease at C5-6 and C6-7.  IMPRESSION: Severe chronic change in both the head and cervical spine with no acute traumatic injury other than left scalp hematoma.   Electronically Signed   By: Esperanza Heir M.D.   On: 04/13/2015 13:10   Dg Chest Port 1 View  04/15/2015   CLINICAL DATA:  82 year old female with a history of wheezing and shortness of breath. Fall several days prior. Fever.  EXAM: PORTABLE CHEST - 1 VIEW  COMPARISON:  04/13/2015  FINDINGS: Cardiomediastinal silhouette likely unchanged, though partially obscured by overlying lung and pleural disease.  Interstitial opacities with peribronchial cuffing and interlobular septal thickening, significantly increased from the recent comparison.  Dense opacities the bilateral lung bases with obscuration the hemidiaphragms and the heart borders.  Cardiac pacing device on left chest wall unchanged with 2 leads in place.  Osteopenia.  Surgical changes of right shoulder arthroplasty.  Apices of the lungs not well evaluated secondary to overlying soft tissues of the chin.  IMPRESSION: Interval development of interstitial opacities and interlobular septal thickening extending from the bilateral hila, most concerning for edema and bilateral pleural effusions given the short interval change, however, infection difficult to exclude.  Signed,  Yvone Neu. Loreta Ave, DO  [facility]  [facility]   Electronically Signed   By: Gilmer Mor D.O.   On:  04/15/2015 13:37   Dg Chest Portable 1 View  04/13/2015   CLINICAL DATA:  Altered mental status, fall  EXAM: PORTABLE CHEST - 1 VIEW  COMPARISON:  12/15/2011  FINDINGS: Mild cardiac enlargement stable. Atherosclerotic aortic calcifications stable. Cardiac pacer stable. Bronchitic change stable. No consolidation or effusion.  IMPRESSION: Stable chronic findings with no acute process   Electronically Signed   By: Esperanza Heir M.D.   On: 04/13/2015 12:34        Subjective:   Objective: Filed Vitals:   04/16/15 2350 04/17/15 0511 04/17/15 0742 04/17/15 1251  BP: 159/47 170/48 139/50 132/46  Pulse:   74 81  Temp: 98.4 F (36.9 C) 98.6 F (37 C) 99.1 F (37.3 C) 98.4 F (36.9 C)  TempSrc: Axillary Axillary Axillary Oral  Resp: 18 19    Height:      Weight:  46.358 kg (102 lb 3.2 oz)    SpO2: 97% 98% 91% 96%    Intake/Output Summary (Last 24 hours) at 04/17/15 1314 Last data filed at 04/17/15 0948  Gross per 24 hour  Intake    620 ml  Output   1025 ml  Net   -405 ml   Weight change: -0.454 kg (-1 lb) Exam:   General:  Pt is alert, follows commands appropriately, not in acute distress  HEENT: No icterus, No thrush, No neck mass, [place]/AT  Cardiovascular: RRR, S1/S2, no rubs, no gallops  Respiratory: CTA bilaterally, no wheezing, no crackles, no rhonchi  Abdomen: Soft/+BS, non tender, non distended, no guarding  Extremities: No edema, No lymphangitis, No petechiae, No rashes, no synovitis  Data Reviewed: Basic Metabolic Panel:  Recent Labs Lab 04/13/15 1459 04/14/15 0558 04/15/15 0315 04/16/15 0258 04/17/15 0504  NA 135 136 140 132* 137  K 5.1  4.1 4.1 3.2* 3.1*  CL 106 109 112* 98* 100*  CO2 19* 21* GLUCOSE 289* 152* 138* 177* 145*  BUN 30* 27* CREATININE 1.18* 1.28* 1.08* 1.13* 1.20*  CALCIUM 7.6* 7.4* 7.9* 7.8* 8.1*   Liver Function Tests:  Recent Labs Lab 04/13/15 1209 04/13/15 1250  AST 28 19  ALT 8* 9*  ALKPHOS 48 46    BILITOT 1.0 0.9  PROT 4.7* 5.1*  ALBUMIN 2.7* 2.5*   No results for input(s): LIPASE, AMYLASE in the last 168 hours.  Recent Labs Lab 04/14/15 1710  AMMONIA <9*   CBC:  Recent Labs Lab 04/13/15 1209 04/14/15 0558 04/14/15 1710 04/15/15 0315 04/16/15 0258 04/17/15 0504  WBC 18.8* 14.1*  --  18.0* 12.3* 14.2*  HGB 10.6* 7.8*  --  7.3* 13.6 11.1*  HCT 32.9* 23.6* 22.1* 22.4* 40.7 33.9*  MCV 97.6 97.9  --  97.8 89.8 90.9  PLT 395 293  --  296 198 288   Cardiac Enzymes:  Recent Labs Lab 04/13/15 1209  CKTOTAL 57   BNP: Invalid input(s): POCBNP CBG:  Recent Labs Lab 04/13/15 1201 04/13/15 2213 04/14/15 0717  GLUCAP 250* 179* 159*    Recent Results (from the past 240 hour(s))  Culture, blood (routine x 2)     Status: None (Preliminary result)   Collection Time: 04/13/15 11:58 AM  Result Value Ref Range Status   Specimen Description BLOOD LEFT ANTECUBITAL  Final   Special Requests BOTTLES DRAWN AEROBIC ONLY 4CC  Final   Culture  Setup Time   Final    GRAM NEGATIVE RODS BOTTLES DRAWN AEROBIC ONLY CRITICAL RESULT CALLED TO, READ BACK BY AND VERIFIED WITH: M. Kerman Passey AT 0734 ON [id number] BY Lucienne Capers    Culture GRAM NEGATIVE RODS  Final   Report Status PENDING  Incomplete  Culture, blood (routine x 2)     Status: None (Preliminary result)   Collection Time: 04/13/15 12:50 PM  Result Value Ref Range Status   Specimen Description BLOOD LEFT WRIST  Final   Special Requests BOTTLES DRAWN AEROBIC ONLY 3CC  Final   Culture NO GROWTH 3 DAYS  Final   Report Status PENDING  Incomplete  Gram stain     Status: None   Collection Time: 04/13/15  3:39 PM  Result Value Ref Range Status   Specimen Description URINE, CATHETERIZED  Final   Special Requests Normal  Final   Gram Stain   Final    CYTOSPIN SMEAR WBC PRESENT,BOTH PMN AND MONONUCLEAR NO ORGANISMS SEEN    Report Status 04/13/2015 FINAL  Final  MRSA PCR Screening     Status: Abnormal   Collection Time:  04/13/15  6:25 PM  Result Value Ref Range Status   MRSA by PCR POSITIVE (A) NEGATIVE Final    Comment:        The GeneXpert MRSA Assay (FDA approved for NASAL specimens only), is one component of a comprehensive MRSA colonization surveillance program. It is not intended to diagnose MRSA infection nor to guide or monitor treatment for MRSA infections. RESULT CALLED TO, READ BACK BY AND VERIFIED WITH: S.STOWE,RN AT 2124 BY L.PITT 04/13/15      Scheduled Meds: . Chlorhexidine Gluconate Cloth  6 each Topical Q0600  . docusate sodium  100 mg Oral BID  . mupirocin ointment  1 application Nasal BID  . piperacillin-tazobactam (ZOSYN)  IV  2.25 g Intravenous 3 times per day  . QUEtiapine  25  mg Oral QHS  . senna  2 tablet Oral Daily  . sodium chloride  3 mL Intravenous Q12H   Continuous Infusions:    TAT, DAVID, DO  [facility] Pager [phone]  If 7PM-7AM, please contact night-coverage [email or web address] Password [email or web address] 04/17/2015, 1:14 PM   LOS: 4 days

## 2015-04-17 NOTE — Progress Notes (Signed)
Bed offer in place from [facility] and accepted by family.  DC was anticipated for today- however- patient has been placed back on IV antibiotics due to a positive blood culture.  CSW spoke throughout the day to patient's granddaughter Lynn Weeks and to [facility].  The facility only has one room available at this time (semi-private) and will not be able to hold the bed beyond tomorrow.  Facility WILL accept patient with IV antibiotics as long as patient is not a infectious disease risk to the current resident in the room.  Patient is MRSA + in her NAREs but has been treated 5 days with Bactroban per unit RN.  CSW will monitor in the morning and talk to MD.  Discussed with granddaughter that should patient not be able to be d/c'd and the bed be lost at the SNF- the family would need to choose another SNF bed that is available.  Granddaughter spoke to her father and indicated that should this happen- the family plans to take patient home with home health and IV antibiotics rather than choose another SNF bed.  They only want [facility]. Granddaughter states that she is a Armed forces technical officer and would be able to manage the IV antbiotics at home if necessary.  Lynn Weeks. Lynn Weeks, [place] [phone] (coverage)

## 2015-04-17 NOTE — Progress Notes (Signed)
CRITICAL VALUE ALERT  Critical value received:  Blood Cultures + for Gram Negative Rods  Date of notification:  04/17/2015  Time of notification:  07:35  Critical value read back:Yes.    Nurse who received alert:  Glade Lloyd, RN  MD notified (1st page): Dr. Arbutus Leas  Time of first page:  07:38   Responding MD:  Dr. Arbutus Leas  Time MD responded:  07:41

## 2015-04-18 DIAGNOSIS — G9340 Encephalopathy, unspecified: Secondary | ICD-10-CM | POA: Diagnosis not present

## 2015-04-18 DIAGNOSIS — I129 Hypertensive chronic kidney disease with stage 1 through stage 4 chronic kidney disease, or unspecified chronic kidney disease: Secondary | ICD-10-CM | POA: Diagnosis not present

## 2015-04-18 DIAGNOSIS — I5031 Acute diastolic (congestive) heart failure: Secondary | ICD-10-CM

## 2015-04-18 DIAGNOSIS — D649 Anemia, unspecified: Secondary | ICD-10-CM | POA: Diagnosis not present

## 2015-04-18 DIAGNOSIS — G9349 Other encephalopathy: Secondary | ICD-10-CM | POA: Diagnosis not present

## 2015-04-18 DIAGNOSIS — S0003XD Contusion of scalp, subsequent encounter: Secondary | ICD-10-CM | POA: Diagnosis not present

## 2015-04-18 DIAGNOSIS — H109 Unspecified conjunctivitis: Secondary | ICD-10-CM | POA: Diagnosis not present

## 2015-04-18 DIAGNOSIS — S0083XD Contusion of other part of head, subsequent encounter: Secondary | ICD-10-CM | POA: Diagnosis not present

## 2015-04-18 DIAGNOSIS — M6281 Muscle weakness (generalized): Secondary | ICD-10-CM | POA: Diagnosis not present

## 2015-04-18 DIAGNOSIS — N183 Chronic kidney disease, stage 3 (moderate): Secondary | ICD-10-CM | POA: Diagnosis not present

## 2015-04-18 DIAGNOSIS — R488 Other symbolic dysfunctions: Secondary | ICD-10-CM | POA: Diagnosis not present

## 2015-04-18 DIAGNOSIS — E1165 Type 2 diabetes mellitus with hyperglycemia: Secondary | ICD-10-CM | POA: Diagnosis not present

## 2015-04-18 DIAGNOSIS — G301 Alzheimer's disease with late onset: Secondary | ICD-10-CM | POA: Diagnosis not present

## 2015-04-18 DIAGNOSIS — R2681 Unsteadiness on feet: Secondary | ICD-10-CM | POA: Diagnosis not present

## 2015-04-18 DIAGNOSIS — F0391 Unspecified dementia with behavioral disturbance: Secondary | ICD-10-CM | POA: Diagnosis not present

## 2015-04-18 DIAGNOSIS — D508 Other iron deficiency anemias: Secondary | ICD-10-CM | POA: Diagnosis not present

## 2015-04-18 DIAGNOSIS — I442 Atrioventricular block, complete: Secondary | ICD-10-CM | POA: Diagnosis not present

## 2015-04-18 DIAGNOSIS — Z9181 History of falling: Secondary | ICD-10-CM | POA: Diagnosis not present

## 2015-04-18 DIAGNOSIS — R7881 Bacteremia: Secondary | ICD-10-CM | POA: Diagnosis not present

## 2015-04-18 DIAGNOSIS — H04123 Dry eye syndrome of bilateral lacrimal glands: Secondary | ICD-10-CM | POA: Diagnosis not present

## 2015-04-18 DIAGNOSIS — I5032 Chronic diastolic (congestive) heart failure: Secondary | ICD-10-CM | POA: Diagnosis not present

## 2015-04-18 DIAGNOSIS — R2689 Other abnormalities of gait and mobility: Secondary | ICD-10-CM | POA: Diagnosis not present

## 2015-04-18 DIAGNOSIS — N179 Acute kidney failure, unspecified: Secondary | ICD-10-CM | POA: Diagnosis not present

## 2015-04-18 LAB — CULTURE, BLOOD (ROUTINE X 2): Culture: NO GROWTH

## 2015-04-18 LAB — BASIC METABOLIC PANEL
ANION GAP: 7 (ref 5–15)
BUN: 11 mg/dL (ref 6–20)
CO2: 28 mmol/L (ref 22–32)
Calcium: 8.2 mg/dL — ABNORMAL LOW (ref 8.9–10.3)
Chloride: 104 mmol/L (ref 101–111)
Creatinine, Ser: 1.13 mg/dL — ABNORMAL HIGH (ref 0.44–1.00)
GFR calc Af Amer: 47 mL/min — ABNORMAL LOW (ref >60)
GFR, EST NON AFRICAN AMERICAN: 40 mL/min — AB (ref >60)
GLUCOSE: 140 mg/dL — AB (ref 65–99)
POTASSIUM: 3.6 mmol/L (ref 3.5–5.1)
Sodium: 139 mmol/L (ref 135–145)

## 2015-04-18 LAB — CBC
HCT: 34.4 % — ABNORMAL LOW (ref 36.0–46.0)
HEMOGLOBIN: 11.1 g/dL — AB (ref 12.0–15.0)
MCH: 30.1 pg (ref 26.0–34.0)
MCHC: 32.3 g/dL (ref 30.0–36.0)
MCV: 93.2 fL (ref 78.0–100.0)
Platelets: 286 10*3/uL (ref 150–400)
RBC: 3.69 MIL/uL — AB (ref 3.87–5.11)
RDW: 17.4 % — ABNORMAL HIGH (ref 11.5–15.5)
WBC: 12.8 10*3/uL — ABNORMAL HIGH (ref 4.0–10.5)

## 2015-04-18 LAB — PROCALCITONIN: PROCALCITONIN: 0.25 ng/mL

## 2015-04-18 LAB — MAGNESIUM: Magnesium: 1.7 mg/dL (ref 1.7–2.4)

## 2015-04-18 MED ORDER — CARVEDILOL 3.125 MG PO TABS: 3.1250 mg | ORAL_TABLET | Freq: Two times a day (BID) | ORAL | Status: AC

## 2015-04-18 MED ORDER — QUETIAPINE FUMARATE 25 MG PO TABS: 25.0000 mg | ORAL_TABLET | Freq: Every day | ORAL | Status: AC

## 2015-04-18 MED ORDER — PIPERACILLIN-TAZOBACTAM IN DEX 2-0.25 GM/50ML IV SOLN: 2.2500 g | Freq: Three times a day (TID) | INTRAVENOUS | Status: AC

## 2015-04-18 MED ORDER — SENNA 8.6 MG PO TABS: 1.0000 | ORAL_TABLET | Freq: Every day | ORAL | Status: AC

## 2015-04-18 NOTE — Discharge Summary (Signed)
Physician Discharge Summary  Lynn Weeks:[id number] DOB: 09-25-1918 DOA: 04/13/2015  PCP: Ailene Ravel, MD  Admit date: 04/13/2015 Discharge date: 04/18/2015  Time spent: 45 minutes  Recommendations for Outpatient Follow-up:  1. Please FU Blood Cultures from [facility] and either stop or change IV ZOsyn to appropriate Abx depending on the blood Cx, patients family is adamant to get her discharged to SNF today pending vital culture date and do not wish to pursue further workup   Discharge Diagnoses:    Fall   Scalp Hematoma   Complete heart block   Gram negative bacteremia   Leukocytosis-chronic    Hypothermia   Traumatic hematoma of scalp   Acute renal failure superimposed on stage 3 chronic kidney disease   SSS (sick sinus syndrome)   Acute encephalopathy   Hypoxemia   Acute blood loss anemia   Acute diastolic CHF (congestive heart failure)   Discharge Condition: stable  Diet recommendation: heart healthy, diabetic  Filed Weights   04/15/15 0456 04/16/15 0500 04/17/15 0511  Weight: 45.178 kg (99 lb 9.6 oz) 46.811 kg (103 lb 3.2 oz) 46.358 kg (102 lb 3.2 oz)    History of present illness:  Chief Complaint: fall  HPI: Lynn Weeks is a 81 y.o. female with PMH of DM, HPL, h/o CVA, SSS s/p PPM presented after a fall at home. Patient lives alone, Per her granddaughter (who is a Engineer, civil (consulting)), patient tried to use restroom 9/15 evening and fell in bathroom. Was found in bed 9/16 morning by nurse. EMS called. Patient is pleasantly demented. She denied acute chest pains, no SOB, no cough, no nausea, vomiting no abdominal pains, but reported some mild diarrhea. She also denies any focal weakness. -ED: patient found to have large occipital hematoma. Hypothermic. She was started on IV atx for possible sepsis  Hospital Course:  Hypothermia/Bacteremia--GNR -was hypothermic on admission, she was started on broad-spectrum antibiotic on admission for  presumed sepsis, she has chronic leukocytosis since 2012, no source of infection was found and hence antibiotic was stopped on day 4 on 9/19 -She was supposed to be discharged yesterday ( 9/20) by my partner Dr. Arbutus Leas when one of her blood cultures came back positive for gram-negative rods,  hence her discharge was held. -At this point continues to do well without any overt signs and symptoms of infection, no fevers , normal lactic acid. -She was restarted on IV Zosyn on 9/21 pending identification and sensitivities, there is a possibility that this could be a contaminant. -Chest x-ray and urine are unremarkable abdominal exam benign -Patients family namely Lynn Weeks, engineering and son are adamant about her discharging to Rehab today and donot wish to pursue any workup at this point. -They understand the risk of severe sepsis , morbidity and moratlity associated with it, and tell me that if she ever were to deteriorate at any point,  their focus would be comfort care only/Hospice, they want to continue Zosyn at SNF pending final culture results  Acute respiratory failure with hypoxia -Secondary to acute diastolic CHF -Neg 4.5 with IV Lasix-->discontinued as pt now appeared euvolemic -now stable on RA  Acute encephalopathy  -Multifactorial including ABLA, acute on chronic renal failure, possible infectious process, hypotension, fluid overload, metabolic arrangements including hyperkalemia, and day-nite reversion from hospitalization in the setting of underlying dementia  -Repeat CT brain--neg subdural hematoma  -Check ammonia--<9 -TSH--1.706 -Urinalysis negative for pyuria -ABG--7.46/30/68/21 on 1 L -03/1915--mental status back to baseline -seroquel q hs per family  request  Acute Diastolic CHF -04/16/2015 echo EF 55-60%, grade 1 diastolic dysfunction,no WMA, mild AS, AI, mod TR -NEG 4525 cc with IV Lasix -d/c lasix after today's dose -now euvolemic, doesn't need further lasix at this  point  Diabetes mellitus type 2, controlled -04/13/2015 hemoglobin A1c 6.8  -Discontinued sliding scale insulin  -Allow for liberal glycemic control   Lactic acidosis -Likely due to volume depletion as well as metformin use in the setting of baseline creatinine clearance approximately 30 -will not restart metformin after discharge -now resolved  Acute on chronic renal failure (CKD stage IIII-V) -Baseline creatinine 0.7-1.0  -Initially due to volume depletion   -Discontinue lisinopril--will not restart after d/c  Normocytic anemia /Acute Blood Loss Anemia -Unfortunately, the patient has not had any blood work noted for 3 years  -Hemoglobin September 2013 was 14 which appeared to be her previous baseline  -The patient may also have acute blood loss anemia secondary to her scalp hematoma -iron sat 8%, ferritin 28--given dose of nulecit and eventually start po iron -B12--638 -RBC folate--1762 -04/15/15--Transfused 2 units PRBC  Leukocytosis -Chronic per family and noted in EPic too -Remains afebrile and hemodynamically stable -WBC has been ranging from 14-19K since 2012  hypertension  -Discontinue lisinopril -restarted low dose carvedilol in setting of CHF  History of stroke -resumed Plavix, was held for few days given the patient's recent fall and scalp hematoma  SSS/Complete HB -s/p PPM  Hypokalemia -repleted  Discharge Exam: Filed Vitals:   04/18/15 1308  BP: 179/59  Pulse: 72  Temp: 98.7 F (37.1 C)  Resp:     General: AAOx2 Cardiovascular: S1S2/RRR Respiratory: CTAB  Discharge Instructions   Discharge Instructions    Diet - low sodium heart healthy    Complete by:  As directed      Diet Carb Modified    Complete by:  As directed      Increase activity slowly    Complete by:  As directed           Current Discharge Medication List    START taking these medications   Details  carvedilol (COREG) 3.125 MG tablet Take 1 tablet (3.125 mg  total) by mouth 2 (two) times daily with a meal.    piperacillin-tazobactam (ZOSYN) 2-0.25 GM/50ML IVPB Inject 50 mLs (2.25 g total) into the vein every 8 (eight) hours. Pending Identification of Bacteria, Please Call [facility] 9/22 afternoon for further ID and sensitivities Qty: 50 mL, Refills: 0    QUEtiapine (SEROQUEL) 25 MG tablet Take 1 tablet (25 mg total) by mouth at bedtime.    senna (SENOKOT) 8.6 MG TABS tablet Take 1 tablet (8.6 mg total) by mouth daily. Refills: 0      CONTINUE these medications which have NOT CHANGED   Details  acetaminophen (TYLENOL) 500 MG tablet Take 500 mg by mouth every 6 (six) hours as needed for mild pain.    bisacodyl (DULCOLAX) 5 MG EC tablet Take 10 mg by mouth daily as needed. For constipation    ciprofloxacin (CILOXAN) 0.3 % ophthalmic solution Place 1 drop into both eyes 4 (four) times daily - after meals and at bedtime. Refills: 1    clopidogrel (PLAVIX) 75 MG tablet Take 75 mg by mouth daily.    fluticasone (FLONASE) 50 MCG/ACT nasal spray Place 1 spray into both nostrils daily.    meclizine (ANTIVERT) 25 MG tablet Take 25 mg by mouth daily.      STOP taking these medications  carvedilol (COREG CR) 40 MG 24 hr capsule      lisinopril (PRINIVIL,ZESTRIL) 40 MG tablet      metFORMIN (GLUCOPHAGE) 500 MG tablet        Allergies  Allergen Reactions  . Ciprofloxacin Nausea And Vomiting  . Crestor [Rosuvastatin Calcium] Swelling  . Escitalopram Oxalate Other (See Comments)    Yawning and insomnia   . Lipitor [Atorvastatin Calcium] Other (See Comments)    myalgia  . Metronidazole Nausea And Vomiting  . Sulfa Antibiotics Swelling   Follow-up Information    Follow up with [facility], MD. Schedule an appointment as soon as possible for a visit in 1 week.   Specialty:  Family Medicine   Contact information:   Dr. Burnell Blanks [address] [place] [address] [phone]        The results of  significant diagnostics from this hospitalization (including imaging, microbiology, ancillary and laboratory) are listed below for reference.    Significant Diagnostic Studies: Ct Head Wo Contrast  04/14/2015   CLINICAL DATA:  Acute encephalopathy.  The patient was found lying in her bed awake with blood dripping from her posterior scalp that tracked from the bathroom on the morning of 04/13/2015 when she was found by her granddaughter. EMS was activated. In the emergency department, the patient was noted to be hypothermic. The patient was also noted to have lactic acidosis of 2.79. She was treated for presumptive sepsis and started on empiric antibiotics. F/u yesterdays scan today  EXAM: CT HEAD WITHOUT CONTRAST  TECHNIQUE: Contiguous axial images were obtained from the base of the skull through the vertex without intravenous contrast.  COMPARISON:  04/13/2015  FINDINGS: Ventricles are normal configuration. There is ventricular and sulcal enlargement reflecting moderate atrophy. No hydrocephalus.  There are no parenchymal masses or mass effect. There is no evidence of a recent cortical infarct. There is an old infarct involving the inferior right cerebellum. Small lacune infarcts noted in the left base a ganglia, left thalamus and right caudate nucleus head. Patchy white matter hypoattenuation is noted in the periventricular white matter consistent with mild to moderate chronic microvascular ischemic change.  There are no extra-axial masses or abnormal fluid collections.  There is no intracranial hemorrhage.  Visualized sinuses are clear other than minor posterior right maxillary sinus mucosal thickening.  IMPRESSION: 1. No acute intracranial abnormalities. No change from the previous day's study. 2. Atrophy, small old lacune infarcts and mild to moderate chronic microvascular ischemic change.   Electronically Signed   By: Amie Portland M.D.   On: 04/14/2015 18:49   Ct Head Wo Contrast  04/13/2015   CLINICAL  DATA:  Fall today, hematoma left posterior head, decreased level of consciousness  EXAM: CT HEAD WITHOUT CONTRAST  CT CERVICAL SPINE WITHOUT CONTRAST  TECHNIQUE: Multidetector CT imaging of the head and cervical spine was performed following the standard protocol without intravenous contrast. Multiplanar CT image reconstructions of the cervical spine were also generated.  COMPARISON:  03/23/2012  FINDINGS: CT HEAD FINDINGS  Posterior left scalp hematoma. No intracranial hemorrhage or extra-axial fluid. No evidence of vascular territory infarct. Severe atrophy and white matter disease. Probable lacunar infarct left thalamus and left basal ganglia, with a subacute to chronic appearance but not 1,013.  No skull fracture.  CT CERVICAL SPINE FINDINGS  Levoscoliosis. Severe degenerative disc disease throughout the cervical spine. Severe facet disease also present. No fracture identified. Mild anterior listhesis of C4 on C5. Mild anterior listhesis of C7 on T1.  Severe degenerative disc disease at C5-6 and C6-7.  IMPRESSION: Severe chronic change in both the head and cervical spine with no acute traumatic injury other than left scalp hematoma.   Electronically Signed   By: Esperanza Heir M.D.   On: 04/13/2015 13:10   Ct Cervical Spine Wo Contrast  04/13/2015   CLINICAL DATA:  Fall today, hematoma left posterior head, decreased level of consciousness  EXAM: CT HEAD WITHOUT CONTRAST  CT CERVICAL SPINE WITHOUT CONTRAST  TECHNIQUE: Multidetector CT imaging of the head and cervical spine was performed following the standard protocol without intravenous contrast. Multiplanar CT image reconstructions of the cervical spine were also generated.  COMPARISON:  03/23/2012  FINDINGS: CT HEAD FINDINGS  Posterior left scalp hematoma. No intracranial hemorrhage or extra-axial fluid. No evidence of vascular territory infarct. Severe atrophy and white matter disease. Probable lacunar infarct left thalamus and left basal ganglia, with a  subacute to chronic appearance but not 1,013.  No skull fracture.  CT CERVICAL SPINE FINDINGS  Levoscoliosis. Severe degenerative disc disease throughout the cervical spine. Severe facet disease also present. No fracture identified. Mild anterior listhesis of C4 on C5. Mild anterior listhesis of C7 on T1. Severe degenerative disc disease at C5-6 and C6-7.  IMPRESSION: Severe chronic change in both the head and cervical spine with no acute traumatic injury other than left scalp hematoma.   Electronically Signed   By: Esperanza Heir M.D.   On: 04/13/2015 13:10   Dg Chest Port 1 View  04/15/2015   CLINICAL DATA:  81 year old female with a history of wheezing and shortness of breath. Fall several days prior. Fever.  EXAM: PORTABLE CHEST - 1 VIEW  COMPARISON:  04/13/2015  FINDINGS: Cardiomediastinal silhouette likely unchanged, though partially obscured by overlying lung and pleural disease.  Interstitial opacities with peribronchial cuffing and interlobular septal thickening, significantly increased from the recent comparison.  Dense opacities the bilateral lung bases with obscuration the hemidiaphragms and the heart borders.  Cardiac pacing device on left chest wall unchanged with 2 leads in place.  Osteopenia.  Surgical changes of right shoulder arthroplasty.  Apices of the lungs not well evaluated secondary to overlying soft tissues of the chin.  IMPRESSION: Interval development of interstitial opacities and interlobular septal thickening extending from the bilateral hila, most concerning for edema and bilateral pleural effusions given the short interval change, however, infection difficult to exclude.  Signed,  Yvone Neu. Loreta Ave, DO  [facility]  [facility]   Electronically Signed   By: Gilmer Mor D.O.   On: 04/15/2015 13:37   Dg Chest Portable 1 View  04/13/2015   CLINICAL DATA:  Altered mental status, fall  EXAM: PORTABLE CHEST - 1 VIEW  COMPARISON:   12/15/2011  FINDINGS: Mild cardiac enlargement stable. Atherosclerotic aortic calcifications stable. Cardiac pacer stable. Bronchitic change stable. No consolidation or effusion.  IMPRESSION: Stable chronic findings with no acute process   Electronically Signed   By: Esperanza Heir M.D.   On: 04/13/2015 12:34    Microbiology: Recent Results (from the past 240 hour(s))  Culture, blood (routine x 2)     Status: None (Preliminary result)   Collection Time: 04/13/15 11:58 AM  Result Value Ref Range Status   Specimen Description BLOOD LEFT ANTECUBITAL  Final   Special Requests BOTTLES DRAWN AEROBIC ONLY 4CC  Final   Culture  Setup Time   Final    GRAM NEGATIVE RODS AEROBIC BOTTLE ONLY CRITICAL RESULT CALLED TO, READ BACK  BY AND VERIFIED WITH: M. SNOW,RN AT 0734 ON [id number] BY Lucienne Capers    Culture   Final    Romie Minus NEGATIVE RODS CULTURE REINCUBATED FOR BETTER GROWTH    Report Status PENDING  Incomplete  Culture, blood (routine x 2)     Status: None   Collection Time: 04/13/15 12:50 PM  Result Value Ref Range Status   Specimen Description BLOOD LEFT WRIST  Final   Special Requests BOTTLES DRAWN AEROBIC ONLY 3CC  Final   Culture NO GROWTH 5 DAYS  Final   Report Status 04/18/2015 FINAL  Final  Gram stain     Status: None   Collection Time: 04/13/15  3:39 PM  Result Value Ref Range Status   Specimen Description URINE, CATHETERIZED  Final   Special Requests Normal  Final   Gram Stain   Final    CYTOSPIN SMEAR WBC PRESENT,BOTH PMN AND MONONUCLEAR NO ORGANISMS SEEN    Report Status 04/13/2015 FINAL  Final  MRSA PCR Screening     Status: Abnormal   Collection Time: 04/13/15  6:25 PM  Result Value Ref Range Status   MRSA by PCR POSITIVE (A) NEGATIVE Final    Comment:        The GeneXpert MRSA Assay (FDA approved for NASAL specimens only), is one component of a comprehensive MRSA colonization surveillance program. It is not intended to diagnose MRSA infection nor to guide or monitor  treatment for MRSA infections. RESULT CALLED TO, READ BACK BY AND VERIFIED WITH: S.STOWE,RN AT 2124 BY L.PITT 04/13/15      Labs: Basic Metabolic Panel:  Recent Labs Lab 04/14/15 0558 04/15/15 0315 04/16/15 0258 04/17/15 0504 04/18/15 0405  NA 136 140 132* 137 139  K 4.1 4.1 3.2* 3.1* 3.6  CL 109 112* 98* 100* 104  CO2 21* 22 24 27 28   GLUCOSE 152* 138* 177* 145* 140*  BUN 27* 20 13 12 11   CREATININE 1.28* 1.08* 1.13* 1.20* 1.13*  CALCIUM 7.4* 7.9* 7.8* 8.1* 8.2*  MG  --   --   --   --  1.7   Liver Function Tests:  Recent Labs Lab 04/13/15 1209 04/13/15 1250  AST 28 19  ALT 8* 9*  ALKPHOS 48 46  BILITOT 1.0 0.9  PROT 4.7* 5.1*  ALBUMIN 2.7* 2.5*   No results for input(s): LIPASE, AMYLASE in the last 168 hours.  Recent Labs Lab 04/14/15 1710  AMMONIA <9*   CBC:  Recent Labs Lab 04/14/15 0558 04/14/15 1710 04/15/15 0315 04/16/15 0258 04/17/15 0504 04/18/15 0405  WBC 14.1*  --  18.0* 12.3* 14.2* 12.8*  HGB 7.8*  --  7.3* 13.6 11.1* 11.1*  HCT 23.6* 22.1* 22.4* 40.7 33.9* 34.4*  MCV 97.9  --  97.8 89.8 90.9 93.2  PLT 293  --  296 198 288 286   Cardiac Enzymes:  Recent Labs Lab 04/13/15 1209  CKTOTAL 57   BNP: BNP (last 3 results)  Recent Labs  04/16/15 0258  BNP 1084.1*    ProBNP (last 3 results) No results for input(s): PROBNP in the last 8760 hours.  CBG:  Recent Labs Lab 04/13/15 1201 04/13/15 2213 04/14/15 0717  GLUCAP 250* 179* 159*       Signed:  JOSEPH,PREETHA  [facility] 04/18/2015, 1:36 PM

## 2015-04-18 NOTE — Care Management Important Message (Signed)
Important Message  Patient Details  Name: Lynn Weeks MRN: [id number] Date of Birth: 06/25/1919   Medicare Important Message Given:  Yes-second notification given    Gala Lewandowsky, RN 04/18/2015, 11:00 AM

## 2015-04-19 DIAGNOSIS — H109 Unspecified conjunctivitis: Secondary | ICD-10-CM | POA: Diagnosis not present

## 2015-04-19 DIAGNOSIS — S0083XD Contusion of other part of head, subsequent encounter: Secondary | ICD-10-CM | POA: Diagnosis not present

## 2015-04-23 LAB — CULTURE, BLOOD (ROUTINE X 2)
CULTURE: NO GROWTH
Culture: NO GROWTH

## 2015-04-25 DIAGNOSIS — I5032 Chronic diastolic (congestive) heart failure: Secondary | ICD-10-CM | POA: Diagnosis not present

## 2015-04-25 DIAGNOSIS — R7881 Bacteremia: Secondary | ICD-10-CM | POA: Diagnosis not present

## 2015-04-25 DIAGNOSIS — G301 Alzheimer's disease with late onset: Secondary | ICD-10-CM | POA: Diagnosis not present

## 2015-04-25 DIAGNOSIS — D508 Other iron deficiency anemias: Secondary | ICD-10-CM | POA: Diagnosis not present

## 2015-05-10 DIAGNOSIS — H04123 Dry eye syndrome of bilateral lacrimal glands: Secondary | ICD-10-CM | POA: Diagnosis not present

## 2015-05-10 DIAGNOSIS — E1165 Type 2 diabetes mellitus with hyperglycemia: Secondary | ICD-10-CM | POA: Diagnosis not present

## 2015-05-17 DIAGNOSIS — E1165 Type 2 diabetes mellitus with hyperglycemia: Secondary | ICD-10-CM | POA: Diagnosis not present

## 2015-05-17 DIAGNOSIS — I129 Hypertensive chronic kidney disease with stage 1 through stage 4 chronic kidney disease, or unspecified chronic kidney disease: Secondary | ICD-10-CM | POA: Diagnosis not present

## 2015-05-17 DIAGNOSIS — D649 Anemia, unspecified: Secondary | ICD-10-CM | POA: Diagnosis not present

## 2015-05-17 DIAGNOSIS — F0391 Unspecified dementia with behavioral disturbance: Secondary | ICD-10-CM | POA: Diagnosis not present

## 2015-05-28 LAB — CULTURE, BLOOD (ROUTINE X 2)

## 2015-05-30 DIAGNOSIS — N183 Chronic kidney disease, stage 3 (moderate): Secondary | ICD-10-CM | POA: Diagnosis not present

## 2015-05-30 DIAGNOSIS — G9349 Other encephalopathy: Secondary | ICD-10-CM | POA: Diagnosis not present

## 2015-05-30 DIAGNOSIS — Z9181 History of falling: Secondary | ICD-10-CM | POA: Diagnosis not present

## 2015-05-30 DIAGNOSIS — F0391 Unspecified dementia with behavioral disturbance: Secondary | ICD-10-CM | POA: Diagnosis not present

## 2015-05-30 DIAGNOSIS — I5032 Chronic diastolic (congestive) heart failure: Secondary | ICD-10-CM | POA: Diagnosis not present

## 2015-05-30 DIAGNOSIS — E119 Type 2 diabetes mellitus without complications: Secondary | ICD-10-CM | POA: Diagnosis not present

## 2015-05-30 DIAGNOSIS — D649 Anemia, unspecified: Secondary | ICD-10-CM | POA: Diagnosis not present

## 2015-05-31 DIAGNOSIS — M6281 Muscle weakness (generalized): Secondary | ICD-10-CM | POA: Diagnosis not present

## 2015-05-31 DIAGNOSIS — R296 Repeated falls: Secondary | ICD-10-CM | POA: Diagnosis not present

## 2015-05-31 DIAGNOSIS — R2689 Other abnormalities of gait and mobility: Secondary | ICD-10-CM | POA: Diagnosis not present

## 2015-05-31 DIAGNOSIS — R278 Other lack of coordination: Secondary | ICD-10-CM | POA: Diagnosis not present

## 2015-06-28 DIAGNOSIS — R296 Repeated falls: Secondary | ICD-10-CM | POA: Diagnosis not present

## 2015-06-28 DIAGNOSIS — R278 Other lack of coordination: Secondary | ICD-10-CM | POA: Diagnosis not present

## 2015-06-28 DIAGNOSIS — M62111 Other rupture of muscle (nontraumatic), right shoulder: Secondary | ICD-10-CM | POA: Diagnosis not present

## 2015-06-28 DIAGNOSIS — F0390 Unspecified dementia without behavioral disturbance: Secondary | ICD-10-CM | POA: Diagnosis not present

## 2015-06-28 DIAGNOSIS — M6281 Muscle weakness (generalized): Secondary | ICD-10-CM | POA: Diagnosis not present

## 2015-06-28 DIAGNOSIS — R2689 Other abnormalities of gait and mobility: Secondary | ICD-10-CM | POA: Diagnosis not present

## 2015-06-28 DIAGNOSIS — R488 Other symbolic dysfunctions: Secondary | ICD-10-CM | POA: Diagnosis not present

## 2015-06-29 DIAGNOSIS — R488 Other symbolic dysfunctions: Secondary | ICD-10-CM | POA: Diagnosis not present

## 2015-06-29 DIAGNOSIS — M6281 Muscle weakness (generalized): Secondary | ICD-10-CM | POA: Diagnosis not present

## 2015-06-29 DIAGNOSIS — R296 Repeated falls: Secondary | ICD-10-CM | POA: Diagnosis not present

## 2015-06-29 DIAGNOSIS — M62111 Other rupture of muscle (nontraumatic), right shoulder: Secondary | ICD-10-CM | POA: Diagnosis not present

## 2015-06-29 DIAGNOSIS — R278 Other lack of coordination: Secondary | ICD-10-CM | POA: Diagnosis not present

## 2015-06-29 DIAGNOSIS — R2689 Other abnormalities of gait and mobility: Secondary | ICD-10-CM | POA: Diagnosis not present

## 2015-07-02 DIAGNOSIS — R296 Repeated falls: Secondary | ICD-10-CM | POA: Diagnosis not present

## 2015-07-02 DIAGNOSIS — M62111 Other rupture of muscle (nontraumatic), right shoulder: Secondary | ICD-10-CM | POA: Diagnosis not present

## 2015-07-02 DIAGNOSIS — M6281 Muscle weakness (generalized): Secondary | ICD-10-CM | POA: Diagnosis not present

## 2015-07-02 DIAGNOSIS — R2689 Other abnormalities of gait and mobility: Secondary | ICD-10-CM | POA: Diagnosis not present

## 2015-07-02 DIAGNOSIS — R278 Other lack of coordination: Secondary | ICD-10-CM | POA: Diagnosis not present

## 2015-07-02 DIAGNOSIS — R488 Other symbolic dysfunctions: Secondary | ICD-10-CM | POA: Diagnosis not present

## 2015-07-03 DIAGNOSIS — R296 Repeated falls: Secondary | ICD-10-CM | POA: Diagnosis not present

## 2015-07-03 DIAGNOSIS — M62111 Other rupture of muscle (nontraumatic), right shoulder: Secondary | ICD-10-CM | POA: Diagnosis not present

## 2015-07-03 DIAGNOSIS — R278 Other lack of coordination: Secondary | ICD-10-CM | POA: Diagnosis not present

## 2015-07-03 DIAGNOSIS — R488 Other symbolic dysfunctions: Secondary | ICD-10-CM | POA: Diagnosis not present

## 2015-07-03 DIAGNOSIS — R2689 Other abnormalities of gait and mobility: Secondary | ICD-10-CM | POA: Diagnosis not present

## 2015-07-03 DIAGNOSIS — M6281 Muscle weakness (generalized): Secondary | ICD-10-CM | POA: Diagnosis not present

## 2015-07-04 DIAGNOSIS — M6281 Muscle weakness (generalized): Secondary | ICD-10-CM | POA: Diagnosis not present

## 2015-07-04 DIAGNOSIS — R296 Repeated falls: Secondary | ICD-10-CM | POA: Diagnosis not present

## 2015-07-04 DIAGNOSIS — M62111 Other rupture of muscle (nontraumatic), right shoulder: Secondary | ICD-10-CM | POA: Diagnosis not present

## 2015-07-04 DIAGNOSIS — R2689 Other abnormalities of gait and mobility: Secondary | ICD-10-CM | POA: Diagnosis not present

## 2015-07-04 DIAGNOSIS — R488 Other symbolic dysfunctions: Secondary | ICD-10-CM | POA: Diagnosis not present

## 2015-07-04 DIAGNOSIS — R278 Other lack of coordination: Secondary | ICD-10-CM | POA: Diagnosis not present

## 2015-07-05 DIAGNOSIS — M6281 Muscle weakness (generalized): Secondary | ICD-10-CM | POA: Diagnosis not present

## 2015-07-05 DIAGNOSIS — M62111 Other rupture of muscle (nontraumatic), right shoulder: Secondary | ICD-10-CM | POA: Diagnosis not present

## 2015-07-05 DIAGNOSIS — R278 Other lack of coordination: Secondary | ICD-10-CM | POA: Diagnosis not present

## 2015-07-05 DIAGNOSIS — R296 Repeated falls: Secondary | ICD-10-CM | POA: Diagnosis not present

## 2015-07-05 DIAGNOSIS — R488 Other symbolic dysfunctions: Secondary | ICD-10-CM | POA: Diagnosis not present

## 2015-07-05 DIAGNOSIS — R2689 Other abnormalities of gait and mobility: Secondary | ICD-10-CM | POA: Diagnosis not present

## 2015-07-06 DIAGNOSIS — R296 Repeated falls: Secondary | ICD-10-CM | POA: Diagnosis not present

## 2015-07-06 DIAGNOSIS — R488 Other symbolic dysfunctions: Secondary | ICD-10-CM | POA: Diagnosis not present

## 2015-07-06 DIAGNOSIS — R2689 Other abnormalities of gait and mobility: Secondary | ICD-10-CM | POA: Diagnosis not present

## 2015-07-06 DIAGNOSIS — R278 Other lack of coordination: Secondary | ICD-10-CM | POA: Diagnosis not present

## 2015-07-06 DIAGNOSIS — M6281 Muscle weakness (generalized): Secondary | ICD-10-CM | POA: Diagnosis not present

## 2015-07-06 DIAGNOSIS — M62111 Other rupture of muscle (nontraumatic), right shoulder: Secondary | ICD-10-CM | POA: Diagnosis not present

## 2015-07-07 DIAGNOSIS — M6281 Muscle weakness (generalized): Secondary | ICD-10-CM | POA: Diagnosis not present

## 2015-07-07 DIAGNOSIS — R278 Other lack of coordination: Secondary | ICD-10-CM | POA: Diagnosis not present

## 2015-07-07 DIAGNOSIS — R296 Repeated falls: Secondary | ICD-10-CM | POA: Diagnosis not present

## 2015-07-07 DIAGNOSIS — M62111 Other rupture of muscle (nontraumatic), right shoulder: Secondary | ICD-10-CM | POA: Diagnosis not present

## 2015-07-07 DIAGNOSIS — R488 Other symbolic dysfunctions: Secondary | ICD-10-CM | POA: Diagnosis not present

## 2015-07-07 DIAGNOSIS — R2689 Other abnormalities of gait and mobility: Secondary | ICD-10-CM | POA: Diagnosis not present

## 2015-07-09 DIAGNOSIS — M62111 Other rupture of muscle (nontraumatic), right shoulder: Secondary | ICD-10-CM | POA: Diagnosis not present

## 2015-07-09 DIAGNOSIS — R2689 Other abnormalities of gait and mobility: Secondary | ICD-10-CM | POA: Diagnosis not present

## 2015-07-09 DIAGNOSIS — M6281 Muscle weakness (generalized): Secondary | ICD-10-CM | POA: Diagnosis not present

## 2015-07-09 DIAGNOSIS — R278 Other lack of coordination: Secondary | ICD-10-CM | POA: Diagnosis not present

## 2015-07-09 DIAGNOSIS — R488 Other symbolic dysfunctions: Secondary | ICD-10-CM | POA: Diagnosis not present

## 2015-07-09 DIAGNOSIS — R296 Repeated falls: Secondary | ICD-10-CM | POA: Diagnosis not present

## 2015-07-10 DIAGNOSIS — R488 Other symbolic dysfunctions: Secondary | ICD-10-CM | POA: Diagnosis not present

## 2015-07-10 DIAGNOSIS — R278 Other lack of coordination: Secondary | ICD-10-CM | POA: Diagnosis not present

## 2015-07-10 DIAGNOSIS — R2689 Other abnormalities of gait and mobility: Secondary | ICD-10-CM | POA: Diagnosis not present

## 2015-07-10 DIAGNOSIS — M6281 Muscle weakness (generalized): Secondary | ICD-10-CM | POA: Diagnosis not present

## 2015-07-10 DIAGNOSIS — R296 Repeated falls: Secondary | ICD-10-CM | POA: Diagnosis not present

## 2015-07-10 DIAGNOSIS — M62111 Other rupture of muscle (nontraumatic), right shoulder: Secondary | ICD-10-CM | POA: Diagnosis not present

## 2015-07-11 DIAGNOSIS — M62111 Other rupture of muscle (nontraumatic), right shoulder: Secondary | ICD-10-CM | POA: Diagnosis not present

## 2015-07-11 DIAGNOSIS — R278 Other lack of coordination: Secondary | ICD-10-CM | POA: Diagnosis not present

## 2015-07-11 DIAGNOSIS — R2689 Other abnormalities of gait and mobility: Secondary | ICD-10-CM | POA: Diagnosis not present

## 2015-07-11 DIAGNOSIS — M6281 Muscle weakness (generalized): Secondary | ICD-10-CM | POA: Diagnosis not present

## 2015-07-11 DIAGNOSIS — R296 Repeated falls: Secondary | ICD-10-CM | POA: Diagnosis not present

## 2015-07-11 DIAGNOSIS — R488 Other symbolic dysfunctions: Secondary | ICD-10-CM | POA: Diagnosis not present

## 2015-07-12 DIAGNOSIS — M62111 Other rupture of muscle (nontraumatic), right shoulder: Secondary | ICD-10-CM | POA: Diagnosis not present

## 2015-07-12 DIAGNOSIS — R2689 Other abnormalities of gait and mobility: Secondary | ICD-10-CM | POA: Diagnosis not present

## 2015-07-12 DIAGNOSIS — R296 Repeated falls: Secondary | ICD-10-CM | POA: Diagnosis not present

## 2015-07-12 DIAGNOSIS — R278 Other lack of coordination: Secondary | ICD-10-CM | POA: Diagnosis not present

## 2015-07-12 DIAGNOSIS — M6281 Muscle weakness (generalized): Secondary | ICD-10-CM | POA: Diagnosis not present

## 2015-07-12 DIAGNOSIS — R488 Other symbolic dysfunctions: Secondary | ICD-10-CM | POA: Diagnosis not present

## 2015-07-13 DIAGNOSIS — R278 Other lack of coordination: Secondary | ICD-10-CM | POA: Diagnosis not present

## 2015-07-13 DIAGNOSIS — M6281 Muscle weakness (generalized): Secondary | ICD-10-CM | POA: Diagnosis not present

## 2015-07-13 DIAGNOSIS — R2689 Other abnormalities of gait and mobility: Secondary | ICD-10-CM | POA: Diagnosis not present

## 2015-07-13 DIAGNOSIS — R488 Other symbolic dysfunctions: Secondary | ICD-10-CM | POA: Diagnosis not present

## 2015-07-13 DIAGNOSIS — M62111 Other rupture of muscle (nontraumatic), right shoulder: Secondary | ICD-10-CM | POA: Diagnosis not present

## 2015-07-13 DIAGNOSIS — R296 Repeated falls: Secondary | ICD-10-CM | POA: Diagnosis not present

## 2015-07-16 DIAGNOSIS — M62111 Other rupture of muscle (nontraumatic), right shoulder: Secondary | ICD-10-CM | POA: Diagnosis not present

## 2015-07-16 DIAGNOSIS — M6281 Muscle weakness (generalized): Secondary | ICD-10-CM | POA: Diagnosis not present

## 2015-07-16 DIAGNOSIS — R488 Other symbolic dysfunctions: Secondary | ICD-10-CM | POA: Diagnosis not present

## 2015-07-16 DIAGNOSIS — R2689 Other abnormalities of gait and mobility: Secondary | ICD-10-CM | POA: Diagnosis not present

## 2015-07-16 DIAGNOSIS — R278 Other lack of coordination: Secondary | ICD-10-CM | POA: Diagnosis not present

## 2015-07-16 DIAGNOSIS — R296 Repeated falls: Secondary | ICD-10-CM | POA: Diagnosis not present

## 2015-07-17 DIAGNOSIS — M6281 Muscle weakness (generalized): Secondary | ICD-10-CM | POA: Diagnosis not present

## 2015-07-17 DIAGNOSIS — R278 Other lack of coordination: Secondary | ICD-10-CM | POA: Diagnosis not present

## 2015-07-17 DIAGNOSIS — R296 Repeated falls: Secondary | ICD-10-CM | POA: Diagnosis not present

## 2015-07-17 DIAGNOSIS — R2689 Other abnormalities of gait and mobility: Secondary | ICD-10-CM | POA: Diagnosis not present

## 2015-07-17 DIAGNOSIS — M62111 Other rupture of muscle (nontraumatic), right shoulder: Secondary | ICD-10-CM | POA: Diagnosis not present

## 2015-07-17 DIAGNOSIS — R488 Other symbolic dysfunctions: Secondary | ICD-10-CM | POA: Diagnosis not present

## 2015-07-18 DIAGNOSIS — M6281 Muscle weakness (generalized): Secondary | ICD-10-CM | POA: Diagnosis not present

## 2015-07-18 DIAGNOSIS — M62111 Other rupture of muscle (nontraumatic), right shoulder: Secondary | ICD-10-CM | POA: Diagnosis not present

## 2015-07-18 DIAGNOSIS — R488 Other symbolic dysfunctions: Secondary | ICD-10-CM | POA: Diagnosis not present

## 2015-07-18 DIAGNOSIS — R296 Repeated falls: Secondary | ICD-10-CM | POA: Diagnosis not present

## 2015-07-18 DIAGNOSIS — R278 Other lack of coordination: Secondary | ICD-10-CM | POA: Diagnosis not present

## 2015-07-18 DIAGNOSIS — R2689 Other abnormalities of gait and mobility: Secondary | ICD-10-CM | POA: Diagnosis not present

## 2015-07-19 DIAGNOSIS — R488 Other symbolic dysfunctions: Secondary | ICD-10-CM | POA: Diagnosis not present

## 2015-07-19 DIAGNOSIS — R278 Other lack of coordination: Secondary | ICD-10-CM | POA: Diagnosis not present

## 2015-07-19 DIAGNOSIS — R2689 Other abnormalities of gait and mobility: Secondary | ICD-10-CM | POA: Diagnosis not present

## 2015-07-19 DIAGNOSIS — R296 Repeated falls: Secondary | ICD-10-CM | POA: Diagnosis not present

## 2015-07-19 DIAGNOSIS — M6281 Muscle weakness (generalized): Secondary | ICD-10-CM | POA: Diagnosis not present

## 2015-07-19 DIAGNOSIS — M62111 Other rupture of muscle (nontraumatic), right shoulder: Secondary | ICD-10-CM | POA: Diagnosis not present

## 2015-07-20 DIAGNOSIS — R278 Other lack of coordination: Secondary | ICD-10-CM | POA: Diagnosis not present

## 2015-07-20 DIAGNOSIS — R2689 Other abnormalities of gait and mobility: Secondary | ICD-10-CM | POA: Diagnosis not present

## 2015-07-20 DIAGNOSIS — M62111 Other rupture of muscle (nontraumatic), right shoulder: Secondary | ICD-10-CM | POA: Diagnosis not present

## 2015-07-20 DIAGNOSIS — R488 Other symbolic dysfunctions: Secondary | ICD-10-CM | POA: Diagnosis not present

## 2015-07-20 DIAGNOSIS — R296 Repeated falls: Secondary | ICD-10-CM | POA: Diagnosis not present

## 2015-07-20 DIAGNOSIS — M6281 Muscle weakness (generalized): Secondary | ICD-10-CM | POA: Diagnosis not present

## 2015-07-23 DIAGNOSIS — R296 Repeated falls: Secondary | ICD-10-CM | POA: Diagnosis not present

## 2015-07-23 DIAGNOSIS — M62111 Other rupture of muscle (nontraumatic), right shoulder: Secondary | ICD-10-CM | POA: Diagnosis not present

## 2015-07-23 DIAGNOSIS — R278 Other lack of coordination: Secondary | ICD-10-CM | POA: Diagnosis not present

## 2015-07-23 DIAGNOSIS — R2689 Other abnormalities of gait and mobility: Secondary | ICD-10-CM | POA: Diagnosis not present

## 2015-07-23 DIAGNOSIS — R488 Other symbolic dysfunctions: Secondary | ICD-10-CM | POA: Diagnosis not present

## 2015-07-23 DIAGNOSIS — M6281 Muscle weakness (generalized): Secondary | ICD-10-CM | POA: Diagnosis not present

## 2015-07-24 DIAGNOSIS — M62111 Other rupture of muscle (nontraumatic), right shoulder: Secondary | ICD-10-CM | POA: Diagnosis not present

## 2015-07-24 DIAGNOSIS — M6281 Muscle weakness (generalized): Secondary | ICD-10-CM | POA: Diagnosis not present

## 2015-07-24 DIAGNOSIS — R488 Other symbolic dysfunctions: Secondary | ICD-10-CM | POA: Diagnosis not present

## 2015-07-24 DIAGNOSIS — R278 Other lack of coordination: Secondary | ICD-10-CM | POA: Diagnosis not present

## 2015-07-24 DIAGNOSIS — R296 Repeated falls: Secondary | ICD-10-CM | POA: Diagnosis not present

## 2015-07-24 DIAGNOSIS — R2689 Other abnormalities of gait and mobility: Secondary | ICD-10-CM | POA: Diagnosis not present

## 2015-07-25 DIAGNOSIS — R488 Other symbolic dysfunctions: Secondary | ICD-10-CM | POA: Diagnosis not present

## 2015-07-25 DIAGNOSIS — M6281 Muscle weakness (generalized): Secondary | ICD-10-CM | POA: Diagnosis not present

## 2015-07-25 DIAGNOSIS — R278 Other lack of coordination: Secondary | ICD-10-CM | POA: Diagnosis not present

## 2015-07-25 DIAGNOSIS — R2689 Other abnormalities of gait and mobility: Secondary | ICD-10-CM | POA: Diagnosis not present

## 2015-07-25 DIAGNOSIS — M62111 Other rupture of muscle (nontraumatic), right shoulder: Secondary | ICD-10-CM | POA: Diagnosis not present

## 2015-07-25 DIAGNOSIS — R296 Repeated falls: Secondary | ICD-10-CM | POA: Diagnosis not present

## 2015-07-26 DIAGNOSIS — R2689 Other abnormalities of gait and mobility: Secondary | ICD-10-CM | POA: Diagnosis not present

## 2015-07-26 DIAGNOSIS — M6281 Muscle weakness (generalized): Secondary | ICD-10-CM | POA: Diagnosis not present

## 2015-07-26 DIAGNOSIS — R278 Other lack of coordination: Secondary | ICD-10-CM | POA: Diagnosis not present

## 2015-07-26 DIAGNOSIS — R488 Other symbolic dysfunctions: Secondary | ICD-10-CM | POA: Diagnosis not present

## 2015-07-26 DIAGNOSIS — M62111 Other rupture of muscle (nontraumatic), right shoulder: Secondary | ICD-10-CM | POA: Diagnosis not present

## 2015-07-26 DIAGNOSIS — R296 Repeated falls: Secondary | ICD-10-CM | POA: Diagnosis not present

## 2015-07-27 DIAGNOSIS — R488 Other symbolic dysfunctions: Secondary | ICD-10-CM | POA: Diagnosis not present

## 2015-07-27 DIAGNOSIS — M6281 Muscle weakness (generalized): Secondary | ICD-10-CM | POA: Diagnosis not present

## 2015-07-27 DIAGNOSIS — R2689 Other abnormalities of gait and mobility: Secondary | ICD-10-CM | POA: Diagnosis not present

## 2015-07-27 DIAGNOSIS — R296 Repeated falls: Secondary | ICD-10-CM | POA: Diagnosis not present

## 2015-07-27 DIAGNOSIS — M62111 Other rupture of muscle (nontraumatic), right shoulder: Secondary | ICD-10-CM | POA: Diagnosis not present

## 2015-07-27 DIAGNOSIS — R278 Other lack of coordination: Secondary | ICD-10-CM | POA: Diagnosis not present

## 2015-07-30 DIAGNOSIS — R296 Repeated falls: Secondary | ICD-10-CM | POA: Diagnosis not present

## 2015-07-30 DIAGNOSIS — R2689 Other abnormalities of gait and mobility: Secondary | ICD-10-CM | POA: Diagnosis not present

## 2015-07-30 DIAGNOSIS — R278 Other lack of coordination: Secondary | ICD-10-CM | POA: Diagnosis not present

## 2015-07-30 DIAGNOSIS — F0390 Unspecified dementia without behavioral disturbance: Secondary | ICD-10-CM | POA: Diagnosis not present

## 2015-07-30 DIAGNOSIS — M62111 Other rupture of muscle (nontraumatic), right shoulder: Secondary | ICD-10-CM | POA: Diagnosis not present

## 2015-07-30 DIAGNOSIS — H5410 Blindness, one eye, low vision other eye, unspecified eyes: Secondary | ICD-10-CM | POA: Diagnosis not present

## 2015-07-30 DIAGNOSIS — M6281 Muscle weakness (generalized): Secondary | ICD-10-CM | POA: Diagnosis not present

## 2015-07-30 DIAGNOSIS — R488 Other symbolic dysfunctions: Secondary | ICD-10-CM | POA: Diagnosis not present

## 2015-07-31 DIAGNOSIS — H5410 Blindness, one eye, low vision other eye, unspecified eyes: Secondary | ICD-10-CM | POA: Diagnosis not present

## 2015-07-31 DIAGNOSIS — R488 Other symbolic dysfunctions: Secondary | ICD-10-CM | POA: Diagnosis not present

## 2015-07-31 DIAGNOSIS — R2689 Other abnormalities of gait and mobility: Secondary | ICD-10-CM | POA: Diagnosis not present

## 2015-07-31 DIAGNOSIS — F0390 Unspecified dementia without behavioral disturbance: Secondary | ICD-10-CM | POA: Diagnosis not present

## 2015-07-31 DIAGNOSIS — M62111 Other rupture of muscle (nontraumatic), right shoulder: Secondary | ICD-10-CM | POA: Diagnosis not present

## 2015-07-31 DIAGNOSIS — R278 Other lack of coordination: Secondary | ICD-10-CM | POA: Diagnosis not present

## 2015-08-01 DIAGNOSIS — F0390 Unspecified dementia without behavioral disturbance: Secondary | ICD-10-CM | POA: Diagnosis not present

## 2015-08-01 DIAGNOSIS — R278 Other lack of coordination: Secondary | ICD-10-CM | POA: Diagnosis not present

## 2015-08-01 DIAGNOSIS — R2689 Other abnormalities of gait and mobility: Secondary | ICD-10-CM | POA: Diagnosis not present

## 2015-08-01 DIAGNOSIS — R488 Other symbolic dysfunctions: Secondary | ICD-10-CM | POA: Diagnosis not present

## 2015-08-01 DIAGNOSIS — H5410 Blindness, one eye, low vision other eye, unspecified eyes: Secondary | ICD-10-CM | POA: Diagnosis not present

## 2015-08-01 DIAGNOSIS — M62111 Other rupture of muscle (nontraumatic), right shoulder: Secondary | ICD-10-CM | POA: Diagnosis not present

## 2015-08-02 DIAGNOSIS — R278 Other lack of coordination: Secondary | ICD-10-CM | POA: Diagnosis not present

## 2015-08-02 DIAGNOSIS — M62111 Other rupture of muscle (nontraumatic), right shoulder: Secondary | ICD-10-CM | POA: Diagnosis not present

## 2015-08-02 DIAGNOSIS — H5410 Blindness, one eye, low vision other eye, unspecified eyes: Secondary | ICD-10-CM | POA: Diagnosis not present

## 2015-08-02 DIAGNOSIS — F0390 Unspecified dementia without behavioral disturbance: Secondary | ICD-10-CM | POA: Diagnosis not present

## 2015-08-02 DIAGNOSIS — R488 Other symbolic dysfunctions: Secondary | ICD-10-CM | POA: Diagnosis not present

## 2015-08-02 DIAGNOSIS — R2689 Other abnormalities of gait and mobility: Secondary | ICD-10-CM | POA: Diagnosis not present

## 2015-08-03 DIAGNOSIS — R278 Other lack of coordination: Secondary | ICD-10-CM | POA: Diagnosis not present

## 2015-08-03 DIAGNOSIS — F0390 Unspecified dementia without behavioral disturbance: Secondary | ICD-10-CM | POA: Diagnosis not present

## 2015-08-03 DIAGNOSIS — R488 Other symbolic dysfunctions: Secondary | ICD-10-CM | POA: Diagnosis not present

## 2015-08-03 DIAGNOSIS — H5410 Blindness, one eye, low vision other eye, unspecified eyes: Secondary | ICD-10-CM | POA: Diagnosis not present

## 2015-08-03 DIAGNOSIS — M62111 Other rupture of muscle (nontraumatic), right shoulder: Secondary | ICD-10-CM | POA: Diagnosis not present

## 2015-08-03 DIAGNOSIS — R2689 Other abnormalities of gait and mobility: Secondary | ICD-10-CM | POA: Diagnosis not present

## 2015-08-06 DIAGNOSIS — M62111 Other rupture of muscle (nontraumatic), right shoulder: Secondary | ICD-10-CM | POA: Diagnosis not present

## 2015-08-06 DIAGNOSIS — F0390 Unspecified dementia without behavioral disturbance: Secondary | ICD-10-CM | POA: Diagnosis not present

## 2015-08-06 DIAGNOSIS — H5410 Blindness, one eye, low vision other eye, unspecified eyes: Secondary | ICD-10-CM | POA: Diagnosis not present

## 2015-08-06 DIAGNOSIS — R488 Other symbolic dysfunctions: Secondary | ICD-10-CM | POA: Diagnosis not present

## 2015-08-06 DIAGNOSIS — R2689 Other abnormalities of gait and mobility: Secondary | ICD-10-CM | POA: Diagnosis not present

## 2015-08-06 DIAGNOSIS — R278 Other lack of coordination: Secondary | ICD-10-CM | POA: Diagnosis not present

## 2015-08-07 DIAGNOSIS — F0390 Unspecified dementia without behavioral disturbance: Secondary | ICD-10-CM | POA: Diagnosis not present

## 2015-08-07 DIAGNOSIS — R488 Other symbolic dysfunctions: Secondary | ICD-10-CM | POA: Diagnosis not present

## 2015-08-07 DIAGNOSIS — R2689 Other abnormalities of gait and mobility: Secondary | ICD-10-CM | POA: Diagnosis not present

## 2015-08-07 DIAGNOSIS — R278 Other lack of coordination: Secondary | ICD-10-CM | POA: Diagnosis not present

## 2015-08-07 DIAGNOSIS — M62111 Other rupture of muscle (nontraumatic), right shoulder: Secondary | ICD-10-CM | POA: Diagnosis not present

## 2015-08-07 DIAGNOSIS — H5410 Blindness, one eye, low vision other eye, unspecified eyes: Secondary | ICD-10-CM | POA: Diagnosis not present

## 2015-08-08 DIAGNOSIS — H5410 Blindness, one eye, low vision other eye, unspecified eyes: Secondary | ICD-10-CM | POA: Diagnosis not present

## 2015-08-08 DIAGNOSIS — R278 Other lack of coordination: Secondary | ICD-10-CM | POA: Diagnosis not present

## 2015-08-08 DIAGNOSIS — R2689 Other abnormalities of gait and mobility: Secondary | ICD-10-CM | POA: Diagnosis not present

## 2015-08-08 DIAGNOSIS — F0390 Unspecified dementia without behavioral disturbance: Secondary | ICD-10-CM | POA: Diagnosis not present

## 2015-08-08 DIAGNOSIS — M62111 Other rupture of muscle (nontraumatic), right shoulder: Secondary | ICD-10-CM | POA: Diagnosis not present

## 2015-08-08 DIAGNOSIS — R488 Other symbolic dysfunctions: Secondary | ICD-10-CM | POA: Diagnosis not present

## 2015-08-09 DIAGNOSIS — H5410 Blindness, one eye, low vision other eye, unspecified eyes: Secondary | ICD-10-CM | POA: Diagnosis not present

## 2015-08-09 DIAGNOSIS — R488 Other symbolic dysfunctions: Secondary | ICD-10-CM | POA: Diagnosis not present

## 2015-08-09 DIAGNOSIS — R278 Other lack of coordination: Secondary | ICD-10-CM | POA: Diagnosis not present

## 2015-08-09 DIAGNOSIS — M62111 Other rupture of muscle (nontraumatic), right shoulder: Secondary | ICD-10-CM | POA: Diagnosis not present

## 2015-08-09 DIAGNOSIS — F0390 Unspecified dementia without behavioral disturbance: Secondary | ICD-10-CM | POA: Diagnosis not present

## 2015-08-09 DIAGNOSIS — R2689 Other abnormalities of gait and mobility: Secondary | ICD-10-CM | POA: Diagnosis not present

## 2015-08-10 DIAGNOSIS — M62111 Other rupture of muscle (nontraumatic), right shoulder: Secondary | ICD-10-CM | POA: Diagnosis not present

## 2015-08-10 DIAGNOSIS — H5410 Blindness, one eye, low vision other eye, unspecified eyes: Secondary | ICD-10-CM | POA: Diagnosis not present

## 2015-08-10 DIAGNOSIS — R278 Other lack of coordination: Secondary | ICD-10-CM | POA: Diagnosis not present

## 2015-08-10 DIAGNOSIS — R2689 Other abnormalities of gait and mobility: Secondary | ICD-10-CM | POA: Diagnosis not present

## 2015-08-10 DIAGNOSIS — R488 Other symbolic dysfunctions: Secondary | ICD-10-CM | POA: Diagnosis not present

## 2015-08-10 DIAGNOSIS — F0390 Unspecified dementia without behavioral disturbance: Secondary | ICD-10-CM | POA: Diagnosis not present

## 2015-08-11 DIAGNOSIS — H5410 Blindness, one eye, low vision other eye, unspecified eyes: Secondary | ICD-10-CM | POA: Diagnosis not present

## 2015-08-11 DIAGNOSIS — R488 Other symbolic dysfunctions: Secondary | ICD-10-CM | POA: Diagnosis not present

## 2015-08-11 DIAGNOSIS — R278 Other lack of coordination: Secondary | ICD-10-CM | POA: Diagnosis not present

## 2015-08-11 DIAGNOSIS — F0390 Unspecified dementia without behavioral disturbance: Secondary | ICD-10-CM | POA: Diagnosis not present

## 2015-08-11 DIAGNOSIS — M62111 Other rupture of muscle (nontraumatic), right shoulder: Secondary | ICD-10-CM | POA: Diagnosis not present

## 2015-08-11 DIAGNOSIS — R2689 Other abnormalities of gait and mobility: Secondary | ICD-10-CM | POA: Diagnosis not present

## 2015-08-13 DIAGNOSIS — H5410 Blindness, one eye, low vision other eye, unspecified eyes: Secondary | ICD-10-CM | POA: Diagnosis not present

## 2015-08-13 DIAGNOSIS — R278 Other lack of coordination: Secondary | ICD-10-CM | POA: Diagnosis not present

## 2015-08-13 DIAGNOSIS — M62111 Other rupture of muscle (nontraumatic), right shoulder: Secondary | ICD-10-CM | POA: Diagnosis not present

## 2015-08-13 DIAGNOSIS — R2689 Other abnormalities of gait and mobility: Secondary | ICD-10-CM | POA: Diagnosis not present

## 2015-08-13 DIAGNOSIS — F0390 Unspecified dementia without behavioral disturbance: Secondary | ICD-10-CM | POA: Diagnosis not present

## 2015-08-13 DIAGNOSIS — R488 Other symbolic dysfunctions: Secondary | ICD-10-CM | POA: Diagnosis not present

## 2015-08-14 DIAGNOSIS — R278 Other lack of coordination: Secondary | ICD-10-CM | POA: Diagnosis not present

## 2015-08-14 DIAGNOSIS — M62111 Other rupture of muscle (nontraumatic), right shoulder: Secondary | ICD-10-CM | POA: Diagnosis not present

## 2015-08-14 DIAGNOSIS — Z950 Presence of cardiac pacemaker: Secondary | ICD-10-CM | POA: Diagnosis not present

## 2015-08-14 DIAGNOSIS — H5410 Blindness, one eye, low vision other eye, unspecified eyes: Secondary | ICD-10-CM | POA: Diagnosis not present

## 2015-08-14 DIAGNOSIS — E119 Type 2 diabetes mellitus without complications: Secondary | ICD-10-CM | POA: Diagnosis not present

## 2015-08-14 DIAGNOSIS — K219 Gastro-esophageal reflux disease without esophagitis: Secondary | ICD-10-CM | POA: Diagnosis not present

## 2015-08-14 DIAGNOSIS — I252 Old myocardial infarction: Secondary | ICD-10-CM | POA: Diagnosis not present

## 2015-08-14 DIAGNOSIS — I6359 Cerebral infarction due to unspecified occlusion or stenosis of other cerebral artery: Secondary | ICD-10-CM | POA: Diagnosis not present

## 2015-08-14 DIAGNOSIS — E785 Hyperlipidemia, unspecified: Secondary | ICD-10-CM | POA: Diagnosis not present

## 2015-08-14 DIAGNOSIS — R2689 Other abnormalities of gait and mobility: Secondary | ICD-10-CM | POA: Diagnosis not present

## 2015-08-14 DIAGNOSIS — R488 Other symbolic dysfunctions: Secondary | ICD-10-CM | POA: Diagnosis not present

## 2015-08-14 DIAGNOSIS — I2510 Atherosclerotic heart disease of native coronary artery without angina pectoris: Secondary | ICD-10-CM | POA: Diagnosis not present

## 2015-08-14 DIAGNOSIS — I10 Essential (primary) hypertension: Secondary | ICD-10-CM | POA: Diagnosis not present

## 2015-08-14 DIAGNOSIS — F0390 Unspecified dementia without behavioral disturbance: Secondary | ICD-10-CM | POA: Diagnosis not present

## 2015-08-15 DIAGNOSIS — H5410 Blindness, one eye, low vision other eye, unspecified eyes: Secondary | ICD-10-CM | POA: Diagnosis not present

## 2015-08-15 DIAGNOSIS — R278 Other lack of coordination: Secondary | ICD-10-CM | POA: Diagnosis not present

## 2015-08-15 DIAGNOSIS — R2689 Other abnormalities of gait and mobility: Secondary | ICD-10-CM | POA: Diagnosis not present

## 2015-08-15 DIAGNOSIS — R488 Other symbolic dysfunctions: Secondary | ICD-10-CM | POA: Diagnosis not present

## 2015-08-15 DIAGNOSIS — F0390 Unspecified dementia without behavioral disturbance: Secondary | ICD-10-CM | POA: Diagnosis not present

## 2015-08-15 DIAGNOSIS — M62111 Other rupture of muscle (nontraumatic), right shoulder: Secondary | ICD-10-CM | POA: Diagnosis not present

## 2015-08-16 DIAGNOSIS — M62111 Other rupture of muscle (nontraumatic), right shoulder: Secondary | ICD-10-CM | POA: Diagnosis not present

## 2015-08-16 DIAGNOSIS — F0390 Unspecified dementia without behavioral disturbance: Secondary | ICD-10-CM | POA: Diagnosis not present

## 2015-08-16 DIAGNOSIS — R488 Other symbolic dysfunctions: Secondary | ICD-10-CM | POA: Diagnosis not present

## 2015-08-16 DIAGNOSIS — H5410 Blindness, one eye, low vision other eye, unspecified eyes: Secondary | ICD-10-CM | POA: Diagnosis not present

## 2015-08-16 DIAGNOSIS — R2689 Other abnormalities of gait and mobility: Secondary | ICD-10-CM | POA: Diagnosis not present

## 2015-08-16 DIAGNOSIS — R278 Other lack of coordination: Secondary | ICD-10-CM | POA: Diagnosis not present

## 2015-08-17 DIAGNOSIS — R278 Other lack of coordination: Secondary | ICD-10-CM | POA: Diagnosis not present

## 2015-08-17 DIAGNOSIS — H5410 Blindness, one eye, low vision other eye, unspecified eyes: Secondary | ICD-10-CM | POA: Diagnosis not present

## 2015-08-17 DIAGNOSIS — F0390 Unspecified dementia without behavioral disturbance: Secondary | ICD-10-CM | POA: Diagnosis not present

## 2015-08-17 DIAGNOSIS — M62111 Other rupture of muscle (nontraumatic), right shoulder: Secondary | ICD-10-CM | POA: Diagnosis not present

## 2015-08-17 DIAGNOSIS — R2689 Other abnormalities of gait and mobility: Secondary | ICD-10-CM | POA: Diagnosis not present

## 2015-08-17 DIAGNOSIS — R488 Other symbolic dysfunctions: Secondary | ICD-10-CM | POA: Diagnosis not present

## 2015-08-19 DIAGNOSIS — R488 Other symbolic dysfunctions: Secondary | ICD-10-CM | POA: Diagnosis not present

## 2015-08-19 DIAGNOSIS — H5410 Blindness, one eye, low vision other eye, unspecified eyes: Secondary | ICD-10-CM | POA: Diagnosis not present

## 2015-08-19 DIAGNOSIS — R2689 Other abnormalities of gait and mobility: Secondary | ICD-10-CM | POA: Diagnosis not present

## 2015-08-19 DIAGNOSIS — F0390 Unspecified dementia without behavioral disturbance: Secondary | ICD-10-CM | POA: Diagnosis not present

## 2015-08-19 DIAGNOSIS — R278 Other lack of coordination: Secondary | ICD-10-CM | POA: Diagnosis not present

## 2015-08-19 DIAGNOSIS — M62111 Other rupture of muscle (nontraumatic), right shoulder: Secondary | ICD-10-CM | POA: Diagnosis not present

## 2015-08-20 DIAGNOSIS — R278 Other lack of coordination: Secondary | ICD-10-CM | POA: Diagnosis not present

## 2015-08-20 DIAGNOSIS — R488 Other symbolic dysfunctions: Secondary | ICD-10-CM | POA: Diagnosis not present

## 2015-08-20 DIAGNOSIS — F0390 Unspecified dementia without behavioral disturbance: Secondary | ICD-10-CM | POA: Diagnosis not present

## 2015-08-20 DIAGNOSIS — M62111 Other rupture of muscle (nontraumatic), right shoulder: Secondary | ICD-10-CM | POA: Diagnosis not present

## 2015-08-20 DIAGNOSIS — R2689 Other abnormalities of gait and mobility: Secondary | ICD-10-CM | POA: Diagnosis not present

## 2015-08-20 DIAGNOSIS — H5410 Blindness, one eye, low vision other eye, unspecified eyes: Secondary | ICD-10-CM | POA: Diagnosis not present

## 2015-08-21 DIAGNOSIS — F0390 Unspecified dementia without behavioral disturbance: Secondary | ICD-10-CM | POA: Diagnosis not present

## 2015-08-21 DIAGNOSIS — R488 Other symbolic dysfunctions: Secondary | ICD-10-CM | POA: Diagnosis not present

## 2015-08-21 DIAGNOSIS — M62111 Other rupture of muscle (nontraumatic), right shoulder: Secondary | ICD-10-CM | POA: Diagnosis not present

## 2015-08-21 DIAGNOSIS — R2689 Other abnormalities of gait and mobility: Secondary | ICD-10-CM | POA: Diagnosis not present

## 2015-08-21 DIAGNOSIS — R278 Other lack of coordination: Secondary | ICD-10-CM | POA: Diagnosis not present

## 2015-08-21 DIAGNOSIS — H5410 Blindness, one eye, low vision other eye, unspecified eyes: Secondary | ICD-10-CM | POA: Diagnosis not present

## 2015-08-22 DIAGNOSIS — R2689 Other abnormalities of gait and mobility: Secondary | ICD-10-CM | POA: Diagnosis not present

## 2015-08-22 DIAGNOSIS — M62111 Other rupture of muscle (nontraumatic), right shoulder: Secondary | ICD-10-CM | POA: Diagnosis not present

## 2015-08-22 DIAGNOSIS — F0390 Unspecified dementia without behavioral disturbance: Secondary | ICD-10-CM | POA: Diagnosis not present

## 2015-08-22 DIAGNOSIS — H5410 Blindness, one eye, low vision other eye, unspecified eyes: Secondary | ICD-10-CM | POA: Diagnosis not present

## 2015-08-22 DIAGNOSIS — R488 Other symbolic dysfunctions: Secondary | ICD-10-CM | POA: Diagnosis not present

## 2015-08-22 DIAGNOSIS — R278 Other lack of coordination: Secondary | ICD-10-CM | POA: Diagnosis not present

## 2015-08-23 DIAGNOSIS — H5410 Blindness, one eye, low vision other eye, unspecified eyes: Secondary | ICD-10-CM | POA: Diagnosis not present

## 2015-08-23 DIAGNOSIS — F0390 Unspecified dementia without behavioral disturbance: Secondary | ICD-10-CM | POA: Diagnosis not present

## 2015-08-23 DIAGNOSIS — R278 Other lack of coordination: Secondary | ICD-10-CM | POA: Diagnosis not present

## 2015-08-23 DIAGNOSIS — R488 Other symbolic dysfunctions: Secondary | ICD-10-CM | POA: Diagnosis not present

## 2015-08-23 DIAGNOSIS — M62111 Other rupture of muscle (nontraumatic), right shoulder: Secondary | ICD-10-CM | POA: Diagnosis not present

## 2015-08-23 DIAGNOSIS — R2689 Other abnormalities of gait and mobility: Secondary | ICD-10-CM | POA: Diagnosis not present

## 2015-08-24 DIAGNOSIS — R278 Other lack of coordination: Secondary | ICD-10-CM | POA: Diagnosis not present

## 2015-08-24 DIAGNOSIS — R488 Other symbolic dysfunctions: Secondary | ICD-10-CM | POA: Diagnosis not present

## 2015-08-24 DIAGNOSIS — R2689 Other abnormalities of gait and mobility: Secondary | ICD-10-CM | POA: Diagnosis not present

## 2015-08-24 DIAGNOSIS — F0390 Unspecified dementia without behavioral disturbance: Secondary | ICD-10-CM | POA: Diagnosis not present

## 2015-08-24 DIAGNOSIS — M62111 Other rupture of muscle (nontraumatic), right shoulder: Secondary | ICD-10-CM | POA: Diagnosis not present

## 2015-08-24 DIAGNOSIS — H5410 Blindness, one eye, low vision other eye, unspecified eyes: Secondary | ICD-10-CM | POA: Diagnosis not present

## 2015-08-27 DIAGNOSIS — M62111 Other rupture of muscle (nontraumatic), right shoulder: Secondary | ICD-10-CM | POA: Diagnosis not present

## 2015-08-27 DIAGNOSIS — R488 Other symbolic dysfunctions: Secondary | ICD-10-CM | POA: Diagnosis not present

## 2015-08-27 DIAGNOSIS — F0390 Unspecified dementia without behavioral disturbance: Secondary | ICD-10-CM | POA: Diagnosis not present

## 2015-08-27 DIAGNOSIS — E119 Type 2 diabetes mellitus without complications: Secondary | ICD-10-CM | POA: Diagnosis not present

## 2015-08-27 DIAGNOSIS — N183 Chronic kidney disease, stage 3 (moderate): Secondary | ICD-10-CM | POA: Diagnosis not present

## 2015-08-27 DIAGNOSIS — R278 Other lack of coordination: Secondary | ICD-10-CM | POA: Diagnosis not present

## 2015-08-27 DIAGNOSIS — I5032 Chronic diastolic (congestive) heart failure: Secondary | ICD-10-CM | POA: Diagnosis not present

## 2015-08-27 DIAGNOSIS — F0391 Unspecified dementia with behavioral disturbance: Secondary | ICD-10-CM | POA: Diagnosis not present

## 2015-08-27 DIAGNOSIS — H5410 Blindness, one eye, low vision other eye, unspecified eyes: Secondary | ICD-10-CM | POA: Diagnosis not present

## 2015-08-27 DIAGNOSIS — R2689 Other abnormalities of gait and mobility: Secondary | ICD-10-CM | POA: Diagnosis not present

## 2015-08-28 DIAGNOSIS — R488 Other symbolic dysfunctions: Secondary | ICD-10-CM | POA: Diagnosis not present

## 2015-08-28 DIAGNOSIS — R278 Other lack of coordination: Secondary | ICD-10-CM | POA: Diagnosis not present

## 2015-08-28 DIAGNOSIS — M62111 Other rupture of muscle (nontraumatic), right shoulder: Secondary | ICD-10-CM | POA: Diagnosis not present

## 2015-08-28 DIAGNOSIS — F0390 Unspecified dementia without behavioral disturbance: Secondary | ICD-10-CM | POA: Diagnosis not present

## 2015-08-28 DIAGNOSIS — R2689 Other abnormalities of gait and mobility: Secondary | ICD-10-CM | POA: Diagnosis not present

## 2015-08-28 DIAGNOSIS — H5410 Blindness, one eye, low vision other eye, unspecified eyes: Secondary | ICD-10-CM | POA: Diagnosis not present

## 2015-08-29 DIAGNOSIS — M6281 Muscle weakness (generalized): Secondary | ICD-10-CM | POA: Diagnosis not present

## 2015-08-29 DIAGNOSIS — H5410 Blindness, one eye, low vision other eye, unspecified eyes: Secondary | ICD-10-CM | POA: Diagnosis not present

## 2015-08-29 DIAGNOSIS — R278 Other lack of coordination: Secondary | ICD-10-CM | POA: Diagnosis not present

## 2015-08-29 DIAGNOSIS — R296 Repeated falls: Secondary | ICD-10-CM | POA: Diagnosis not present

## 2015-08-29 DIAGNOSIS — F0390 Unspecified dementia without behavioral disturbance: Secondary | ICD-10-CM | POA: Diagnosis not present

## 2015-08-29 DIAGNOSIS — M62111 Other rupture of muscle (nontraumatic), right shoulder: Secondary | ICD-10-CM | POA: Diagnosis not present

## 2015-08-29 DIAGNOSIS — R488 Other symbolic dysfunctions: Secondary | ICD-10-CM | POA: Diagnosis not present

## 2015-08-29 DIAGNOSIS — R2689 Other abnormalities of gait and mobility: Secondary | ICD-10-CM | POA: Diagnosis not present

## 2015-08-30 DIAGNOSIS — R2689 Other abnormalities of gait and mobility: Secondary | ICD-10-CM | POA: Diagnosis not present

## 2015-08-30 DIAGNOSIS — R278 Other lack of coordination: Secondary | ICD-10-CM | POA: Diagnosis not present

## 2015-08-30 DIAGNOSIS — F0390 Unspecified dementia without behavioral disturbance: Secondary | ICD-10-CM | POA: Diagnosis not present

## 2015-08-30 DIAGNOSIS — R488 Other symbolic dysfunctions: Secondary | ICD-10-CM | POA: Diagnosis not present

## 2015-08-30 DIAGNOSIS — H5410 Blindness, one eye, low vision other eye, unspecified eyes: Secondary | ICD-10-CM | POA: Diagnosis not present

## 2015-08-30 DIAGNOSIS — M62111 Other rupture of muscle (nontraumatic), right shoulder: Secondary | ICD-10-CM | POA: Diagnosis not present

## 2015-08-31 DIAGNOSIS — R488 Other symbolic dysfunctions: Secondary | ICD-10-CM | POA: Diagnosis not present

## 2015-08-31 DIAGNOSIS — H5410 Blindness, one eye, low vision other eye, unspecified eyes: Secondary | ICD-10-CM | POA: Diagnosis not present

## 2015-08-31 DIAGNOSIS — F0390 Unspecified dementia without behavioral disturbance: Secondary | ICD-10-CM | POA: Diagnosis not present

## 2015-08-31 DIAGNOSIS — R278 Other lack of coordination: Secondary | ICD-10-CM | POA: Diagnosis not present

## 2015-08-31 DIAGNOSIS — M62111 Other rupture of muscle (nontraumatic), right shoulder: Secondary | ICD-10-CM | POA: Diagnosis not present

## 2015-08-31 DIAGNOSIS — R2689 Other abnormalities of gait and mobility: Secondary | ICD-10-CM | POA: Diagnosis not present

## 2015-09-03 DIAGNOSIS — R278 Other lack of coordination: Secondary | ICD-10-CM | POA: Diagnosis not present

## 2015-09-03 DIAGNOSIS — M62111 Other rupture of muscle (nontraumatic), right shoulder: Secondary | ICD-10-CM | POA: Diagnosis not present

## 2015-09-03 DIAGNOSIS — H5410 Blindness, one eye, low vision other eye, unspecified eyes: Secondary | ICD-10-CM | POA: Diagnosis not present

## 2015-09-03 DIAGNOSIS — F0390 Unspecified dementia without behavioral disturbance: Secondary | ICD-10-CM | POA: Diagnosis not present

## 2015-09-03 DIAGNOSIS — R2689 Other abnormalities of gait and mobility: Secondary | ICD-10-CM | POA: Diagnosis not present

## 2015-09-03 DIAGNOSIS — R488 Other symbolic dysfunctions: Secondary | ICD-10-CM | POA: Diagnosis not present

## 2015-09-04 DIAGNOSIS — R488 Other symbolic dysfunctions: Secondary | ICD-10-CM | POA: Diagnosis not present

## 2015-09-04 DIAGNOSIS — F0390 Unspecified dementia without behavioral disturbance: Secondary | ICD-10-CM | POA: Diagnosis not present

## 2015-09-04 DIAGNOSIS — R278 Other lack of coordination: Secondary | ICD-10-CM | POA: Diagnosis not present

## 2015-09-04 DIAGNOSIS — R2689 Other abnormalities of gait and mobility: Secondary | ICD-10-CM | POA: Diagnosis not present

## 2015-09-04 DIAGNOSIS — H5410 Blindness, one eye, low vision other eye, unspecified eyes: Secondary | ICD-10-CM | POA: Diagnosis not present

## 2015-09-04 DIAGNOSIS — M62111 Other rupture of muscle (nontraumatic), right shoulder: Secondary | ICD-10-CM | POA: Diagnosis not present

## 2015-09-05 DIAGNOSIS — R278 Other lack of coordination: Secondary | ICD-10-CM | POA: Diagnosis not present

## 2015-09-05 DIAGNOSIS — F0390 Unspecified dementia without behavioral disturbance: Secondary | ICD-10-CM | POA: Diagnosis not present

## 2015-09-05 DIAGNOSIS — H5410 Blindness, one eye, low vision other eye, unspecified eyes: Secondary | ICD-10-CM | POA: Diagnosis not present

## 2015-09-05 DIAGNOSIS — R488 Other symbolic dysfunctions: Secondary | ICD-10-CM | POA: Diagnosis not present

## 2015-09-05 DIAGNOSIS — M62111 Other rupture of muscle (nontraumatic), right shoulder: Secondary | ICD-10-CM | POA: Diagnosis not present

## 2015-09-05 DIAGNOSIS — R2689 Other abnormalities of gait and mobility: Secondary | ICD-10-CM | POA: Diagnosis not present

## 2015-09-06 DIAGNOSIS — R2689 Other abnormalities of gait and mobility: Secondary | ICD-10-CM | POA: Diagnosis not present

## 2015-09-06 DIAGNOSIS — R488 Other symbolic dysfunctions: Secondary | ICD-10-CM | POA: Diagnosis not present

## 2015-09-06 DIAGNOSIS — M62111 Other rupture of muscle (nontraumatic), right shoulder: Secondary | ICD-10-CM | POA: Diagnosis not present

## 2015-09-06 DIAGNOSIS — F0390 Unspecified dementia without behavioral disturbance: Secondary | ICD-10-CM | POA: Diagnosis not present

## 2015-09-06 DIAGNOSIS — H5410 Blindness, one eye, low vision other eye, unspecified eyes: Secondary | ICD-10-CM | POA: Diagnosis not present

## 2015-09-06 DIAGNOSIS — R278 Other lack of coordination: Secondary | ICD-10-CM | POA: Diagnosis not present

## 2015-09-07 DIAGNOSIS — M62111 Other rupture of muscle (nontraumatic), right shoulder: Secondary | ICD-10-CM | POA: Diagnosis not present

## 2015-09-07 DIAGNOSIS — R2689 Other abnormalities of gait and mobility: Secondary | ICD-10-CM | POA: Diagnosis not present

## 2015-09-07 DIAGNOSIS — R278 Other lack of coordination: Secondary | ICD-10-CM | POA: Diagnosis not present

## 2015-09-07 DIAGNOSIS — R488 Other symbolic dysfunctions: Secondary | ICD-10-CM | POA: Diagnosis not present

## 2015-09-07 DIAGNOSIS — F0390 Unspecified dementia without behavioral disturbance: Secondary | ICD-10-CM | POA: Diagnosis not present

## 2015-09-07 DIAGNOSIS — H5410 Blindness, one eye, low vision other eye, unspecified eyes: Secondary | ICD-10-CM | POA: Diagnosis not present

## 2015-09-10 DIAGNOSIS — M62111 Other rupture of muscle (nontraumatic), right shoulder: Secondary | ICD-10-CM | POA: Diagnosis not present

## 2015-09-10 DIAGNOSIS — H5410 Blindness, one eye, low vision other eye, unspecified eyes: Secondary | ICD-10-CM | POA: Diagnosis not present

## 2015-09-10 DIAGNOSIS — R278 Other lack of coordination: Secondary | ICD-10-CM | POA: Diagnosis not present

## 2015-09-10 DIAGNOSIS — R488 Other symbolic dysfunctions: Secondary | ICD-10-CM | POA: Diagnosis not present

## 2015-09-10 DIAGNOSIS — R2689 Other abnormalities of gait and mobility: Secondary | ICD-10-CM | POA: Diagnosis not present

## 2015-09-10 DIAGNOSIS — F0390 Unspecified dementia without behavioral disturbance: Secondary | ICD-10-CM | POA: Diagnosis not present

## 2015-09-12 DIAGNOSIS — M62111 Other rupture of muscle (nontraumatic), right shoulder: Secondary | ICD-10-CM | POA: Diagnosis not present

## 2015-09-12 DIAGNOSIS — R278 Other lack of coordination: Secondary | ICD-10-CM | POA: Diagnosis not present

## 2015-09-12 DIAGNOSIS — F0390 Unspecified dementia without behavioral disturbance: Secondary | ICD-10-CM | POA: Diagnosis not present

## 2015-09-12 DIAGNOSIS — R488 Other symbolic dysfunctions: Secondary | ICD-10-CM | POA: Diagnosis not present

## 2015-09-12 DIAGNOSIS — R2689 Other abnormalities of gait and mobility: Secondary | ICD-10-CM | POA: Diagnosis not present

## 2015-09-12 DIAGNOSIS — H5410 Blindness, one eye, low vision other eye, unspecified eyes: Secondary | ICD-10-CM | POA: Diagnosis not present

## 2015-09-14 DIAGNOSIS — M62111 Other rupture of muscle (nontraumatic), right shoulder: Secondary | ICD-10-CM | POA: Diagnosis not present

## 2015-09-14 DIAGNOSIS — R278 Other lack of coordination: Secondary | ICD-10-CM | POA: Diagnosis not present

## 2015-09-14 DIAGNOSIS — R488 Other symbolic dysfunctions: Secondary | ICD-10-CM | POA: Diagnosis not present

## 2015-09-14 DIAGNOSIS — H5410 Blindness, one eye, low vision other eye, unspecified eyes: Secondary | ICD-10-CM | POA: Diagnosis not present

## 2015-09-14 DIAGNOSIS — R2689 Other abnormalities of gait and mobility: Secondary | ICD-10-CM | POA: Diagnosis not present

## 2015-09-14 DIAGNOSIS — F0390 Unspecified dementia without behavioral disturbance: Secondary | ICD-10-CM | POA: Diagnosis not present

## 2015-09-16 DIAGNOSIS — R488 Other symbolic dysfunctions: Secondary | ICD-10-CM | POA: Diagnosis not present

## 2015-09-16 DIAGNOSIS — R278 Other lack of coordination: Secondary | ICD-10-CM | POA: Diagnosis not present

## 2015-09-16 DIAGNOSIS — F0390 Unspecified dementia without behavioral disturbance: Secondary | ICD-10-CM | POA: Diagnosis not present

## 2015-09-16 DIAGNOSIS — H5410 Blindness, one eye, low vision other eye, unspecified eyes: Secondary | ICD-10-CM | POA: Diagnosis not present

## 2015-09-16 DIAGNOSIS — M62111 Other rupture of muscle (nontraumatic), right shoulder: Secondary | ICD-10-CM | POA: Diagnosis not present

## 2015-09-16 DIAGNOSIS — R2689 Other abnormalities of gait and mobility: Secondary | ICD-10-CM | POA: Diagnosis not present

## 2015-09-17 DIAGNOSIS — M62111 Other rupture of muscle (nontraumatic), right shoulder: Secondary | ICD-10-CM | POA: Diagnosis not present

## 2015-09-17 DIAGNOSIS — R2689 Other abnormalities of gait and mobility: Secondary | ICD-10-CM | POA: Diagnosis not present

## 2015-09-17 DIAGNOSIS — H5410 Blindness, one eye, low vision other eye, unspecified eyes: Secondary | ICD-10-CM | POA: Diagnosis not present

## 2015-09-17 DIAGNOSIS — F0390 Unspecified dementia without behavioral disturbance: Secondary | ICD-10-CM | POA: Diagnosis not present

## 2015-09-17 DIAGNOSIS — R488 Other symbolic dysfunctions: Secondary | ICD-10-CM | POA: Diagnosis not present

## 2015-09-17 DIAGNOSIS — R278 Other lack of coordination: Secondary | ICD-10-CM | POA: Diagnosis not present

## 2015-09-18 DIAGNOSIS — F0390 Unspecified dementia without behavioral disturbance: Secondary | ICD-10-CM | POA: Diagnosis not present

## 2015-09-18 DIAGNOSIS — H5410 Blindness, one eye, low vision other eye, unspecified eyes: Secondary | ICD-10-CM | POA: Diagnosis not present

## 2015-09-18 DIAGNOSIS — R278 Other lack of coordination: Secondary | ICD-10-CM | POA: Diagnosis not present

## 2015-09-18 DIAGNOSIS — R2689 Other abnormalities of gait and mobility: Secondary | ICD-10-CM | POA: Diagnosis not present

## 2015-09-18 DIAGNOSIS — R488 Other symbolic dysfunctions: Secondary | ICD-10-CM | POA: Diagnosis not present

## 2015-09-18 DIAGNOSIS — M62111 Other rupture of muscle (nontraumatic), right shoulder: Secondary | ICD-10-CM | POA: Diagnosis not present

## 2015-09-19 DIAGNOSIS — R2689 Other abnormalities of gait and mobility: Secondary | ICD-10-CM | POA: Diagnosis not present

## 2015-09-19 DIAGNOSIS — M62111 Other rupture of muscle (nontraumatic), right shoulder: Secondary | ICD-10-CM | POA: Diagnosis not present

## 2015-09-19 DIAGNOSIS — F0390 Unspecified dementia without behavioral disturbance: Secondary | ICD-10-CM | POA: Diagnosis not present

## 2015-09-19 DIAGNOSIS — R488 Other symbolic dysfunctions: Secondary | ICD-10-CM | POA: Diagnosis not present

## 2015-09-19 DIAGNOSIS — R278 Other lack of coordination: Secondary | ICD-10-CM | POA: Diagnosis not present

## 2015-09-19 DIAGNOSIS — H5410 Blindness, one eye, low vision other eye, unspecified eyes: Secondary | ICD-10-CM | POA: Diagnosis not present

## 2015-09-20 DIAGNOSIS — F0390 Unspecified dementia without behavioral disturbance: Secondary | ICD-10-CM | POA: Diagnosis not present

## 2015-09-20 DIAGNOSIS — R278 Other lack of coordination: Secondary | ICD-10-CM | POA: Diagnosis not present

## 2015-09-20 DIAGNOSIS — R488 Other symbolic dysfunctions: Secondary | ICD-10-CM | POA: Diagnosis not present

## 2015-09-20 DIAGNOSIS — R2689 Other abnormalities of gait and mobility: Secondary | ICD-10-CM | POA: Diagnosis not present

## 2015-09-20 DIAGNOSIS — H5410 Blindness, one eye, low vision other eye, unspecified eyes: Secondary | ICD-10-CM | POA: Diagnosis not present

## 2015-09-20 DIAGNOSIS — M62111 Other rupture of muscle (nontraumatic), right shoulder: Secondary | ICD-10-CM | POA: Diagnosis not present

## 2015-09-24 DIAGNOSIS — F0390 Unspecified dementia without behavioral disturbance: Secondary | ICD-10-CM | POA: Diagnosis not present

## 2015-09-24 DIAGNOSIS — M62111 Other rupture of muscle (nontraumatic), right shoulder: Secondary | ICD-10-CM | POA: Diagnosis not present

## 2015-09-24 DIAGNOSIS — R2689 Other abnormalities of gait and mobility: Secondary | ICD-10-CM | POA: Diagnosis not present

## 2015-09-24 DIAGNOSIS — R488 Other symbolic dysfunctions: Secondary | ICD-10-CM | POA: Diagnosis not present

## 2015-09-24 DIAGNOSIS — R278 Other lack of coordination: Secondary | ICD-10-CM | POA: Diagnosis not present

## 2015-09-24 DIAGNOSIS — H5410 Blindness, one eye, low vision other eye, unspecified eyes: Secondary | ICD-10-CM | POA: Diagnosis not present

## 2015-09-26 DIAGNOSIS — R296 Repeated falls: Secondary | ICD-10-CM | POA: Diagnosis not present

## 2015-09-26 DIAGNOSIS — R2689 Other abnormalities of gait and mobility: Secondary | ICD-10-CM | POA: Diagnosis not present

## 2015-09-26 DIAGNOSIS — R278 Other lack of coordination: Secondary | ICD-10-CM | POA: Diagnosis not present

## 2015-09-26 DIAGNOSIS — M6281 Muscle weakness (generalized): Secondary | ICD-10-CM | POA: Diagnosis not present

## 2015-09-26 DIAGNOSIS — H5410 Blindness, one eye, low vision other eye, unspecified eyes: Secondary | ICD-10-CM | POA: Diagnosis not present

## 2015-09-27 DIAGNOSIS — R296 Repeated falls: Secondary | ICD-10-CM | POA: Diagnosis not present

## 2015-09-27 DIAGNOSIS — R278 Other lack of coordination: Secondary | ICD-10-CM | POA: Diagnosis not present

## 2015-09-27 DIAGNOSIS — R2689 Other abnormalities of gait and mobility: Secondary | ICD-10-CM | POA: Diagnosis not present

## 2015-09-27 DIAGNOSIS — H5410 Blindness, one eye, low vision other eye, unspecified eyes: Secondary | ICD-10-CM | POA: Diagnosis not present

## 2015-09-27 DIAGNOSIS — M6281 Muscle weakness (generalized): Secondary | ICD-10-CM | POA: Diagnosis not present

## 2015-09-28 DIAGNOSIS — M6281 Muscle weakness (generalized): Secondary | ICD-10-CM | POA: Diagnosis not present

## 2015-09-28 DIAGNOSIS — R2689 Other abnormalities of gait and mobility: Secondary | ICD-10-CM | POA: Diagnosis not present

## 2015-09-28 DIAGNOSIS — R278 Other lack of coordination: Secondary | ICD-10-CM | POA: Diagnosis not present

## 2015-09-28 DIAGNOSIS — H5410 Blindness, one eye, low vision other eye, unspecified eyes: Secondary | ICD-10-CM | POA: Diagnosis not present

## 2015-09-28 DIAGNOSIS — R296 Repeated falls: Secondary | ICD-10-CM | POA: Diagnosis not present

## 2015-10-01 DIAGNOSIS — M6281 Muscle weakness (generalized): Secondary | ICD-10-CM | POA: Diagnosis not present

## 2015-10-01 DIAGNOSIS — H5410 Blindness, one eye, low vision other eye, unspecified eyes: Secondary | ICD-10-CM | POA: Diagnosis not present

## 2015-10-01 DIAGNOSIS — R296 Repeated falls: Secondary | ICD-10-CM | POA: Diagnosis not present

## 2015-10-01 DIAGNOSIS — R2689 Other abnormalities of gait and mobility: Secondary | ICD-10-CM | POA: Diagnosis not present

## 2015-10-01 DIAGNOSIS — R278 Other lack of coordination: Secondary | ICD-10-CM | POA: Diagnosis not present

## 2015-10-01 IMAGING — CT CT HEAD W/O CM
1 series · 15 of 30 positions shown, 19 images · non-contrast
Comparison: 03/23/2012

CLINICAL DATA: Fall today, hematoma left posterior head, decreased
level of consciousness

EXAM:
CT HEAD WITHOUT CONTRAST
CT CERVICAL SPINE WITHOUT CONTRAST
TECHNIQUE: Multidetector CT imaging of the head and cervical spine was
performed following the standard protocol without intravenous
contrast. Multiplanar CT image reconstructions of the cervical spine
were also generated.

[Series 2: head 5.0 h30s · axial · 0.41mm/px · z∈[+1611,+1746]mm · 15 of 30 slices shown, 19 images]
[im 2/30  brain]
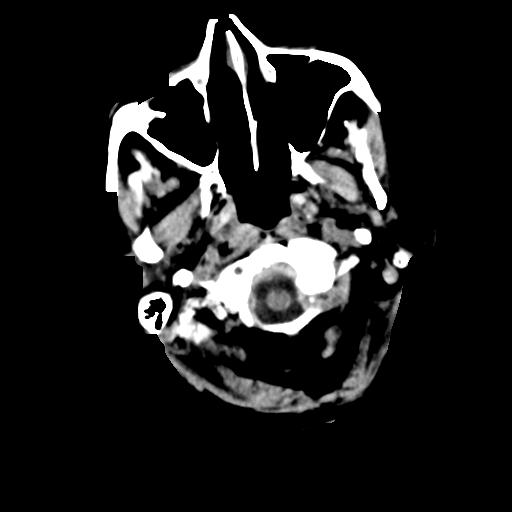
[im 2/30  bone]
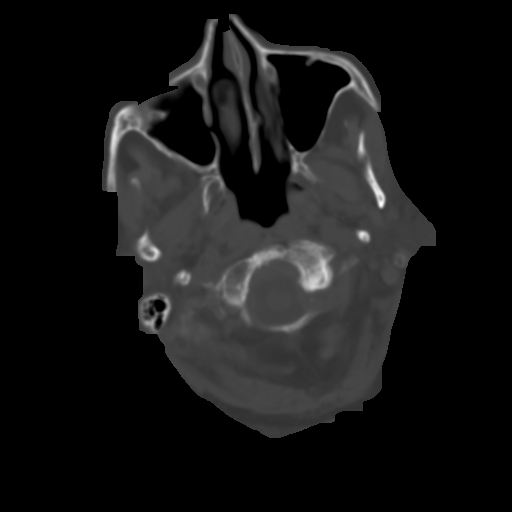
[im 4/30  brain]
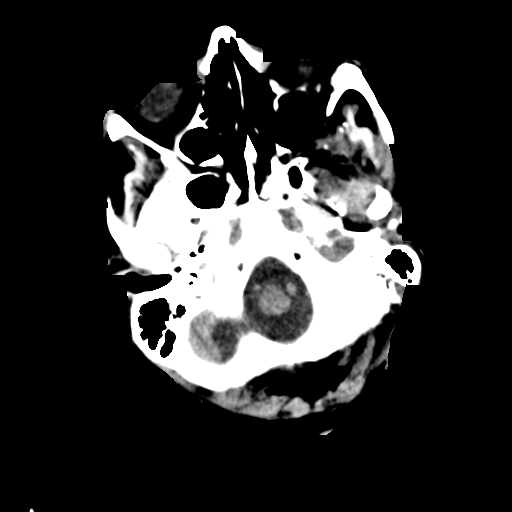
[im 6/30  brain]
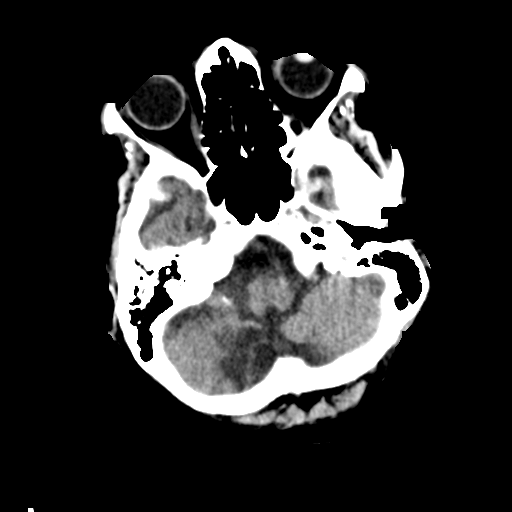
[im 8/30  brain]
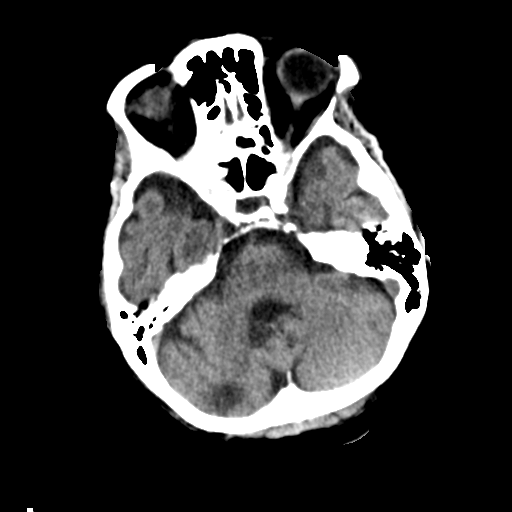
[im 10/30  brain]
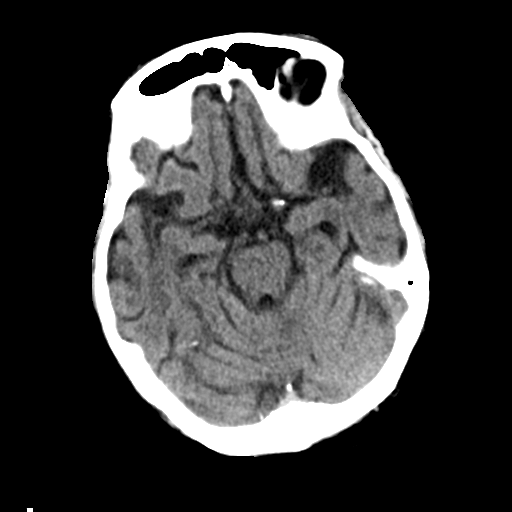
[im 10/30  bone]
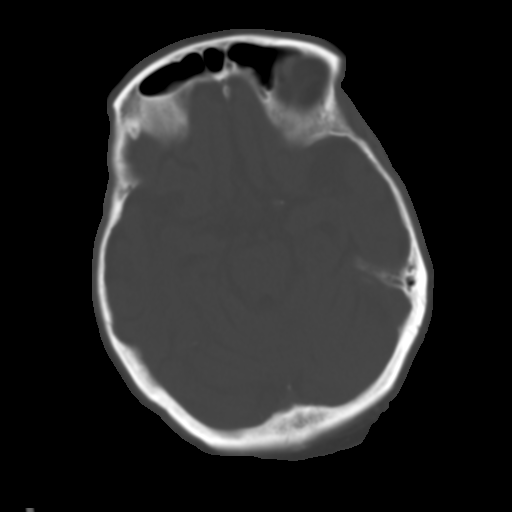
[im 12/30  brain]
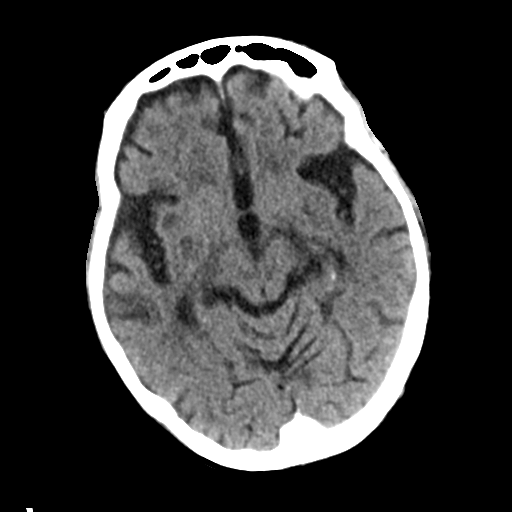
[im 14/30  brain]
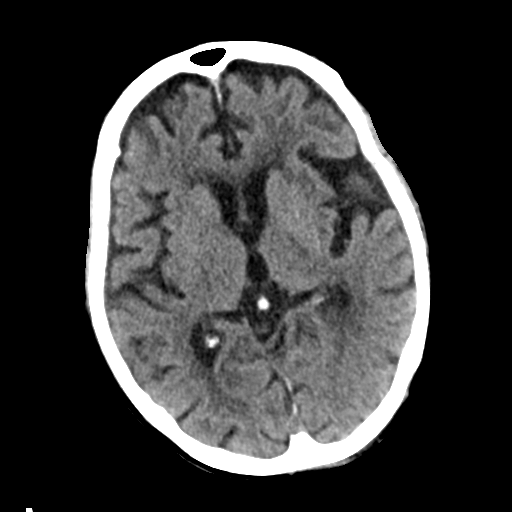
[im 16/30  brain]
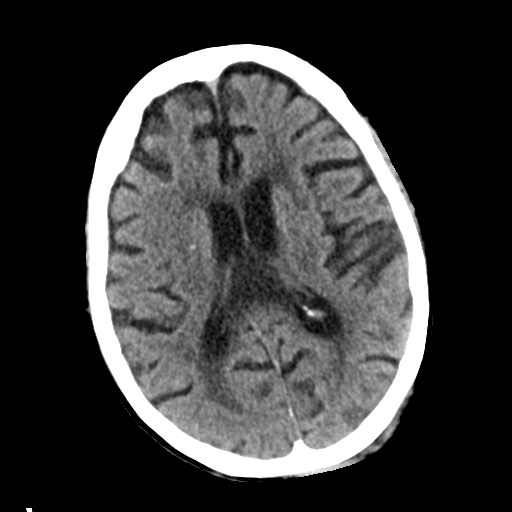
[im 17/30  brain]
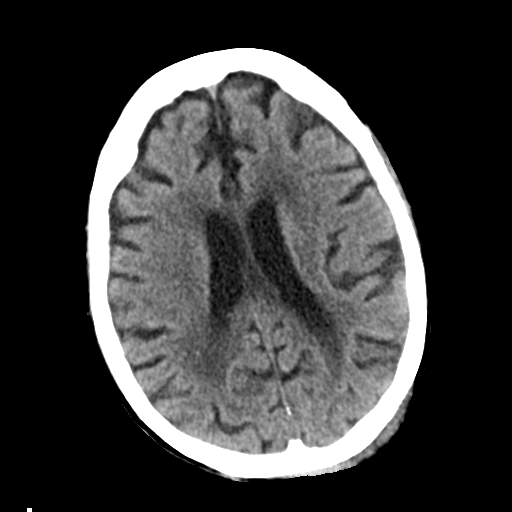
[im 17/30  bone]
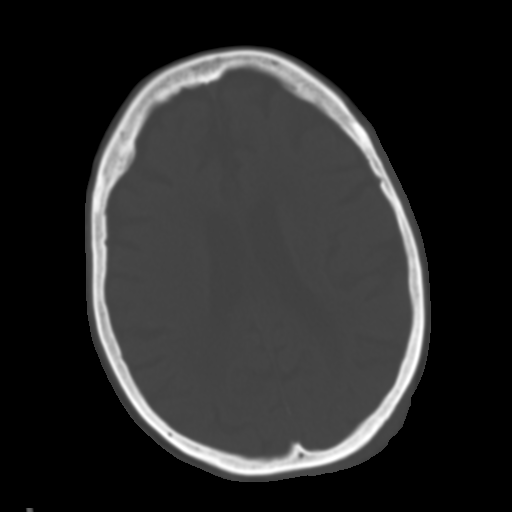
[im 19/30  brain]
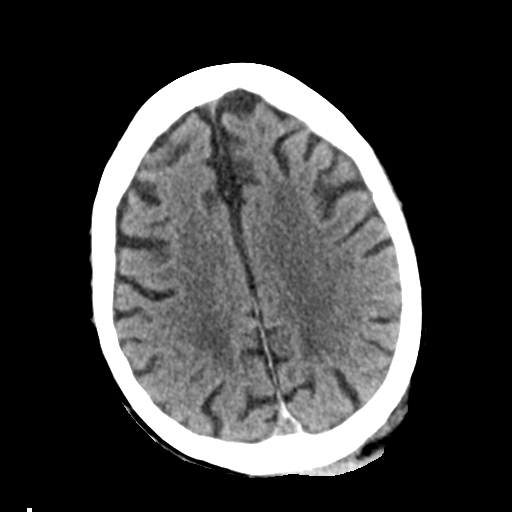
[im 21/30  brain]
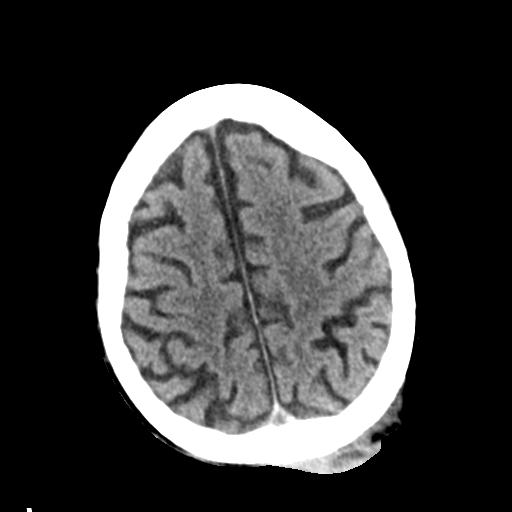
[im 23/30  brain]
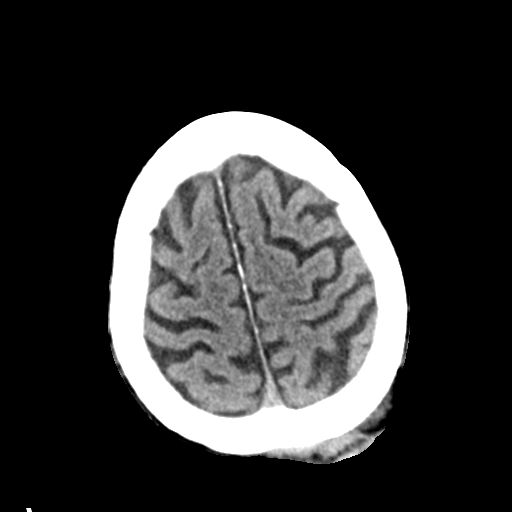
[im 25/30  brain]
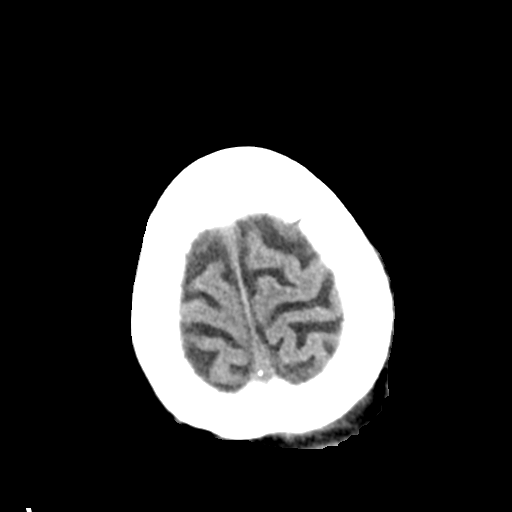
[im 25/30  bone]
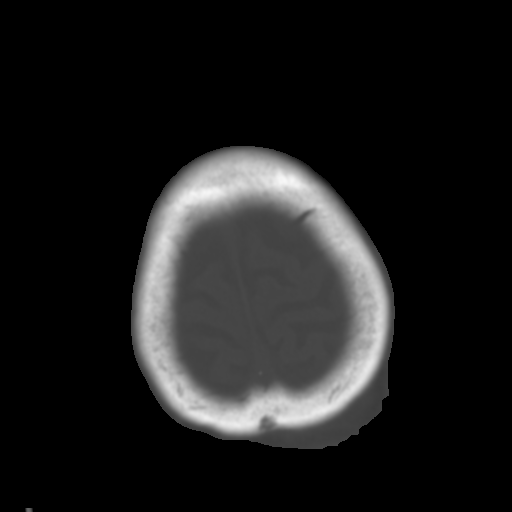
[im 27/30  brain]
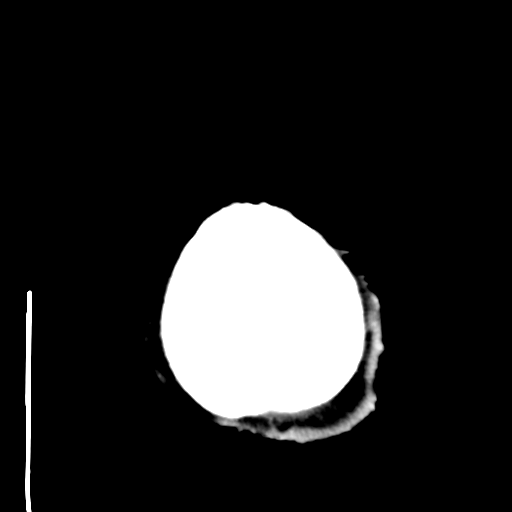
[im 29/30  brain]
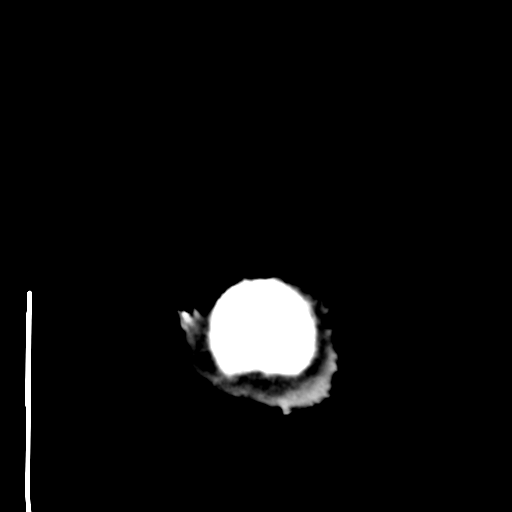

[15 of 30 positions shown; findings below may reference images not displayed]

FINDINGS: CT HEAD FINDINGS

Posterior left scalp hematoma. No intracranial hemorrhage or
extra-axial fluid. No evidence of vascular territory infarct. Severe
atrophy and white matter disease. Probable lacunar infarct left
thalamus and left basal ganglia, with a subacute to chronic
appearance but not [DATE].

No skull fracture.

CT CERVICAL SPINE FINDINGS

Levoscoliosis. Severe degenerative disc disease throughout the
cervical spine. Severe facet disease also present. No fracture
identified. Mild anterior listhesis of C4 on C5. Mild anterior
listhesis of C7 on T1. Severe degenerative disc disease at C5-6 and
C6-7.
IMPRESSION: Severe chronic change in both the head and cervical spine with no
acute traumatic injury other than left scalp hematoma.

## 2015-10-02 DIAGNOSIS — H5410 Blindness, one eye, low vision other eye, unspecified eyes: Secondary | ICD-10-CM | POA: Diagnosis not present

## 2015-10-02 DIAGNOSIS — R296 Repeated falls: Secondary | ICD-10-CM | POA: Diagnosis not present

## 2015-10-02 DIAGNOSIS — R278 Other lack of coordination: Secondary | ICD-10-CM | POA: Diagnosis not present

## 2015-10-02 DIAGNOSIS — R2689 Other abnormalities of gait and mobility: Secondary | ICD-10-CM | POA: Diagnosis not present

## 2015-10-02 DIAGNOSIS — M6281 Muscle weakness (generalized): Secondary | ICD-10-CM | POA: Diagnosis not present

## 2015-10-04 DIAGNOSIS — R278 Other lack of coordination: Secondary | ICD-10-CM | POA: Diagnosis not present

## 2015-10-04 DIAGNOSIS — H5410 Blindness, one eye, low vision other eye, unspecified eyes: Secondary | ICD-10-CM | POA: Diagnosis not present

## 2015-10-04 DIAGNOSIS — M6281 Muscle weakness (generalized): Secondary | ICD-10-CM | POA: Diagnosis not present

## 2015-10-04 DIAGNOSIS — R296 Repeated falls: Secondary | ICD-10-CM | POA: Diagnosis not present

## 2015-10-04 DIAGNOSIS — R2689 Other abnormalities of gait and mobility: Secondary | ICD-10-CM | POA: Diagnosis not present

## 2015-10-08 DIAGNOSIS — R296 Repeated falls: Secondary | ICD-10-CM | POA: Diagnosis not present

## 2015-10-08 DIAGNOSIS — R278 Other lack of coordination: Secondary | ICD-10-CM | POA: Diagnosis not present

## 2015-10-08 DIAGNOSIS — H5410 Blindness, one eye, low vision other eye, unspecified eyes: Secondary | ICD-10-CM | POA: Diagnosis not present

## 2015-10-08 DIAGNOSIS — M6281 Muscle weakness (generalized): Secondary | ICD-10-CM | POA: Diagnosis not present

## 2015-10-08 DIAGNOSIS — R2689 Other abnormalities of gait and mobility: Secondary | ICD-10-CM | POA: Diagnosis not present

## 2015-10-09 DIAGNOSIS — R296 Repeated falls: Secondary | ICD-10-CM | POA: Diagnosis not present

## 2015-10-09 DIAGNOSIS — R278 Other lack of coordination: Secondary | ICD-10-CM | POA: Diagnosis not present

## 2015-10-09 DIAGNOSIS — M6281 Muscle weakness (generalized): Secondary | ICD-10-CM | POA: Diagnosis not present

## 2015-10-09 DIAGNOSIS — H5410 Blindness, one eye, low vision other eye, unspecified eyes: Secondary | ICD-10-CM | POA: Diagnosis not present

## 2015-10-09 DIAGNOSIS — R2689 Other abnormalities of gait and mobility: Secondary | ICD-10-CM | POA: Diagnosis not present

## 2015-10-11 DIAGNOSIS — M6281 Muscle weakness (generalized): Secondary | ICD-10-CM | POA: Diagnosis not present

## 2015-10-11 DIAGNOSIS — R278 Other lack of coordination: Secondary | ICD-10-CM | POA: Diagnosis not present

## 2015-10-11 DIAGNOSIS — H5410 Blindness, one eye, low vision other eye, unspecified eyes: Secondary | ICD-10-CM | POA: Diagnosis not present

## 2015-10-11 DIAGNOSIS — R296 Repeated falls: Secondary | ICD-10-CM | POA: Diagnosis not present

## 2015-10-11 DIAGNOSIS — R2689 Other abnormalities of gait and mobility: Secondary | ICD-10-CM | POA: Diagnosis not present

## 2015-10-12 DIAGNOSIS — M6281 Muscle weakness (generalized): Secondary | ICD-10-CM | POA: Diagnosis not present

## 2015-10-12 DIAGNOSIS — R2689 Other abnormalities of gait and mobility: Secondary | ICD-10-CM | POA: Diagnosis not present

## 2015-10-12 DIAGNOSIS — H5410 Blindness, one eye, low vision other eye, unspecified eyes: Secondary | ICD-10-CM | POA: Diagnosis not present

## 2015-10-12 DIAGNOSIS — R296 Repeated falls: Secondary | ICD-10-CM | POA: Diagnosis not present

## 2015-10-12 DIAGNOSIS — R278 Other lack of coordination: Secondary | ICD-10-CM | POA: Diagnosis not present

## 2015-10-15 DIAGNOSIS — R2689 Other abnormalities of gait and mobility: Secondary | ICD-10-CM | POA: Diagnosis not present

## 2015-10-15 DIAGNOSIS — M6281 Muscle weakness (generalized): Secondary | ICD-10-CM | POA: Diagnosis not present

## 2015-10-15 DIAGNOSIS — H5410 Blindness, one eye, low vision other eye, unspecified eyes: Secondary | ICD-10-CM | POA: Diagnosis not present

## 2015-10-15 DIAGNOSIS — R296 Repeated falls: Secondary | ICD-10-CM | POA: Diagnosis not present

## 2015-10-15 DIAGNOSIS — R278 Other lack of coordination: Secondary | ICD-10-CM | POA: Diagnosis not present

## 2015-10-17 DIAGNOSIS — R2689 Other abnormalities of gait and mobility: Secondary | ICD-10-CM | POA: Diagnosis not present

## 2015-10-17 DIAGNOSIS — R296 Repeated falls: Secondary | ICD-10-CM | POA: Diagnosis not present

## 2015-10-17 DIAGNOSIS — R278 Other lack of coordination: Secondary | ICD-10-CM | POA: Diagnosis not present

## 2015-10-17 DIAGNOSIS — H5410 Blindness, one eye, low vision other eye, unspecified eyes: Secondary | ICD-10-CM | POA: Diagnosis not present

## 2015-10-17 DIAGNOSIS — M6281 Muscle weakness (generalized): Secondary | ICD-10-CM | POA: Diagnosis not present

## 2015-10-19 DIAGNOSIS — R278 Other lack of coordination: Secondary | ICD-10-CM | POA: Diagnosis not present

## 2015-10-19 DIAGNOSIS — M6281 Muscle weakness (generalized): Secondary | ICD-10-CM | POA: Diagnosis not present

## 2015-10-19 DIAGNOSIS — R296 Repeated falls: Secondary | ICD-10-CM | POA: Diagnosis not present

## 2015-10-19 DIAGNOSIS — H5410 Blindness, one eye, low vision other eye, unspecified eyes: Secondary | ICD-10-CM | POA: Diagnosis not present

## 2015-10-19 DIAGNOSIS — R2689 Other abnormalities of gait and mobility: Secondary | ICD-10-CM | POA: Diagnosis not present

## 2015-10-22 DIAGNOSIS — R296 Repeated falls: Secondary | ICD-10-CM | POA: Diagnosis not present

## 2015-10-22 DIAGNOSIS — R2689 Other abnormalities of gait and mobility: Secondary | ICD-10-CM | POA: Diagnosis not present

## 2015-10-22 DIAGNOSIS — R278 Other lack of coordination: Secondary | ICD-10-CM | POA: Diagnosis not present

## 2015-10-22 DIAGNOSIS — M6281 Muscle weakness (generalized): Secondary | ICD-10-CM | POA: Diagnosis not present

## 2015-10-22 DIAGNOSIS — H5410 Blindness, one eye, low vision other eye, unspecified eyes: Secondary | ICD-10-CM | POA: Diagnosis not present

## 2015-10-24 DIAGNOSIS — R278 Other lack of coordination: Secondary | ICD-10-CM | POA: Diagnosis not present

## 2015-10-24 DIAGNOSIS — R296 Repeated falls: Secondary | ICD-10-CM | POA: Diagnosis not present

## 2015-10-24 DIAGNOSIS — H5410 Blindness, one eye, low vision other eye, unspecified eyes: Secondary | ICD-10-CM | POA: Diagnosis not present

## 2015-10-24 DIAGNOSIS — R2689 Other abnormalities of gait and mobility: Secondary | ICD-10-CM | POA: Diagnosis not present

## 2015-10-24 DIAGNOSIS — M6281 Muscle weakness (generalized): Secondary | ICD-10-CM | POA: Diagnosis not present

## 2015-10-25 DIAGNOSIS — M6281 Muscle weakness (generalized): Secondary | ICD-10-CM | POA: Diagnosis not present

## 2015-10-25 DIAGNOSIS — R296 Repeated falls: Secondary | ICD-10-CM | POA: Diagnosis not present

## 2015-10-25 DIAGNOSIS — R278 Other lack of coordination: Secondary | ICD-10-CM | POA: Diagnosis not present

## 2015-10-25 DIAGNOSIS — H5410 Blindness, one eye, low vision other eye, unspecified eyes: Secondary | ICD-10-CM | POA: Diagnosis not present

## 2015-10-25 DIAGNOSIS — R2689 Other abnormalities of gait and mobility: Secondary | ICD-10-CM | POA: Diagnosis not present

## 2015-10-29 DIAGNOSIS — M6281 Muscle weakness (generalized): Secondary | ICD-10-CM | POA: Diagnosis not present

## 2015-10-29 DIAGNOSIS — R278 Other lack of coordination: Secondary | ICD-10-CM | POA: Diagnosis not present

## 2015-10-29 DIAGNOSIS — R2689 Other abnormalities of gait and mobility: Secondary | ICD-10-CM | POA: Diagnosis not present

## 2015-10-29 DIAGNOSIS — H5410 Blindness, one eye, low vision other eye, unspecified eyes: Secondary | ICD-10-CM | POA: Diagnosis not present

## 2015-10-29 DIAGNOSIS — R296 Repeated falls: Secondary | ICD-10-CM | POA: Diagnosis not present

## 2015-10-30 DIAGNOSIS — R296 Repeated falls: Secondary | ICD-10-CM | POA: Diagnosis not present

## 2015-10-30 DIAGNOSIS — M6281 Muscle weakness (generalized): Secondary | ICD-10-CM | POA: Diagnosis not present

## 2015-10-30 DIAGNOSIS — R2689 Other abnormalities of gait and mobility: Secondary | ICD-10-CM | POA: Diagnosis not present

## 2015-10-30 DIAGNOSIS — H5410 Blindness, one eye, low vision other eye, unspecified eyes: Secondary | ICD-10-CM | POA: Diagnosis not present

## 2015-10-30 DIAGNOSIS — R278 Other lack of coordination: Secondary | ICD-10-CM | POA: Diagnosis not present

## 2015-10-31 DIAGNOSIS — R296 Repeated falls: Secondary | ICD-10-CM | POA: Diagnosis not present

## 2015-10-31 DIAGNOSIS — R2689 Other abnormalities of gait and mobility: Secondary | ICD-10-CM | POA: Diagnosis not present

## 2015-10-31 DIAGNOSIS — H5410 Blindness, one eye, low vision other eye, unspecified eyes: Secondary | ICD-10-CM | POA: Diagnosis not present

## 2015-10-31 DIAGNOSIS — R278 Other lack of coordination: Secondary | ICD-10-CM | POA: Diagnosis not present

## 2015-10-31 DIAGNOSIS — M6281 Muscle weakness (generalized): Secondary | ICD-10-CM | POA: Diagnosis not present

## 2015-11-01 DIAGNOSIS — R278 Other lack of coordination: Secondary | ICD-10-CM | POA: Diagnosis not present

## 2015-11-01 DIAGNOSIS — H5410 Blindness, one eye, low vision other eye, unspecified eyes: Secondary | ICD-10-CM | POA: Diagnosis not present

## 2015-11-01 DIAGNOSIS — R2689 Other abnormalities of gait and mobility: Secondary | ICD-10-CM | POA: Diagnosis not present

## 2015-11-01 DIAGNOSIS — R296 Repeated falls: Secondary | ICD-10-CM | POA: Diagnosis not present

## 2015-11-01 DIAGNOSIS — M6281 Muscle weakness (generalized): Secondary | ICD-10-CM | POA: Diagnosis not present

## 2015-11-02 DIAGNOSIS — R278 Other lack of coordination: Secondary | ICD-10-CM | POA: Diagnosis not present

## 2015-11-02 DIAGNOSIS — R2689 Other abnormalities of gait and mobility: Secondary | ICD-10-CM | POA: Diagnosis not present

## 2015-11-02 DIAGNOSIS — M6281 Muscle weakness (generalized): Secondary | ICD-10-CM | POA: Diagnosis not present

## 2015-11-02 DIAGNOSIS — H5410 Blindness, one eye, low vision other eye, unspecified eyes: Secondary | ICD-10-CM | POA: Diagnosis not present

## 2015-11-02 DIAGNOSIS — R296 Repeated falls: Secondary | ICD-10-CM | POA: Diagnosis not present

## 2015-11-06 DIAGNOSIS — H5410 Blindness, one eye, low vision other eye, unspecified eyes: Secondary | ICD-10-CM | POA: Diagnosis not present

## 2015-11-06 DIAGNOSIS — R2689 Other abnormalities of gait and mobility: Secondary | ICD-10-CM | POA: Diagnosis not present

## 2015-11-06 DIAGNOSIS — M6281 Muscle weakness (generalized): Secondary | ICD-10-CM | POA: Diagnosis not present

## 2015-11-06 DIAGNOSIS — R296 Repeated falls: Secondary | ICD-10-CM | POA: Diagnosis not present

## 2015-11-06 DIAGNOSIS — R278 Other lack of coordination: Secondary | ICD-10-CM | POA: Diagnosis not present

## 2015-11-07 DIAGNOSIS — M6281 Muscle weakness (generalized): Secondary | ICD-10-CM | POA: Diagnosis not present

## 2015-11-07 DIAGNOSIS — R296 Repeated falls: Secondary | ICD-10-CM | POA: Diagnosis not present

## 2015-11-07 DIAGNOSIS — R278 Other lack of coordination: Secondary | ICD-10-CM | POA: Diagnosis not present

## 2015-11-07 DIAGNOSIS — H5410 Blindness, one eye, low vision other eye, unspecified eyes: Secondary | ICD-10-CM | POA: Diagnosis not present

## 2015-11-07 DIAGNOSIS — R2689 Other abnormalities of gait and mobility: Secondary | ICD-10-CM | POA: Diagnosis not present

## 2015-11-09 DIAGNOSIS — H5410 Blindness, one eye, low vision other eye, unspecified eyes: Secondary | ICD-10-CM | POA: Diagnosis not present

## 2015-11-09 DIAGNOSIS — R2689 Other abnormalities of gait and mobility: Secondary | ICD-10-CM | POA: Diagnosis not present

## 2015-11-09 DIAGNOSIS — M6281 Muscle weakness (generalized): Secondary | ICD-10-CM | POA: Diagnosis not present

## 2015-11-09 DIAGNOSIS — R278 Other lack of coordination: Secondary | ICD-10-CM | POA: Diagnosis not present

## 2015-11-09 DIAGNOSIS — R296 Repeated falls: Secondary | ICD-10-CM | POA: Diagnosis not present

## 2015-11-12 DIAGNOSIS — R2689 Other abnormalities of gait and mobility: Secondary | ICD-10-CM | POA: Diagnosis not present

## 2015-11-12 DIAGNOSIS — H5410 Blindness, one eye, low vision other eye, unspecified eyes: Secondary | ICD-10-CM | POA: Diagnosis not present

## 2015-11-12 DIAGNOSIS — M6281 Muscle weakness (generalized): Secondary | ICD-10-CM | POA: Diagnosis not present

## 2015-11-12 DIAGNOSIS — R296 Repeated falls: Secondary | ICD-10-CM | POA: Diagnosis not present

## 2015-11-12 DIAGNOSIS — R278 Other lack of coordination: Secondary | ICD-10-CM | POA: Diagnosis not present

## 2015-11-14 DIAGNOSIS — Z950 Presence of cardiac pacemaker: Secondary | ICD-10-CM | POA: Diagnosis not present

## 2015-11-14 DIAGNOSIS — E119 Type 2 diabetes mellitus without complications: Secondary | ICD-10-CM | POA: Diagnosis not present

## 2015-11-14 DIAGNOSIS — K219 Gastro-esophageal reflux disease without esophagitis: Secondary | ICD-10-CM | POA: Diagnosis not present

## 2015-11-14 DIAGNOSIS — I252 Old myocardial infarction: Secondary | ICD-10-CM | POA: Diagnosis not present

## 2015-11-14 DIAGNOSIS — E785 Hyperlipidemia, unspecified: Secondary | ICD-10-CM | POA: Diagnosis not present

## 2015-11-14 DIAGNOSIS — I2510 Atherosclerotic heart disease of native coronary artery without angina pectoris: Secondary | ICD-10-CM | POA: Diagnosis not present

## 2015-11-14 DIAGNOSIS — I6359 Cerebral infarction due to unspecified occlusion or stenosis of other cerebral artery: Secondary | ICD-10-CM | POA: Diagnosis not present

## 2015-11-14 DIAGNOSIS — I10 Essential (primary) hypertension: Secondary | ICD-10-CM | POA: Diagnosis not present

## 2015-11-28 DIAGNOSIS — N183 Chronic kidney disease, stage 3 (moderate): Secondary | ICD-10-CM | POA: Diagnosis not present

## 2015-11-28 DIAGNOSIS — E119 Type 2 diabetes mellitus without complications: Secondary | ICD-10-CM | POA: Diagnosis not present

## 2015-11-28 DIAGNOSIS — I5032 Chronic diastolic (congestive) heart failure: Secondary | ICD-10-CM | POA: Diagnosis not present

## 2015-11-28 DIAGNOSIS — F0391 Unspecified dementia with behavioral disturbance: Secondary | ICD-10-CM | POA: Diagnosis not present

## 2015-12-20 DIAGNOSIS — F0391 Unspecified dementia with behavioral disturbance: Secondary | ICD-10-CM | POA: Diagnosis not present

## 2015-12-20 DIAGNOSIS — Z7901 Long term (current) use of anticoagulants: Secondary | ICD-10-CM | POA: Diagnosis not present

## 2015-12-20 DIAGNOSIS — E1122 Type 2 diabetes mellitus with diabetic chronic kidney disease: Secondary | ICD-10-CM | POA: Diagnosis not present

## 2015-12-20 DIAGNOSIS — I5032 Chronic diastolic (congestive) heart failure: Secondary | ICD-10-CM | POA: Diagnosis not present

## 2015-12-20 DIAGNOSIS — N183 Chronic kidney disease, stage 3 (moderate): Secondary | ICD-10-CM | POA: Diagnosis not present

## 2015-12-24 DIAGNOSIS — Z5181 Encounter for therapeutic drug level monitoring: Secondary | ICD-10-CM | POA: Diagnosis not present

## 2015-12-24 DIAGNOSIS — Z7901 Long term (current) use of anticoagulants: Secondary | ICD-10-CM | POA: Diagnosis not present

## 2015-12-26 DIAGNOSIS — Z7901 Long term (current) use of anticoagulants: Secondary | ICD-10-CM | POA: Diagnosis not present

## 2015-12-26 DIAGNOSIS — E1122 Type 2 diabetes mellitus with diabetic chronic kidney disease: Secondary | ICD-10-CM | POA: Diagnosis not present

## 2015-12-26 DIAGNOSIS — I5032 Chronic diastolic (congestive) heart failure: Secondary | ICD-10-CM | POA: Diagnosis not present

## 2015-12-26 DIAGNOSIS — F0391 Unspecified dementia with behavioral disturbance: Secondary | ICD-10-CM | POA: Diagnosis not present

## 2015-12-26 DIAGNOSIS — N183 Chronic kidney disease, stage 3 (moderate): Secondary | ICD-10-CM | POA: Diagnosis not present

## 2015-12-31 DIAGNOSIS — Z7901 Long term (current) use of anticoagulants: Secondary | ICD-10-CM | POA: Diagnosis not present

## 2015-12-31 DIAGNOSIS — E1122 Type 2 diabetes mellitus with diabetic chronic kidney disease: Secondary | ICD-10-CM | POA: Diagnosis not present

## 2015-12-31 DIAGNOSIS — N183 Chronic kidney disease, stage 3 (moderate): Secondary | ICD-10-CM | POA: Diagnosis not present

## 2015-12-31 DIAGNOSIS — I5032 Chronic diastolic (congestive) heart failure: Secondary | ICD-10-CM | POA: Diagnosis not present

## 2015-12-31 DIAGNOSIS — F0391 Unspecified dementia with behavioral disturbance: Secondary | ICD-10-CM | POA: Diagnosis not present

## 2016-01-04 DIAGNOSIS — N183 Chronic kidney disease, stage 3 (moderate): Secondary | ICD-10-CM | POA: Diagnosis not present

## 2016-01-04 DIAGNOSIS — Z7901 Long term (current) use of anticoagulants: Secondary | ICD-10-CM | POA: Diagnosis not present

## 2016-01-04 DIAGNOSIS — F0391 Unspecified dementia with behavioral disturbance: Secondary | ICD-10-CM | POA: Diagnosis not present

## 2016-01-04 DIAGNOSIS — I5032 Chronic diastolic (congestive) heart failure: Secondary | ICD-10-CM | POA: Diagnosis not present

## 2016-01-04 DIAGNOSIS — E1122 Type 2 diabetes mellitus with diabetic chronic kidney disease: Secondary | ICD-10-CM | POA: Diagnosis not present

## 2016-01-10 DIAGNOSIS — F0391 Unspecified dementia with behavioral disturbance: Secondary | ICD-10-CM | POA: Diagnosis not present

## 2016-01-10 DIAGNOSIS — I5032 Chronic diastolic (congestive) heart failure: Secondary | ICD-10-CM | POA: Diagnosis not present

## 2016-01-10 DIAGNOSIS — E1122 Type 2 diabetes mellitus with diabetic chronic kidney disease: Secondary | ICD-10-CM | POA: Diagnosis not present

## 2016-01-10 DIAGNOSIS — N183 Chronic kidney disease, stage 3 (moderate): Secondary | ICD-10-CM | POA: Diagnosis not present

## 2016-01-10 DIAGNOSIS — Z7901 Long term (current) use of anticoagulants: Secondary | ICD-10-CM | POA: Diagnosis not present

## 2016-01-11 DIAGNOSIS — F0391 Unspecified dementia with behavioral disturbance: Secondary | ICD-10-CM | POA: Diagnosis not present

## 2016-01-11 DIAGNOSIS — E1122 Type 2 diabetes mellitus with diabetic chronic kidney disease: Secondary | ICD-10-CM | POA: Diagnosis not present

## 2016-01-11 DIAGNOSIS — Z7901 Long term (current) use of anticoagulants: Secondary | ICD-10-CM | POA: Diagnosis not present

## 2016-01-11 DIAGNOSIS — I5032 Chronic diastolic (congestive) heart failure: Secondary | ICD-10-CM | POA: Diagnosis not present

## 2016-01-11 DIAGNOSIS — N183 Chronic kidney disease, stage 3 (moderate): Secondary | ICD-10-CM | POA: Diagnosis not present

## 2016-01-14 DIAGNOSIS — R0989 Other specified symptoms and signs involving the circulatory and respiratory systems: Secondary | ICD-10-CM | POA: Diagnosis not present

## 2016-01-14 DIAGNOSIS — I517 Cardiomegaly: Secondary | ICD-10-CM | POA: Diagnosis not present

## 2016-01-15 DIAGNOSIS — I5032 Chronic diastolic (congestive) heart failure: Secondary | ICD-10-CM | POA: Diagnosis not present

## 2016-01-15 DIAGNOSIS — Z7901 Long term (current) use of anticoagulants: Secondary | ICD-10-CM | POA: Diagnosis not present

## 2016-01-15 DIAGNOSIS — N183 Chronic kidney disease, stage 3 (moderate): Secondary | ICD-10-CM | POA: Diagnosis not present

## 2016-01-15 DIAGNOSIS — F0391 Unspecified dementia with behavioral disturbance: Secondary | ICD-10-CM | POA: Diagnosis not present

## 2016-01-15 DIAGNOSIS — E1122 Type 2 diabetes mellitus with diabetic chronic kidney disease: Secondary | ICD-10-CM | POA: Diagnosis not present

## 2016-01-16 DIAGNOSIS — R609 Edema, unspecified: Secondary | ICD-10-CM | POA: Diagnosis not present

## 2016-01-16 DIAGNOSIS — I5032 Chronic diastolic (congestive) heart failure: Secondary | ICD-10-CM | POA: Diagnosis not present

## 2016-01-16 DIAGNOSIS — R0989 Other specified symptoms and signs involving the circulatory and respiratory systems: Secondary | ICD-10-CM | POA: Diagnosis not present

## 2016-02-16 DIAGNOSIS — I6359 Cerebral infarction due to unspecified occlusion or stenosis of other cerebral artery: Secondary | ICD-10-CM | POA: Diagnosis not present

## 2016-02-16 DIAGNOSIS — E785 Hyperlipidemia, unspecified: Secondary | ICD-10-CM | POA: Diagnosis not present

## 2016-02-16 DIAGNOSIS — I10 Essential (primary) hypertension: Secondary | ICD-10-CM | POA: Diagnosis not present

## 2016-02-16 DIAGNOSIS — Z950 Presence of cardiac pacemaker: Secondary | ICD-10-CM | POA: Diagnosis not present

## 2016-02-16 DIAGNOSIS — E119 Type 2 diabetes mellitus without complications: Secondary | ICD-10-CM | POA: Diagnosis not present

## 2016-02-16 DIAGNOSIS — I2510 Atherosclerotic heart disease of native coronary artery without angina pectoris: Secondary | ICD-10-CM | POA: Diagnosis not present

## 2016-02-16 DIAGNOSIS — I252 Old myocardial infarction: Secondary | ICD-10-CM | POA: Diagnosis not present

## 2016-02-16 DIAGNOSIS — K219 Gastro-esophageal reflux disease without esophagitis: Secondary | ICD-10-CM | POA: Diagnosis not present

## 2016-02-22 DIAGNOSIS — F0390 Unspecified dementia without behavioral disturbance: Secondary | ICD-10-CM | POA: Diagnosis not present

## 2016-02-22 DIAGNOSIS — I5032 Chronic diastolic (congestive) heart failure: Secondary | ICD-10-CM | POA: Diagnosis not present

## 2016-02-22 DIAGNOSIS — N183 Chronic kidney disease, stage 3 (moderate): Secondary | ICD-10-CM | POA: Diagnosis not present

## 2016-02-22 DIAGNOSIS — E119 Type 2 diabetes mellitus without complications: Secondary | ICD-10-CM | POA: Diagnosis not present

## 2016-04-09 DIAGNOSIS — M79622 Pain in left upper arm: Secondary | ICD-10-CM | POA: Diagnosis not present

## 2016-04-09 DIAGNOSIS — L02414 Cutaneous abscess of left upper limb: Secondary | ICD-10-CM | POA: Diagnosis not present

## 2016-04-22 DIAGNOSIS — M79602 Pain in left arm: Secondary | ICD-10-CM | POA: Diagnosis not present

## 2016-04-22 DIAGNOSIS — H04123 Dry eye syndrome of bilateral lacrimal glands: Secondary | ICD-10-CM | POA: Diagnosis not present

## 2016-04-22 DIAGNOSIS — L02414 Cutaneous abscess of left upper limb: Secondary | ICD-10-CM | POA: Diagnosis not present

## 2016-04-22 DIAGNOSIS — E119 Type 2 diabetes mellitus without complications: Secondary | ICD-10-CM | POA: Diagnosis not present

## 2016-04-23 DIAGNOSIS — I5032 Chronic diastolic (congestive) heart failure: Secondary | ICD-10-CM | POA: Diagnosis not present

## 2016-04-23 DIAGNOSIS — N183 Chronic kidney disease, stage 3 (moderate): Secondary | ICD-10-CM | POA: Diagnosis not present

## 2016-04-23 DIAGNOSIS — E1122 Type 2 diabetes mellitus with diabetic chronic kidney disease: Secondary | ICD-10-CM | POA: Diagnosis not present

## 2016-04-23 DIAGNOSIS — F0390 Unspecified dementia without behavioral disturbance: Secondary | ICD-10-CM | POA: Diagnosis not present

## 2016-04-23 DIAGNOSIS — L02414 Cutaneous abscess of left upper limb: Secondary | ICD-10-CM | POA: Diagnosis not present

## 2016-04-23 DIAGNOSIS — Z9181 History of falling: Secondary | ICD-10-CM | POA: Diagnosis not present

## 2016-04-25 DIAGNOSIS — I5032 Chronic diastolic (congestive) heart failure: Secondary | ICD-10-CM | POA: Diagnosis not present

## 2016-04-25 DIAGNOSIS — E1122 Type 2 diabetes mellitus with diabetic chronic kidney disease: Secondary | ICD-10-CM | POA: Diagnosis not present

## 2016-04-25 DIAGNOSIS — N183 Chronic kidney disease, stage 3 (moderate): Secondary | ICD-10-CM | POA: Diagnosis not present

## 2016-04-25 DIAGNOSIS — F0390 Unspecified dementia without behavioral disturbance: Secondary | ICD-10-CM | POA: Diagnosis not present

## 2016-04-25 DIAGNOSIS — L02414 Cutaneous abscess of left upper limb: Secondary | ICD-10-CM | POA: Diagnosis not present

## 2016-04-25 DIAGNOSIS — Z9181 History of falling: Secondary | ICD-10-CM | POA: Diagnosis not present

## 2016-05-01 DIAGNOSIS — N183 Chronic kidney disease, stage 3 (moderate): Secondary | ICD-10-CM | POA: Diagnosis not present

## 2016-05-01 DIAGNOSIS — E1122 Type 2 diabetes mellitus with diabetic chronic kidney disease: Secondary | ICD-10-CM | POA: Diagnosis not present

## 2016-05-01 DIAGNOSIS — I5032 Chronic diastolic (congestive) heart failure: Secondary | ICD-10-CM | POA: Diagnosis not present

## 2016-05-01 DIAGNOSIS — Z9181 History of falling: Secondary | ICD-10-CM | POA: Diagnosis not present

## 2016-05-01 DIAGNOSIS — L02414 Cutaneous abscess of left upper limb: Secondary | ICD-10-CM | POA: Diagnosis not present

## 2016-05-01 DIAGNOSIS — F0390 Unspecified dementia without behavioral disturbance: Secondary | ICD-10-CM | POA: Diagnosis not present

## 2016-05-06 DIAGNOSIS — L02414 Cutaneous abscess of left upper limb: Secondary | ICD-10-CM | POA: Diagnosis not present

## 2016-05-06 DIAGNOSIS — M79622 Pain in left upper arm: Secondary | ICD-10-CM | POA: Diagnosis not present

## 2016-05-07 DIAGNOSIS — E1122 Type 2 diabetes mellitus with diabetic chronic kidney disease: Secondary | ICD-10-CM | POA: Diagnosis not present

## 2016-05-07 DIAGNOSIS — Z9181 History of falling: Secondary | ICD-10-CM | POA: Diagnosis not present

## 2016-05-07 DIAGNOSIS — I5032 Chronic diastolic (congestive) heart failure: Secondary | ICD-10-CM | POA: Diagnosis not present

## 2016-05-07 DIAGNOSIS — L02414 Cutaneous abscess of left upper limb: Secondary | ICD-10-CM | POA: Diagnosis not present

## 2016-05-07 DIAGNOSIS — F0390 Unspecified dementia without behavioral disturbance: Secondary | ICD-10-CM | POA: Diagnosis not present

## 2016-05-07 DIAGNOSIS — N183 Chronic kidney disease, stage 3 (moderate): Secondary | ICD-10-CM | POA: Diagnosis not present

## 2016-05-13 DIAGNOSIS — E1122 Type 2 diabetes mellitus with diabetic chronic kidney disease: Secondary | ICD-10-CM | POA: Diagnosis not present

## 2016-05-13 DIAGNOSIS — L02414 Cutaneous abscess of left upper limb: Secondary | ICD-10-CM | POA: Diagnosis not present

## 2016-05-13 DIAGNOSIS — I5032 Chronic diastolic (congestive) heart failure: Secondary | ICD-10-CM | POA: Diagnosis not present

## 2016-05-13 DIAGNOSIS — N183 Chronic kidney disease, stage 3 (moderate): Secondary | ICD-10-CM | POA: Diagnosis not present

## 2016-05-13 DIAGNOSIS — Z9181 History of falling: Secondary | ICD-10-CM | POA: Diagnosis not present

## 2016-05-13 DIAGNOSIS — F0390 Unspecified dementia without behavioral disturbance: Secondary | ICD-10-CM | POA: Diagnosis not present

## 2016-05-15 DIAGNOSIS — S50811A Abrasion of right forearm, initial encounter: Secondary | ICD-10-CM | POA: Diagnosis not present

## 2016-05-15 DIAGNOSIS — L02414 Cutaneous abscess of left upper limb: Secondary | ICD-10-CM | POA: Diagnosis not present

## 2016-05-21 DIAGNOSIS — Z9181 History of falling: Secondary | ICD-10-CM | POA: Diagnosis not present

## 2016-05-21 DIAGNOSIS — I5032 Chronic diastolic (congestive) heart failure: Secondary | ICD-10-CM | POA: Diagnosis not present

## 2016-05-21 DIAGNOSIS — N183 Chronic kidney disease, stage 3 (moderate): Secondary | ICD-10-CM | POA: Diagnosis not present

## 2016-05-21 DIAGNOSIS — L02414 Cutaneous abscess of left upper limb: Secondary | ICD-10-CM | POA: Diagnosis not present

## 2016-05-21 DIAGNOSIS — E1122 Type 2 diabetes mellitus with diabetic chronic kidney disease: Secondary | ICD-10-CM | POA: Diagnosis not present

## 2016-05-21 DIAGNOSIS — F0390 Unspecified dementia without behavioral disturbance: Secondary | ICD-10-CM | POA: Diagnosis not present

## 2016-05-30 DIAGNOSIS — L02414 Cutaneous abscess of left upper limb: Secondary | ICD-10-CM | POA: Diagnosis not present

## 2016-05-30 DIAGNOSIS — N183 Chronic kidney disease, stage 3 (moderate): Secondary | ICD-10-CM | POA: Diagnosis not present

## 2016-05-30 DIAGNOSIS — Z9181 History of falling: Secondary | ICD-10-CM | POA: Diagnosis not present

## 2016-05-30 DIAGNOSIS — F0390 Unspecified dementia without behavioral disturbance: Secondary | ICD-10-CM | POA: Diagnosis not present

## 2016-05-30 DIAGNOSIS — E1122 Type 2 diabetes mellitus with diabetic chronic kidney disease: Secondary | ICD-10-CM | POA: Diagnosis not present

## 2016-05-30 DIAGNOSIS — I5032 Chronic diastolic (congestive) heart failure: Secondary | ICD-10-CM | POA: Diagnosis not present

## 2016-06-03 DIAGNOSIS — I5032 Chronic diastolic (congestive) heart failure: Secondary | ICD-10-CM | POA: Diagnosis not present

## 2016-06-03 DIAGNOSIS — F0391 Unspecified dementia with behavioral disturbance: Secondary | ICD-10-CM | POA: Diagnosis not present

## 2016-06-03 DIAGNOSIS — E1122 Type 2 diabetes mellitus with diabetic chronic kidney disease: Secondary | ICD-10-CM | POA: Diagnosis not present

## 2016-06-03 DIAGNOSIS — I110 Hypertensive heart disease with heart failure: Secondary | ICD-10-CM | POA: Diagnosis not present

## 2016-06-03 DIAGNOSIS — N183 Chronic kidney disease, stage 3 (moderate): Secondary | ICD-10-CM | POA: Diagnosis not present

## 2016-06-03 DIAGNOSIS — I10 Essential (primary) hypertension: Secondary | ICD-10-CM | POA: Diagnosis not present

## 2016-06-05 DIAGNOSIS — N183 Chronic kidney disease, stage 3 (moderate): Secondary | ICD-10-CM | POA: Diagnosis not present

## 2016-06-05 DIAGNOSIS — Z9181 History of falling: Secondary | ICD-10-CM | POA: Diagnosis not present

## 2016-06-05 DIAGNOSIS — I5032 Chronic diastolic (congestive) heart failure: Secondary | ICD-10-CM | POA: Diagnosis not present

## 2016-06-05 DIAGNOSIS — E1122 Type 2 diabetes mellitus with diabetic chronic kidney disease: Secondary | ICD-10-CM | POA: Diagnosis not present

## 2016-06-05 DIAGNOSIS — L02414 Cutaneous abscess of left upper limb: Secondary | ICD-10-CM | POA: Diagnosis not present

## 2016-06-05 DIAGNOSIS — F0390 Unspecified dementia without behavioral disturbance: Secondary | ICD-10-CM | POA: Diagnosis not present

## 2016-06-07 DIAGNOSIS — I6359 Cerebral infarction due to unspecified occlusion or stenosis of other cerebral artery: Secondary | ICD-10-CM | POA: Diagnosis not present

## 2016-06-07 DIAGNOSIS — Z950 Presence of cardiac pacemaker: Secondary | ICD-10-CM | POA: Diagnosis not present

## 2016-06-07 DIAGNOSIS — I252 Old myocardial infarction: Secondary | ICD-10-CM | POA: Diagnosis not present

## 2016-06-07 DIAGNOSIS — K219 Gastro-esophageal reflux disease without esophagitis: Secondary | ICD-10-CM | POA: Diagnosis not present

## 2016-06-07 DIAGNOSIS — I10 Essential (primary) hypertension: Secondary | ICD-10-CM | POA: Diagnosis not present

## 2016-06-07 DIAGNOSIS — I2510 Atherosclerotic heart disease of native coronary artery without angina pectoris: Secondary | ICD-10-CM | POA: Diagnosis not present

## 2016-06-07 DIAGNOSIS — E785 Hyperlipidemia, unspecified: Secondary | ICD-10-CM | POA: Diagnosis not present

## 2016-06-07 DIAGNOSIS — E119 Type 2 diabetes mellitus without complications: Secondary | ICD-10-CM | POA: Diagnosis not present

## 2016-06-13 DIAGNOSIS — N183 Chronic kidney disease, stage 3 (moderate): Secondary | ICD-10-CM | POA: Diagnosis not present

## 2016-06-13 DIAGNOSIS — L02414 Cutaneous abscess of left upper limb: Secondary | ICD-10-CM | POA: Diagnosis not present

## 2016-06-13 DIAGNOSIS — F0390 Unspecified dementia without behavioral disturbance: Secondary | ICD-10-CM | POA: Diagnosis not present

## 2016-06-13 DIAGNOSIS — E1122 Type 2 diabetes mellitus with diabetic chronic kidney disease: Secondary | ICD-10-CM | POA: Diagnosis not present

## 2016-06-13 DIAGNOSIS — I5032 Chronic diastolic (congestive) heart failure: Secondary | ICD-10-CM | POA: Diagnosis not present

## 2016-06-13 DIAGNOSIS — Z9181 History of falling: Secondary | ICD-10-CM | POA: Diagnosis not present

## 2016-06-20 DIAGNOSIS — E1122 Type 2 diabetes mellitus with diabetic chronic kidney disease: Secondary | ICD-10-CM | POA: Diagnosis not present

## 2016-06-20 DIAGNOSIS — N183 Chronic kidney disease, stage 3 (moderate): Secondary | ICD-10-CM | POA: Diagnosis not present

## 2016-06-20 DIAGNOSIS — Z9181 History of falling: Secondary | ICD-10-CM | POA: Diagnosis not present

## 2016-06-20 DIAGNOSIS — F0390 Unspecified dementia without behavioral disturbance: Secondary | ICD-10-CM | POA: Diagnosis not present

## 2016-06-20 DIAGNOSIS — I5032 Chronic diastolic (congestive) heart failure: Secondary | ICD-10-CM | POA: Diagnosis not present

## 2016-06-20 DIAGNOSIS — L02414 Cutaneous abscess of left upper limb: Secondary | ICD-10-CM | POA: Diagnosis not present

## 2016-07-08 DIAGNOSIS — I129 Hypertensive chronic kidney disease with stage 1 through stage 4 chronic kidney disease, or unspecified chronic kidney disease: Secondary | ICD-10-CM | POA: Diagnosis not present

## 2016-07-08 DIAGNOSIS — D72829 Elevated white blood cell count, unspecified: Secondary | ICD-10-CM | POA: Diagnosis not present

## 2016-07-08 DIAGNOSIS — I5032 Chronic diastolic (congestive) heart failure: Secondary | ICD-10-CM | POA: Diagnosis not present

## 2016-07-08 DIAGNOSIS — L495 Exfoliation due to erythematous condition involving 50-59 percent of body surface: Secondary | ICD-10-CM | POA: Diagnosis not present

## 2016-07-08 DIAGNOSIS — F0391 Unspecified dementia with behavioral disturbance: Secondary | ICD-10-CM | POA: Diagnosis not present

## 2016-07-08 DIAGNOSIS — D649 Anemia, unspecified: Secondary | ICD-10-CM | POA: Diagnosis not present

## 2016-07-08 DIAGNOSIS — I442 Atrioventricular block, complete: Secondary | ICD-10-CM | POA: Diagnosis not present

## 2016-07-08 DIAGNOSIS — E119 Type 2 diabetes mellitus without complications: Secondary | ICD-10-CM | POA: Diagnosis not present

## 2016-07-18 DIAGNOSIS — M79674 Pain in right toe(s): Secondary | ICD-10-CM | POA: Diagnosis not present

## 2016-07-18 DIAGNOSIS — B351 Tinea unguium: Secondary | ICD-10-CM | POA: Diagnosis not present

## 2016-07-18 DIAGNOSIS — I739 Peripheral vascular disease, unspecified: Secondary | ICD-10-CM | POA: Diagnosis not present

## 2016-07-18 DIAGNOSIS — R262 Difficulty in walking, not elsewhere classified: Secondary | ICD-10-CM | POA: Diagnosis not present

## 2016-08-01 DIAGNOSIS — R32 Unspecified urinary incontinence: Secondary | ICD-10-CM | POA: Diagnosis not present

## 2016-09-11 DIAGNOSIS — N183 Chronic kidney disease, stage 3 (moderate): Secondary | ICD-10-CM | POA: Diagnosis not present

## 2016-09-11 DIAGNOSIS — E119 Type 2 diabetes mellitus without complications: Secondary | ICD-10-CM | POA: Diagnosis not present

## 2016-09-11 DIAGNOSIS — F0390 Unspecified dementia without behavioral disturbance: Secondary | ICD-10-CM | POA: Diagnosis not present

## 2016-09-11 DIAGNOSIS — D649 Anemia, unspecified: Secondary | ICD-10-CM | POA: Diagnosis not present

## 2016-09-11 DIAGNOSIS — I509 Heart failure, unspecified: Secondary | ICD-10-CM | POA: Diagnosis not present

## 2016-09-11 DIAGNOSIS — Z9181 History of falling: Secondary | ICD-10-CM | POA: Diagnosis not present

## 2016-09-28 DIAGNOSIS — E785 Hyperlipidemia, unspecified: Secondary | ICD-10-CM | POA: Diagnosis not present

## 2016-09-28 DIAGNOSIS — I2510 Atherosclerotic heart disease of native coronary artery without angina pectoris: Secondary | ICD-10-CM | POA: Diagnosis not present

## 2016-09-28 DIAGNOSIS — I6359 Cerebral infarction due to unspecified occlusion or stenosis of other cerebral artery: Secondary | ICD-10-CM | POA: Diagnosis not present

## 2016-09-28 DIAGNOSIS — K219 Gastro-esophageal reflux disease without esophagitis: Secondary | ICD-10-CM | POA: Diagnosis not present

## 2016-09-28 DIAGNOSIS — Z950 Presence of cardiac pacemaker: Secondary | ICD-10-CM | POA: Diagnosis not present

## 2016-09-28 DIAGNOSIS — E119 Type 2 diabetes mellitus without complications: Secondary | ICD-10-CM | POA: Diagnosis not present

## 2016-09-28 DIAGNOSIS — I10 Essential (primary) hypertension: Secondary | ICD-10-CM | POA: Diagnosis not present

## 2016-09-28 DIAGNOSIS — I252 Old myocardial infarction: Secondary | ICD-10-CM | POA: Diagnosis not present

## 2016-09-30 DIAGNOSIS — R32 Unspecified urinary incontinence: Secondary | ICD-10-CM | POA: Diagnosis not present

## 2016-10-10 DIAGNOSIS — E1122 Type 2 diabetes mellitus with diabetic chronic kidney disease: Secondary | ICD-10-CM | POA: Diagnosis not present

## 2016-10-10 DIAGNOSIS — I5032 Chronic diastolic (congestive) heart failure: Secondary | ICD-10-CM | POA: Diagnosis not present

## 2016-10-10 DIAGNOSIS — N183 Chronic kidney disease, stage 3 (moderate): Secondary | ICD-10-CM | POA: Diagnosis not present

## 2016-10-10 DIAGNOSIS — F0391 Unspecified dementia with behavioral disturbance: Secondary | ICD-10-CM | POA: Diagnosis not present

## 2016-10-10 DIAGNOSIS — I130 Hypertensive heart and chronic kidney disease with heart failure and stage 1 through stage 4 chronic kidney disease, or unspecified chronic kidney disease: Secondary | ICD-10-CM | POA: Diagnosis not present

## 2016-10-10 DIAGNOSIS — S0992XD Unspecified injury of nose, subsequent encounter: Secondary | ICD-10-CM | POA: Diagnosis not present

## 2016-10-13 DIAGNOSIS — I5032 Chronic diastolic (congestive) heart failure: Secondary | ICD-10-CM | POA: Diagnosis not present

## 2016-10-13 DIAGNOSIS — S0990XA Unspecified injury of head, initial encounter: Secondary | ICD-10-CM | POA: Diagnosis not present

## 2016-10-13 DIAGNOSIS — S01112A Laceration without foreign body of left eyelid and periocular area, initial encounter: Secondary | ICD-10-CM | POA: Diagnosis not present

## 2016-10-13 DIAGNOSIS — F0390 Unspecified dementia without behavioral disturbance: Secondary | ICD-10-CM | POA: Diagnosis not present

## 2016-10-13 DIAGNOSIS — Z79899 Other long term (current) drug therapy: Secondary | ICD-10-CM | POA: Diagnosis not present

## 2016-10-13 DIAGNOSIS — E119 Type 2 diabetes mellitus without complications: Secondary | ICD-10-CM | POA: Diagnosis not present

## 2016-10-13 DIAGNOSIS — Z7951 Long term (current) use of inhaled steroids: Secondary | ICD-10-CM | POA: Diagnosis not present

## 2016-10-13 DIAGNOSIS — Z7902 Long term (current) use of antithrombotics/antiplatelets: Secondary | ICD-10-CM | POA: Diagnosis not present

## 2016-10-13 DIAGNOSIS — Z043 Encounter for examination and observation following other accident: Secondary | ICD-10-CM | POA: Diagnosis not present

## 2016-10-13 DIAGNOSIS — S0093XA Contusion of unspecified part of head, initial encounter: Secondary | ICD-10-CM | POA: Diagnosis not present

## 2016-10-14 DIAGNOSIS — S72002A Fracture of unspecified part of neck of left femur, initial encounter for closed fracture: Secondary | ICD-10-CM | POA: Diagnosis not present

## 2016-10-14 DIAGNOSIS — S0990XA Unspecified injury of head, initial encounter: Secondary | ICD-10-CM | POA: Diagnosis not present

## 2016-10-14 DIAGNOSIS — W19XXXA Unspecified fall, initial encounter: Secondary | ICD-10-CM | POA: Diagnosis not present

## 2016-10-14 DIAGNOSIS — M79605 Pain in left leg: Secondary | ICD-10-CM | POA: Diagnosis not present

## 2016-10-14 DIAGNOSIS — Z96652 Presence of left artificial knee joint: Secondary | ICD-10-CM | POA: Diagnosis not present

## 2016-10-14 DIAGNOSIS — M6281 Muscle weakness (generalized): Secondary | ICD-10-CM | POA: Diagnosis not present

## 2016-10-15 DIAGNOSIS — F0390 Unspecified dementia without behavioral disturbance: Secondary | ICD-10-CM | POA: Diagnosis not present

## 2016-10-15 DIAGNOSIS — Z882 Allergy status to sulfonamides status: Secondary | ICD-10-CM | POA: Diagnosis not present

## 2016-10-15 DIAGNOSIS — Z9181 History of falling: Secondary | ICD-10-CM | POA: Diagnosis not present

## 2016-10-15 DIAGNOSIS — Z881 Allergy status to other antibiotic agents status: Secondary | ICD-10-CM | POA: Diagnosis not present

## 2016-10-15 DIAGNOSIS — R296 Repeated falls: Secondary | ICD-10-CM | POA: Diagnosis not present

## 2016-10-15 DIAGNOSIS — Z66 Do not resuscitate: Secondary | ICD-10-CM | POA: Diagnosis present

## 2016-10-15 DIAGNOSIS — S72012A Unspecified intracapsular fracture of left femur, initial encounter for closed fracture: Secondary | ICD-10-CM | POA: Diagnosis not present

## 2016-10-15 DIAGNOSIS — M25552 Pain in left hip: Secondary | ICD-10-CM | POA: Diagnosis not present

## 2016-10-15 DIAGNOSIS — I110 Hypertensive heart disease with heart failure: Secondary | ICD-10-CM | POA: Diagnosis not present

## 2016-10-15 DIAGNOSIS — M6281 Muscle weakness (generalized): Secondary | ICD-10-CM | POA: Diagnosis not present

## 2016-10-15 DIAGNOSIS — Z7902 Long term (current) use of antithrombotics/antiplatelets: Secondary | ICD-10-CM | POA: Diagnosis not present

## 2016-10-15 DIAGNOSIS — N390 Urinary tract infection, site not specified: Secondary | ICD-10-CM | POA: Diagnosis not present

## 2016-10-15 DIAGNOSIS — D72829 Elevated white blood cell count, unspecified: Secondary | ICD-10-CM | POA: Diagnosis not present

## 2016-10-15 DIAGNOSIS — S72002A Fracture of unspecified part of neck of left femur, initial encounter for closed fracture: Secondary | ICD-10-CM | POA: Diagnosis not present

## 2016-10-15 DIAGNOSIS — R278 Other lack of coordination: Secondary | ICD-10-CM | POA: Diagnosis not present

## 2016-10-15 DIAGNOSIS — I5032 Chronic diastolic (congestive) heart failure: Secondary | ICD-10-CM | POA: Diagnosis not present

## 2016-10-15 DIAGNOSIS — Z01810 Encounter for preprocedural cardiovascular examination: Secondary | ICD-10-CM | POA: Diagnosis not present

## 2016-10-15 DIAGNOSIS — Z950 Presence of cardiac pacemaker: Secondary | ICD-10-CM | POA: Diagnosis not present

## 2016-10-15 DIAGNOSIS — I2510 Atherosclerotic heart disease of native coronary artery without angina pectoris: Secondary | ICD-10-CM | POA: Diagnosis not present

## 2016-10-15 DIAGNOSIS — Z888 Allergy status to other drugs, medicaments and biological substances status: Secondary | ICD-10-CM | POA: Diagnosis not present

## 2016-10-15 DIAGNOSIS — R4182 Altered mental status, unspecified: Secondary | ICD-10-CM | POA: Diagnosis not present

## 2016-10-15 DIAGNOSIS — S72002D Fracture of unspecified part of neck of left femur, subsequent encounter for closed fracture with routine healing: Secondary | ICD-10-CM | POA: Diagnosis not present

## 2016-10-15 DIAGNOSIS — Z79899 Other long term (current) drug therapy: Secondary | ICD-10-CM | POA: Diagnosis not present

## 2016-10-15 DIAGNOSIS — R531 Weakness: Secondary | ICD-10-CM | POA: Diagnosis not present

## 2016-10-15 DIAGNOSIS — R2689 Other abnormalities of gait and mobility: Secondary | ICD-10-CM | POA: Diagnosis not present

## 2016-10-15 DIAGNOSIS — Z8673 Personal history of transient ischemic attack (TIA), and cerebral infarction without residual deficits: Secondary | ICD-10-CM | POA: Diagnosis not present

## 2016-10-15 DIAGNOSIS — Z8679 Personal history of other diseases of the circulatory system: Secondary | ICD-10-CM | POA: Diagnosis not present

## 2016-10-15 DIAGNOSIS — R2681 Unsteadiness on feet: Secondary | ICD-10-CM | POA: Diagnosis not present

## 2016-10-15 DIAGNOSIS — S299XXA Unspecified injury of thorax, initial encounter: Secondary | ICD-10-CM | POA: Diagnosis not present

## 2016-10-15 DIAGNOSIS — B9620 Unspecified Escherichia coli [E. coli] as the cause of diseases classified elsewhere: Secondary | ICD-10-CM | POA: Diagnosis present

## 2016-10-15 DIAGNOSIS — S92153A Displaced avulsion fracture (chip fracture) of unspecified talus, initial encounter for closed fracture: Secondary | ICD-10-CM | POA: Diagnosis not present

## 2016-10-15 DIAGNOSIS — E119 Type 2 diabetes mellitus without complications: Secondary | ICD-10-CM | POA: Diagnosis not present

## 2016-10-20 DIAGNOSIS — Z743 Need for continuous supervision: Secondary | ICD-10-CM | POA: Diagnosis not present

## 2016-10-20 DIAGNOSIS — F0391 Unspecified dementia with behavioral disturbance: Secondary | ICD-10-CM | POA: Diagnosis not present

## 2016-10-20 DIAGNOSIS — R54 Age-related physical debility: Secondary | ICD-10-CM | POA: Diagnosis not present

## 2016-10-20 DIAGNOSIS — S0180XA Unspecified open wound of other part of head, initial encounter: Secondary | ICD-10-CM | POA: Diagnosis not present

## 2016-10-20 DIAGNOSIS — M6281 Muscle weakness (generalized): Secondary | ICD-10-CM | POA: Diagnosis not present

## 2016-10-20 DIAGNOSIS — S72002D Fracture of unspecified part of neck of left femur, subsequent encounter for closed fracture with routine healing: Secondary | ICD-10-CM | POA: Diagnosis not present

## 2016-10-20 DIAGNOSIS — S0191XA Laceration without foreign body of unspecified part of head, initial encounter: Secondary | ICD-10-CM | POA: Diagnosis not present

## 2016-10-20 DIAGNOSIS — I5032 Chronic diastolic (congestive) heart failure: Secondary | ICD-10-CM | POA: Diagnosis not present

## 2016-10-20 DIAGNOSIS — N183 Chronic kidney disease, stage 3 (moderate): Secondary | ICD-10-CM | POA: Diagnosis not present

## 2016-10-20 DIAGNOSIS — R531 Weakness: Secondary | ICD-10-CM | POA: Diagnosis not present

## 2016-10-20 DIAGNOSIS — Z950 Presence of cardiac pacemaker: Secondary | ICD-10-CM | POA: Diagnosis not present

## 2016-10-20 DIAGNOSIS — R197 Diarrhea, unspecified: Secondary | ICD-10-CM | POA: Diagnosis not present

## 2016-10-20 DIAGNOSIS — R2689 Other abnormalities of gait and mobility: Secondary | ICD-10-CM | POA: Diagnosis not present

## 2016-10-20 DIAGNOSIS — R4182 Altered mental status, unspecified: Secondary | ICD-10-CM | POA: Diagnosis not present

## 2016-10-20 DIAGNOSIS — Z043 Encounter for examination and observation following other accident: Secondary | ICD-10-CM | POA: Diagnosis not present

## 2016-10-20 DIAGNOSIS — S92153A Displaced avulsion fracture (chip fracture) of unspecified talus, initial encounter for closed fracture: Secondary | ICD-10-CM | POA: Diagnosis not present

## 2016-10-20 DIAGNOSIS — G301 Alzheimer's disease with late onset: Secondary | ICD-10-CM | POA: Diagnosis not present

## 2016-10-20 DIAGNOSIS — E119 Type 2 diabetes mellitus without complications: Secondary | ICD-10-CM | POA: Diagnosis not present

## 2016-10-20 DIAGNOSIS — S0990XA Unspecified injury of head, initial encounter: Secondary | ICD-10-CM | POA: Diagnosis not present

## 2016-10-20 DIAGNOSIS — Z6821 Body mass index (BMI) 21.0-21.9, adult: Secondary | ICD-10-CM | POA: Diagnosis not present

## 2016-10-20 DIAGNOSIS — A0472 Enterocolitis due to Clostridium difficile, not specified as recurrent: Secondary | ICD-10-CM | POA: Diagnosis not present

## 2016-10-20 DIAGNOSIS — R2681 Unsteadiness on feet: Secondary | ICD-10-CM | POA: Diagnosis not present

## 2016-10-20 DIAGNOSIS — S0031XA Abrasion of nose, initial encounter: Secondary | ICD-10-CM | POA: Diagnosis not present

## 2016-10-20 DIAGNOSIS — S0081XA Abrasion of other part of head, initial encounter: Secondary | ICD-10-CM | POA: Diagnosis not present

## 2016-10-20 DIAGNOSIS — R296 Repeated falls: Secondary | ICD-10-CM | POA: Diagnosis not present

## 2016-10-20 DIAGNOSIS — S0181XA Laceration without foreign body of other part of head, initial encounter: Secondary | ICD-10-CM | POA: Diagnosis not present

## 2016-10-20 DIAGNOSIS — Z7401 Bed confinement status: Secondary | ICD-10-CM | POA: Diagnosis not present

## 2016-10-20 DIAGNOSIS — I129 Hypertensive chronic kidney disease with stage 1 through stage 4 chronic kidney disease, or unspecified chronic kidney disease: Secondary | ICD-10-CM | POA: Diagnosis not present

## 2016-10-20 DIAGNOSIS — R279 Unspecified lack of coordination: Secondary | ICD-10-CM | POA: Diagnosis not present

## 2016-10-20 DIAGNOSIS — S0181XD Laceration without foreign body of other part of head, subsequent encounter: Secondary | ICD-10-CM | POA: Diagnosis not present

## 2016-10-20 DIAGNOSIS — F0390 Unspecified dementia without behavioral disturbance: Secondary | ICD-10-CM | POA: Diagnosis not present

## 2016-10-20 DIAGNOSIS — R278 Other lack of coordination: Secondary | ICD-10-CM | POA: Diagnosis not present

## 2016-10-20 DIAGNOSIS — Z9181 History of falling: Secondary | ICD-10-CM | POA: Diagnosis not present

## 2016-10-20 DIAGNOSIS — D72829 Elevated white blood cell count, unspecified: Secondary | ICD-10-CM | POA: Diagnosis not present

## 2016-10-20 DIAGNOSIS — R58 Hemorrhage, not elsewhere classified: Secondary | ICD-10-CM | POA: Diagnosis not present

## 2016-10-20 DIAGNOSIS — R5381 Other malaise: Secondary | ICD-10-CM | POA: Diagnosis not present

## 2016-10-21 DIAGNOSIS — I5032 Chronic diastolic (congestive) heart failure: Secondary | ICD-10-CM | POA: Diagnosis not present

## 2016-10-21 DIAGNOSIS — G301 Alzheimer's disease with late onset: Secondary | ICD-10-CM | POA: Diagnosis not present

## 2016-10-21 DIAGNOSIS — R5381 Other malaise: Secondary | ICD-10-CM | POA: Diagnosis not present

## 2016-10-21 DIAGNOSIS — S72002D Fracture of unspecified part of neck of left femur, subsequent encounter for closed fracture with routine healing: Secondary | ICD-10-CM | POA: Diagnosis not present

## 2016-10-22 DIAGNOSIS — E119 Type 2 diabetes mellitus without complications: Secondary | ICD-10-CM | POA: Diagnosis not present

## 2016-10-22 DIAGNOSIS — S0081XA Abrasion of other part of head, initial encounter: Secondary | ICD-10-CM | POA: Diagnosis not present

## 2016-10-23 DIAGNOSIS — S0181XD Laceration without foreign body of other part of head, subsequent encounter: Secondary | ICD-10-CM | POA: Diagnosis not present

## 2016-10-23 DIAGNOSIS — S0181XA Laceration without foreign body of other part of head, initial encounter: Secondary | ICD-10-CM | POA: Diagnosis not present

## 2016-10-29 DIAGNOSIS — Z9181 History of falling: Secondary | ICD-10-CM | POA: Diagnosis not present

## 2016-10-29 DIAGNOSIS — S0031XA Abrasion of nose, initial encounter: Secondary | ICD-10-CM | POA: Diagnosis not present

## 2016-10-29 DIAGNOSIS — Z043 Encounter for examination and observation following other accident: Secondary | ICD-10-CM | POA: Diagnosis not present

## 2016-10-29 DIAGNOSIS — S0191XA Laceration without foreign body of unspecified part of head, initial encounter: Secondary | ICD-10-CM | POA: Diagnosis not present

## 2016-11-04 DIAGNOSIS — D72829 Elevated white blood cell count, unspecified: Secondary | ICD-10-CM | POA: Diagnosis not present

## 2016-11-04 DIAGNOSIS — R296 Repeated falls: Secondary | ICD-10-CM | POA: Diagnosis not present

## 2016-11-04 DIAGNOSIS — Z950 Presence of cardiac pacemaker: Secondary | ICD-10-CM | POA: Diagnosis not present

## 2016-11-04 DIAGNOSIS — F0390 Unspecified dementia without behavioral disturbance: Secondary | ICD-10-CM | POA: Diagnosis not present

## 2016-11-06 DIAGNOSIS — A0472 Enterocolitis due to Clostridium difficile, not specified as recurrent: Secondary | ICD-10-CM | POA: Diagnosis not present

## 2016-11-06 DIAGNOSIS — R197 Diarrhea, unspecified: Secondary | ICD-10-CM | POA: Diagnosis not present

## 2016-11-06 DIAGNOSIS — R54 Age-related physical debility: Secondary | ICD-10-CM | POA: Diagnosis not present

## 2016-11-13 DIAGNOSIS — Z6821 Body mass index (BMI) 21.0-21.9, adult: Secondary | ICD-10-CM | POA: Diagnosis not present

## 2016-11-13 DIAGNOSIS — A0472 Enterocolitis due to Clostridium difficile, not specified as recurrent: Secondary | ICD-10-CM | POA: Diagnosis not present

## 2016-11-13 DIAGNOSIS — N183 Chronic kidney disease, stage 3 (moderate): Secondary | ICD-10-CM | POA: Diagnosis not present

## 2016-11-13 DIAGNOSIS — I5032 Chronic diastolic (congestive) heart failure: Secondary | ICD-10-CM | POA: Diagnosis not present

## 2016-11-13 DIAGNOSIS — I129 Hypertensive chronic kidney disease with stage 1 through stage 4 chronic kidney disease, or unspecified chronic kidney disease: Secondary | ICD-10-CM | POA: Diagnosis not present

## 2016-11-13 DIAGNOSIS — F0391 Unspecified dementia with behavioral disturbance: Secondary | ICD-10-CM | POA: Diagnosis not present

## 2016-11-28 DIAGNOSIS — S72002D Fracture of unspecified part of neck of left femur, subsequent encounter for closed fracture with routine healing: Secondary | ICD-10-CM | POA: Diagnosis not present

## 2016-12-04 DIAGNOSIS — R54 Age-related physical debility: Secondary | ICD-10-CM | POA: Diagnosis not present

## 2016-12-04 DIAGNOSIS — L304 Erythema intertrigo: Secondary | ICD-10-CM | POA: Diagnosis not present

## 2016-12-04 DIAGNOSIS — M6281 Muscle weakness (generalized): Secondary | ICD-10-CM | POA: Diagnosis not present

## 2016-12-04 DIAGNOSIS — R600 Localized edema: Secondary | ICD-10-CM | POA: Diagnosis not present

## 2016-12-04 DIAGNOSIS — M25551 Pain in right hip: Secondary | ICD-10-CM | POA: Diagnosis not present

## 2016-12-04 DIAGNOSIS — R269 Unspecified abnormalities of gait and mobility: Secondary | ICD-10-CM | POA: Diagnosis not present

## 2016-12-04 DIAGNOSIS — A0471 Enterocolitis due to Clostridium difficile, recurrent: Secondary | ICD-10-CM | POA: Diagnosis not present

## 2016-12-11 DIAGNOSIS — Z79899 Other long term (current) drug therapy: Secondary | ICD-10-CM | POA: Diagnosis not present

## 2016-12-18 DIAGNOSIS — R54 Age-related physical debility: Secondary | ICD-10-CM | POA: Diagnosis not present

## 2016-12-18 DIAGNOSIS — E559 Vitamin D deficiency, unspecified: Secondary | ICD-10-CM | POA: Diagnosis not present

## 2016-12-19 DIAGNOSIS — K219 Gastro-esophageal reflux disease without esophagitis: Secondary | ICD-10-CM | POA: Diagnosis not present

## 2016-12-19 DIAGNOSIS — I2510 Atherosclerotic heart disease of native coronary artery without angina pectoris: Secondary | ICD-10-CM | POA: Diagnosis not present

## 2016-12-19 DIAGNOSIS — E785 Hyperlipidemia, unspecified: Secondary | ICD-10-CM | POA: Diagnosis not present

## 2016-12-19 DIAGNOSIS — Z950 Presence of cardiac pacemaker: Secondary | ICD-10-CM | POA: Diagnosis not present

## 2016-12-19 DIAGNOSIS — I6359 Cerebral infarction due to unspecified occlusion or stenosis of other cerebral artery: Secondary | ICD-10-CM | POA: Diagnosis not present

## 2016-12-19 DIAGNOSIS — E119 Type 2 diabetes mellitus without complications: Secondary | ICD-10-CM | POA: Diagnosis not present

## 2016-12-19 DIAGNOSIS — I252 Old myocardial infarction: Secondary | ICD-10-CM | POA: Diagnosis not present

## 2016-12-19 DIAGNOSIS — I10 Essential (primary) hypertension: Secondary | ICD-10-CM | POA: Diagnosis not present

## 2017-01-01 DIAGNOSIS — F0390 Unspecified dementia without behavioral disturbance: Secondary | ICD-10-CM | POA: Diagnosis not present

## 2017-01-01 DIAGNOSIS — Z8673 Personal history of transient ischemic attack (TIA), and cerebral infarction without residual deficits: Secondary | ICD-10-CM | POA: Diagnosis not present

## 2017-01-01 DIAGNOSIS — R1312 Dysphagia, oropharyngeal phase: Secondary | ICD-10-CM | POA: Diagnosis not present

## 2017-01-02 DIAGNOSIS — R1312 Dysphagia, oropharyngeal phase: Secondary | ICD-10-CM | POA: Diagnosis not present

## 2017-01-02 DIAGNOSIS — F0390 Unspecified dementia without behavioral disturbance: Secondary | ICD-10-CM | POA: Diagnosis not present

## 2017-01-02 DIAGNOSIS — Z8673 Personal history of transient ischemic attack (TIA), and cerebral infarction without residual deficits: Secondary | ICD-10-CM | POA: Diagnosis not present

## 2017-01-05 DIAGNOSIS — R1312 Dysphagia, oropharyngeal phase: Secondary | ICD-10-CM | POA: Diagnosis not present

## 2017-01-05 DIAGNOSIS — F0390 Unspecified dementia without behavioral disturbance: Secondary | ICD-10-CM | POA: Diagnosis not present

## 2017-01-05 DIAGNOSIS — Z8673 Personal history of transient ischemic attack (TIA), and cerebral infarction without residual deficits: Secondary | ICD-10-CM | POA: Diagnosis not present

## 2017-01-06 DIAGNOSIS — R1312 Dysphagia, oropharyngeal phase: Secondary | ICD-10-CM | POA: Diagnosis not present

## 2017-01-06 DIAGNOSIS — F0390 Unspecified dementia without behavioral disturbance: Secondary | ICD-10-CM | POA: Diagnosis not present

## 2017-01-06 DIAGNOSIS — Z8673 Personal history of transient ischemic attack (TIA), and cerebral infarction without residual deficits: Secondary | ICD-10-CM | POA: Diagnosis not present

## 2017-01-07 DIAGNOSIS — R1312 Dysphagia, oropharyngeal phase: Secondary | ICD-10-CM | POA: Diagnosis not present

## 2017-01-07 DIAGNOSIS — F0390 Unspecified dementia without behavioral disturbance: Secondary | ICD-10-CM | POA: Diagnosis not present

## 2017-01-07 DIAGNOSIS — Z8673 Personal history of transient ischemic attack (TIA), and cerebral infarction without residual deficits: Secondary | ICD-10-CM | POA: Diagnosis not present

## 2017-01-08 DIAGNOSIS — Z8673 Personal history of transient ischemic attack (TIA), and cerebral infarction without residual deficits: Secondary | ICD-10-CM | POA: Diagnosis not present

## 2017-01-08 DIAGNOSIS — E1122 Type 2 diabetes mellitus with diabetic chronic kidney disease: Secondary | ICD-10-CM | POA: Diagnosis not present

## 2017-01-08 DIAGNOSIS — I5032 Chronic diastolic (congestive) heart failure: Secondary | ICD-10-CM | POA: Diagnosis not present

## 2017-01-08 DIAGNOSIS — F0390 Unspecified dementia without behavioral disturbance: Secondary | ICD-10-CM | POA: Diagnosis not present

## 2017-01-08 DIAGNOSIS — I129 Hypertensive chronic kidney disease with stage 1 through stage 4 chronic kidney disease, or unspecified chronic kidney disease: Secondary | ICD-10-CM | POA: Diagnosis not present

## 2017-01-08 DIAGNOSIS — R1312 Dysphagia, oropharyngeal phase: Secondary | ICD-10-CM | POA: Diagnosis not present

## 2017-01-08 DIAGNOSIS — N183 Chronic kidney disease, stage 3 (moderate): Secondary | ICD-10-CM | POA: Diagnosis not present

## 2017-01-09 DIAGNOSIS — Z8673 Personal history of transient ischemic attack (TIA), and cerebral infarction without residual deficits: Secondary | ICD-10-CM | POA: Diagnosis not present

## 2017-01-09 DIAGNOSIS — F0390 Unspecified dementia without behavioral disturbance: Secondary | ICD-10-CM | POA: Diagnosis not present

## 2017-01-09 DIAGNOSIS — R1312 Dysphagia, oropharyngeal phase: Secondary | ICD-10-CM | POA: Diagnosis not present

## 2017-01-12 DIAGNOSIS — R1312 Dysphagia, oropharyngeal phase: Secondary | ICD-10-CM | POA: Diagnosis not present

## 2017-01-12 DIAGNOSIS — Z8673 Personal history of transient ischemic attack (TIA), and cerebral infarction without residual deficits: Secondary | ICD-10-CM | POA: Diagnosis not present

## 2017-01-12 DIAGNOSIS — F0390 Unspecified dementia without behavioral disturbance: Secondary | ICD-10-CM | POA: Diagnosis not present

## 2017-01-13 DIAGNOSIS — F0390 Unspecified dementia without behavioral disturbance: Secondary | ICD-10-CM | POA: Diagnosis not present

## 2017-01-13 DIAGNOSIS — R1312 Dysphagia, oropharyngeal phase: Secondary | ICD-10-CM | POA: Diagnosis not present

## 2017-01-13 DIAGNOSIS — Z8673 Personal history of transient ischemic attack (TIA), and cerebral infarction without residual deficits: Secondary | ICD-10-CM | POA: Diagnosis not present

## 2017-01-14 DIAGNOSIS — R1312 Dysphagia, oropharyngeal phase: Secondary | ICD-10-CM | POA: Diagnosis not present

## 2017-01-14 DIAGNOSIS — F0390 Unspecified dementia without behavioral disturbance: Secondary | ICD-10-CM | POA: Diagnosis not present

## 2017-01-14 DIAGNOSIS — Z8673 Personal history of transient ischemic attack (TIA), and cerebral infarction without residual deficits: Secondary | ICD-10-CM | POA: Diagnosis not present

## 2017-01-15 DIAGNOSIS — F0390 Unspecified dementia without behavioral disturbance: Secondary | ICD-10-CM | POA: Diagnosis not present

## 2017-01-15 DIAGNOSIS — Z8673 Personal history of transient ischemic attack (TIA), and cerebral infarction without residual deficits: Secondary | ICD-10-CM | POA: Diagnosis not present

## 2017-01-15 DIAGNOSIS — R1312 Dysphagia, oropharyngeal phase: Secondary | ICD-10-CM | POA: Diagnosis not present

## 2017-01-16 DIAGNOSIS — F0390 Unspecified dementia without behavioral disturbance: Secondary | ICD-10-CM | POA: Diagnosis not present

## 2017-01-16 DIAGNOSIS — Z79899 Other long term (current) drug therapy: Secondary | ICD-10-CM | POA: Diagnosis not present

## 2017-01-16 DIAGNOSIS — Z8673 Personal history of transient ischemic attack (TIA), and cerebral infarction without residual deficits: Secondary | ICD-10-CM | POA: Diagnosis not present

## 2017-01-16 DIAGNOSIS — R1312 Dysphagia, oropharyngeal phase: Secondary | ICD-10-CM | POA: Diagnosis not present

## 2017-01-19 DIAGNOSIS — R1312 Dysphagia, oropharyngeal phase: Secondary | ICD-10-CM | POA: Diagnosis not present

## 2017-01-19 DIAGNOSIS — F0390 Unspecified dementia without behavioral disturbance: Secondary | ICD-10-CM | POA: Diagnosis not present

## 2017-01-19 DIAGNOSIS — Z8673 Personal history of transient ischemic attack (TIA), and cerebral infarction without residual deficits: Secondary | ICD-10-CM | POA: Diagnosis not present

## 2017-01-20 DIAGNOSIS — R1312 Dysphagia, oropharyngeal phase: Secondary | ICD-10-CM | POA: Diagnosis not present

## 2017-01-20 DIAGNOSIS — F0390 Unspecified dementia without behavioral disturbance: Secondary | ICD-10-CM | POA: Diagnosis not present

## 2017-01-20 DIAGNOSIS — Z8673 Personal history of transient ischemic attack (TIA), and cerebral infarction without residual deficits: Secondary | ICD-10-CM | POA: Diagnosis not present

## 2017-01-21 DIAGNOSIS — F0390 Unspecified dementia without behavioral disturbance: Secondary | ICD-10-CM | POA: Diagnosis not present

## 2017-01-21 DIAGNOSIS — R1312 Dysphagia, oropharyngeal phase: Secondary | ICD-10-CM | POA: Diagnosis not present

## 2017-01-21 DIAGNOSIS — Z8673 Personal history of transient ischemic attack (TIA), and cerebral infarction without residual deficits: Secondary | ICD-10-CM | POA: Diagnosis not present

## 2017-01-22 DIAGNOSIS — Z8673 Personal history of transient ischemic attack (TIA), and cerebral infarction without residual deficits: Secondary | ICD-10-CM | POA: Diagnosis not present

## 2017-01-22 DIAGNOSIS — F0390 Unspecified dementia without behavioral disturbance: Secondary | ICD-10-CM | POA: Diagnosis not present

## 2017-01-22 DIAGNOSIS — R1312 Dysphagia, oropharyngeal phase: Secondary | ICD-10-CM | POA: Diagnosis not present

## 2017-01-23 DIAGNOSIS — F0390 Unspecified dementia without behavioral disturbance: Secondary | ICD-10-CM | POA: Diagnosis not present

## 2017-01-23 DIAGNOSIS — R1312 Dysphagia, oropharyngeal phase: Secondary | ICD-10-CM | POA: Diagnosis not present

## 2017-01-23 DIAGNOSIS — Z8673 Personal history of transient ischemic attack (TIA), and cerebral infarction without residual deficits: Secondary | ICD-10-CM | POA: Diagnosis not present

## 2017-01-26 DIAGNOSIS — R2689 Other abnormalities of gait and mobility: Secondary | ICD-10-CM | POA: Diagnosis not present

## 2017-01-26 DIAGNOSIS — R2681 Unsteadiness on feet: Secondary | ICD-10-CM | POA: Diagnosis not present

## 2017-01-26 DIAGNOSIS — M6281 Muscle weakness (generalized): Secondary | ICD-10-CM | POA: Diagnosis not present

## 2017-01-26 DIAGNOSIS — Z8673 Personal history of transient ischemic attack (TIA), and cerebral infarction without residual deficits: Secondary | ICD-10-CM | POA: Diagnosis not present

## 2017-01-26 DIAGNOSIS — R1312 Dysphagia, oropharyngeal phase: Secondary | ICD-10-CM | POA: Diagnosis not present

## 2017-01-26 DIAGNOSIS — F0390 Unspecified dementia without behavioral disturbance: Secondary | ICD-10-CM | POA: Diagnosis not present

## 2017-01-26 DIAGNOSIS — R296 Repeated falls: Secondary | ICD-10-CM | POA: Diagnosis not present

## 2017-01-27 DIAGNOSIS — F0390 Unspecified dementia without behavioral disturbance: Secondary | ICD-10-CM | POA: Diagnosis not present

## 2017-01-27 DIAGNOSIS — R1312 Dysphagia, oropharyngeal phase: Secondary | ICD-10-CM | POA: Diagnosis not present

## 2017-01-27 DIAGNOSIS — R2681 Unsteadiness on feet: Secondary | ICD-10-CM | POA: Diagnosis not present

## 2017-01-27 DIAGNOSIS — M6281 Muscle weakness (generalized): Secondary | ICD-10-CM | POA: Diagnosis not present

## 2017-01-27 DIAGNOSIS — Z8673 Personal history of transient ischemic attack (TIA), and cerebral infarction without residual deficits: Secondary | ICD-10-CM | POA: Diagnosis not present

## 2017-01-27 DIAGNOSIS — R296 Repeated falls: Secondary | ICD-10-CM | POA: Diagnosis not present

## 2017-01-28 DIAGNOSIS — R296 Repeated falls: Secondary | ICD-10-CM | POA: Diagnosis not present

## 2017-01-28 DIAGNOSIS — R1312 Dysphagia, oropharyngeal phase: Secondary | ICD-10-CM | POA: Diagnosis not present

## 2017-01-28 DIAGNOSIS — M6281 Muscle weakness (generalized): Secondary | ICD-10-CM | POA: Diagnosis not present

## 2017-01-28 DIAGNOSIS — Z8673 Personal history of transient ischemic attack (TIA), and cerebral infarction without residual deficits: Secondary | ICD-10-CM | POA: Diagnosis not present

## 2017-01-28 DIAGNOSIS — R2681 Unsteadiness on feet: Secondary | ICD-10-CM | POA: Diagnosis not present

## 2017-01-28 DIAGNOSIS — F0390 Unspecified dementia without behavioral disturbance: Secondary | ICD-10-CM | POA: Diagnosis not present

## 2017-01-29 DIAGNOSIS — F0390 Unspecified dementia without behavioral disturbance: Secondary | ICD-10-CM | POA: Diagnosis not present

## 2017-01-29 DIAGNOSIS — R1312 Dysphagia, oropharyngeal phase: Secondary | ICD-10-CM | POA: Diagnosis not present

## 2017-01-29 DIAGNOSIS — Z8673 Personal history of transient ischemic attack (TIA), and cerebral infarction without residual deficits: Secondary | ICD-10-CM | POA: Diagnosis not present

## 2017-01-29 DIAGNOSIS — R296 Repeated falls: Secondary | ICD-10-CM | POA: Diagnosis not present

## 2017-01-29 DIAGNOSIS — R2681 Unsteadiness on feet: Secondary | ICD-10-CM | POA: Diagnosis not present

## 2017-01-29 DIAGNOSIS — M6281 Muscle weakness (generalized): Secondary | ICD-10-CM | POA: Diagnosis not present

## 2017-02-02 DIAGNOSIS — E119 Type 2 diabetes mellitus without complications: Secondary | ICD-10-CM | POA: Diagnosis not present

## 2017-02-02 DIAGNOSIS — I5032 Chronic diastolic (congestive) heart failure: Secondary | ICD-10-CM | POA: Diagnosis not present

## 2017-02-02 DIAGNOSIS — I110 Hypertensive heart disease with heart failure: Secondary | ICD-10-CM | POA: Diagnosis not present

## 2017-02-02 DIAGNOSIS — S51801A Unspecified open wound of right forearm, initial encounter: Secondary | ICD-10-CM | POA: Diagnosis not present

## 2017-02-02 DIAGNOSIS — S41109A Unspecified open wound of unspecified upper arm, initial encounter: Secondary | ICD-10-CM | POA: Diagnosis not present

## 2017-02-02 DIAGNOSIS — Z8673 Personal history of transient ischemic attack (TIA), and cerebral infarction without residual deficits: Secondary | ICD-10-CM | POA: Diagnosis not present

## 2017-02-02 DIAGNOSIS — Z7982 Long term (current) use of aspirin: Secondary | ICD-10-CM | POA: Diagnosis not present

## 2017-02-02 DIAGNOSIS — S50811A Abrasion of right forearm, initial encounter: Secondary | ICD-10-CM | POA: Diagnosis not present

## 2017-02-02 DIAGNOSIS — Z79899 Other long term (current) drug therapy: Secondary | ICD-10-CM | POA: Diagnosis not present

## 2017-02-02 DIAGNOSIS — R58 Hemorrhage, not elsewhere classified: Secondary | ICD-10-CM | POA: Diagnosis not present

## 2017-02-13 DIAGNOSIS — I2510 Atherosclerotic heart disease of native coronary artery without angina pectoris: Secondary | ICD-10-CM | POA: Diagnosis not present

## 2017-02-13 DIAGNOSIS — Z950 Presence of cardiac pacemaker: Secondary | ICD-10-CM | POA: Diagnosis not present

## 2017-02-13 DIAGNOSIS — K219 Gastro-esophageal reflux disease without esophagitis: Secondary | ICD-10-CM | POA: Diagnosis not present

## 2017-02-13 DIAGNOSIS — I6359 Cerebral infarction due to unspecified occlusion or stenosis of other cerebral artery: Secondary | ICD-10-CM | POA: Diagnosis not present

## 2017-02-13 DIAGNOSIS — I252 Old myocardial infarction: Secondary | ICD-10-CM | POA: Diagnosis not present

## 2017-02-13 DIAGNOSIS — E785 Hyperlipidemia, unspecified: Secondary | ICD-10-CM | POA: Diagnosis not present

## 2017-02-13 DIAGNOSIS — E119 Type 2 diabetes mellitus without complications: Secondary | ICD-10-CM | POA: Diagnosis not present

## 2017-02-13 DIAGNOSIS — I10 Essential (primary) hypertension: Secondary | ICD-10-CM | POA: Diagnosis not present

## 2017-02-16 DIAGNOSIS — F0390 Unspecified dementia without behavioral disturbance: Secondary | ICD-10-CM | POA: Diagnosis not present

## 2017-02-16 DIAGNOSIS — M6281 Muscle weakness (generalized): Secondary | ICD-10-CM | POA: Diagnosis not present

## 2017-02-16 DIAGNOSIS — R1312 Dysphagia, oropharyngeal phase: Secondary | ICD-10-CM | POA: Diagnosis not present

## 2017-02-16 DIAGNOSIS — R296 Repeated falls: Secondary | ICD-10-CM | POA: Diagnosis not present

## 2017-02-16 DIAGNOSIS — R2681 Unsteadiness on feet: Secondary | ICD-10-CM | POA: Diagnosis not present

## 2017-02-16 DIAGNOSIS — Z8673 Personal history of transient ischemic attack (TIA), and cerebral infarction without residual deficits: Secondary | ICD-10-CM | POA: Diagnosis not present

## 2017-02-17 DIAGNOSIS — M6281 Muscle weakness (generalized): Secondary | ICD-10-CM | POA: Diagnosis not present

## 2017-02-17 DIAGNOSIS — Z8673 Personal history of transient ischemic attack (TIA), and cerebral infarction without residual deficits: Secondary | ICD-10-CM | POA: Diagnosis not present

## 2017-02-17 DIAGNOSIS — F0390 Unspecified dementia without behavioral disturbance: Secondary | ICD-10-CM | POA: Diagnosis not present

## 2017-02-17 DIAGNOSIS — R1312 Dysphagia, oropharyngeal phase: Secondary | ICD-10-CM | POA: Diagnosis not present

## 2017-02-17 DIAGNOSIS — R2681 Unsteadiness on feet: Secondary | ICD-10-CM | POA: Diagnosis not present

## 2017-02-17 DIAGNOSIS — R296 Repeated falls: Secondary | ICD-10-CM | POA: Diagnosis not present

## 2017-02-18 DIAGNOSIS — R1312 Dysphagia, oropharyngeal phase: Secondary | ICD-10-CM | POA: Diagnosis not present

## 2017-02-18 DIAGNOSIS — R296 Repeated falls: Secondary | ICD-10-CM | POA: Diagnosis not present

## 2017-02-18 DIAGNOSIS — R2681 Unsteadiness on feet: Secondary | ICD-10-CM | POA: Diagnosis not present

## 2017-02-18 DIAGNOSIS — Z8673 Personal history of transient ischemic attack (TIA), and cerebral infarction without residual deficits: Secondary | ICD-10-CM | POA: Diagnosis not present

## 2017-02-18 DIAGNOSIS — F0390 Unspecified dementia without behavioral disturbance: Secondary | ICD-10-CM | POA: Diagnosis not present

## 2017-02-18 DIAGNOSIS — M6281 Muscle weakness (generalized): Secondary | ICD-10-CM | POA: Diagnosis not present

## 2017-02-19 DIAGNOSIS — R1312 Dysphagia, oropharyngeal phase: Secondary | ICD-10-CM | POA: Diagnosis not present

## 2017-02-19 DIAGNOSIS — R2681 Unsteadiness on feet: Secondary | ICD-10-CM | POA: Diagnosis not present

## 2017-02-19 DIAGNOSIS — M6281 Muscle weakness (generalized): Secondary | ICD-10-CM | POA: Diagnosis not present

## 2017-02-19 DIAGNOSIS — F0390 Unspecified dementia without behavioral disturbance: Secondary | ICD-10-CM | POA: Diagnosis not present

## 2017-02-19 DIAGNOSIS — Z8673 Personal history of transient ischemic attack (TIA), and cerebral infarction without residual deficits: Secondary | ICD-10-CM | POA: Diagnosis not present

## 2017-02-19 DIAGNOSIS — R296 Repeated falls: Secondary | ICD-10-CM | POA: Diagnosis not present

## 2017-02-20 DIAGNOSIS — R1312 Dysphagia, oropharyngeal phase: Secondary | ICD-10-CM | POA: Diagnosis not present

## 2017-02-20 DIAGNOSIS — R2681 Unsteadiness on feet: Secondary | ICD-10-CM | POA: Diagnosis not present

## 2017-02-20 DIAGNOSIS — M6281 Muscle weakness (generalized): Secondary | ICD-10-CM | POA: Diagnosis not present

## 2017-02-20 DIAGNOSIS — Z79899 Other long term (current) drug therapy: Secondary | ICD-10-CM | POA: Diagnosis not present

## 2017-02-20 DIAGNOSIS — Z8673 Personal history of transient ischemic attack (TIA), and cerebral infarction without residual deficits: Secondary | ICD-10-CM | POA: Diagnosis not present

## 2017-02-20 DIAGNOSIS — F0390 Unspecified dementia without behavioral disturbance: Secondary | ICD-10-CM | POA: Diagnosis not present

## 2017-02-20 DIAGNOSIS — R296 Repeated falls: Secondary | ICD-10-CM | POA: Diagnosis not present

## 2017-02-23 DIAGNOSIS — M6281 Muscle weakness (generalized): Secondary | ICD-10-CM | POA: Diagnosis not present

## 2017-02-23 DIAGNOSIS — F0390 Unspecified dementia without behavioral disturbance: Secondary | ICD-10-CM | POA: Diagnosis not present

## 2017-02-23 DIAGNOSIS — Z8673 Personal history of transient ischemic attack (TIA), and cerebral infarction without residual deficits: Secondary | ICD-10-CM | POA: Diagnosis not present

## 2017-02-23 DIAGNOSIS — R296 Repeated falls: Secondary | ICD-10-CM | POA: Diagnosis not present

## 2017-02-23 DIAGNOSIS — R1312 Dysphagia, oropharyngeal phase: Secondary | ICD-10-CM | POA: Diagnosis not present

## 2017-02-23 DIAGNOSIS — R2681 Unsteadiness on feet: Secondary | ICD-10-CM | POA: Diagnosis not present

## 2017-02-24 DIAGNOSIS — R1312 Dysphagia, oropharyngeal phase: Secondary | ICD-10-CM | POA: Diagnosis not present

## 2017-02-24 DIAGNOSIS — Z8673 Personal history of transient ischemic attack (TIA), and cerebral infarction without residual deficits: Secondary | ICD-10-CM | POA: Diagnosis not present

## 2017-02-24 DIAGNOSIS — R2681 Unsteadiness on feet: Secondary | ICD-10-CM | POA: Diagnosis not present

## 2017-02-24 DIAGNOSIS — R296 Repeated falls: Secondary | ICD-10-CM | POA: Diagnosis not present

## 2017-02-24 DIAGNOSIS — F0390 Unspecified dementia without behavioral disturbance: Secondary | ICD-10-CM | POA: Diagnosis not present

## 2017-02-24 DIAGNOSIS — M6281 Muscle weakness (generalized): Secondary | ICD-10-CM | POA: Diagnosis not present

## 2017-02-27 DIAGNOSIS — E119 Type 2 diabetes mellitus without complications: Secondary | ICD-10-CM | POA: Diagnosis not present

## 2017-04-27 DEATH — deceased
